# Patient Record
Sex: Male | Born: 1935
Health system: Southern US, Community
[De-identification: ages and names within clinical notes are randomized; demographics above are authoritative.]

## PROBLEM LIST (undated history)

## (undated) DIAGNOSIS — I2510 Atherosclerotic heart disease of native coronary artery without angina pectoris: Secondary | ICD-10-CM

## (undated) DIAGNOSIS — I471 Supraventricular tachycardia, unspecified: Secondary | ICD-10-CM

## (undated) DIAGNOSIS — Z9889 Other specified postprocedural states: Secondary | ICD-10-CM

## (undated) DIAGNOSIS — C801 Malignant (primary) neoplasm, unspecified: Secondary | ICD-10-CM

## (undated) DIAGNOSIS — D126 Benign neoplasm of colon, unspecified: Secondary | ICD-10-CM

## (undated) DIAGNOSIS — M109 Gout, unspecified: Secondary | ICD-10-CM

## (undated) DIAGNOSIS — Z9049 Acquired absence of other specified parts of digestive tract: Secondary | ICD-10-CM

## (undated) DIAGNOSIS — C4491 Basal cell carcinoma of skin, unspecified: Secondary | ICD-10-CM

## (undated) DIAGNOSIS — K589 Irritable bowel syndrome without diarrhea: Secondary | ICD-10-CM

## (undated) DIAGNOSIS — I10 Essential (primary) hypertension: Secondary | ICD-10-CM

## (undated) DIAGNOSIS — M1990 Unspecified osteoarthritis, unspecified site: Secondary | ICD-10-CM

## (undated) DIAGNOSIS — I219 Acute myocardial infarction, unspecified: Secondary | ICD-10-CM

## (undated) HISTORY — PX: CARDIAC CATHETERIZATION: SHX172

## (undated) HISTORY — DX: Benign neoplasm of colon, unspecified: D12.6

## (undated) HISTORY — PX: APPENDECTOMY: SHX54

## (undated) HISTORY — DX: Other specified postprocedural states: Z98.890

## (undated) HISTORY — PX: OTHER SURGICAL HISTORY: SHX169

## (undated) HISTORY — PX: CHOLECYSTECTOMY: SHX55

## (undated) HISTORY — DX: Acquired absence of other specified parts of digestive tract: Z90.49

---

## 1998-05-23 ENCOUNTER — Other Ambulatory Visit: Admission: RE | Admit: 1998-05-23 | Discharge: 1998-05-23 | Payer: Self-pay | Admitting: Urology

## 1999-06-17 ENCOUNTER — Encounter: Payer: Self-pay | Admitting: Family Medicine

## 1999-06-17 ENCOUNTER — Encounter: Admission: RE | Admit: 1999-06-17 | Discharge: 1999-06-17 | Payer: Self-pay | Admitting: Family Medicine

## 1999-07-07 ENCOUNTER — Encounter: Admission: RE | Admit: 1999-07-07 | Discharge: 1999-07-07 | Payer: Self-pay | Admitting: Orthopedic Surgery

## 1999-07-07 ENCOUNTER — Encounter: Payer: Self-pay | Admitting: Orthopedic Surgery

## 2002-06-14 ENCOUNTER — Encounter: Payer: Self-pay | Admitting: Family Medicine

## 2002-06-14 ENCOUNTER — Encounter: Admission: RE | Admit: 2002-06-14 | Discharge: 2002-06-14 | Payer: Self-pay | Admitting: Family Medicine

## 2003-08-03 ENCOUNTER — Ambulatory Visit (HOSPITAL_COMMUNITY): Admission: RE | Admit: 2003-08-03 | Discharge: 2003-08-03 | Payer: Self-pay | Admitting: Gastroenterology

## 2006-10-19 ENCOUNTER — Encounter: Admission: RE | Admit: 2006-10-19 | Discharge: 2006-10-19 | Payer: Self-pay | Admitting: Family Medicine

## 2006-10-22 ENCOUNTER — Encounter: Admission: RE | Admit: 2006-10-22 | Discharge: 2006-10-22 | Payer: Self-pay | Admitting: Family Medicine

## 2006-11-22 ENCOUNTER — Encounter (INDEPENDENT_AMBULATORY_CARE_PROVIDER_SITE_OTHER): Payer: Self-pay | Admitting: Specialist

## 2006-11-22 ENCOUNTER — Inpatient Hospital Stay (HOSPITAL_COMMUNITY): Admission: RE | Admit: 2006-11-22 | Discharge: 2006-11-25 | Payer: Self-pay | Admitting: Urology

## 2008-05-05 ENCOUNTER — Emergency Department (HOSPITAL_COMMUNITY): Admission: EM | Admit: 2008-05-05 | Discharge: 2008-05-05 | Payer: Self-pay | Admitting: Emergency Medicine

## 2008-05-18 ENCOUNTER — Encounter: Admission: RE | Admit: 2008-05-18 | Discharge: 2008-05-18 | Payer: Self-pay | Admitting: Family Medicine

## 2008-08-20 DIAGNOSIS — D126 Benign neoplasm of colon, unspecified: Secondary | ICD-10-CM

## 2008-08-20 HISTORY — DX: Benign neoplasm of colon, unspecified: D12.6

## 2008-11-24 ENCOUNTER — Emergency Department (HOSPITAL_COMMUNITY): Admission: EM | Admit: 2008-11-24 | Discharge: 2008-11-24 | Payer: Self-pay | Admitting: Emergency Medicine

## 2009-02-28 ENCOUNTER — Encounter: Admission: RE | Admit: 2009-02-28 | Discharge: 2009-04-04 | Payer: Self-pay | Admitting: Family Medicine

## 2010-04-21 ENCOUNTER — Inpatient Hospital Stay (HOSPITAL_COMMUNITY): Admission: RE | Admit: 2010-04-21 | Discharge: 2010-04-24 | Payer: Self-pay | Admitting: Interventional Cardiology

## 2010-06-17 ENCOUNTER — Ambulatory Visit (HOSPITAL_COMMUNITY)
Admission: RE | Admit: 2010-06-17 | Discharge: 2010-06-18 | Payer: Self-pay | Source: Home / Self Care | Admitting: Interventional Cardiology

## 2010-06-27 ENCOUNTER — Emergency Department (HOSPITAL_COMMUNITY)
Admission: EM | Admit: 2010-06-27 | Discharge: 2010-06-27 | Payer: Self-pay | Source: Home / Self Care | Admitting: Emergency Medicine

## 2010-09-30 LAB — BASIC METABOLIC PANEL
BUN: 7 mg/dL (ref 6–23)
CO2: 33 mEq/L — ABNORMAL HIGH (ref 19–32)
Calcium: 8.8 mg/dL (ref 8.4–10.5)
Chloride: 98 mEq/L (ref 96–112)
Creatinine, Ser: 0.97 mg/dL (ref 0.4–1.5)
GFR calc Af Amer: >60 mL/min (ref >60)
GFR calc non Af Amer: >60 mL/min (ref >60)
Glucose, Bld: 100 mg/dL — ABNORMAL HIGH (ref 70–99)
Potassium: 3.9 mEq/L (ref 3.5–5.1)
Sodium: 135 mEq/L (ref 135–145)

## 2010-09-30 LAB — CBC
HCT: 35.8 % — ABNORMAL LOW (ref 39.0–52.0)
Hemoglobin: 12 g/dL — ABNORMAL LOW (ref 13.0–17.0)
MCH: 30.5 pg (ref 26.0–34.0)
MCHC: 33.5 g/dL (ref 30.0–36.0)
MCV: 91.1 fL (ref 78.0–100.0)
Platelets: 133 10*3/uL — ABNORMAL LOW (ref 150–400)
RBC: 3.93 MIL/uL — ABNORMAL LOW (ref 4.22–5.81)
RDW: 13.6 % (ref 11.5–15.5)
WBC: 6.1 10*3/uL (ref 4.0–10.5)

## 2010-10-02 LAB — BRAIN NATRIURETIC PEPTIDE: Pro B Natriuretic peptide (BNP): 270 pg/mL — ABNORMAL HIGH (ref 0.0–100.0)

## 2010-10-02 LAB — LIPID PANEL
Cholesterol: 112 mg/dL (ref 0–200)
HDL: 40 mg/dL (ref >39)
LDL Cholesterol: 58 mg/dL (ref 0–99)
Total CHOL/HDL Ratio: 2.8 RATIO
Triglycerides: 69 mg/dL (ref <150)
VLDL: 14 mg/dL (ref 0–40)

## 2010-10-02 LAB — CBC
HCT: 38.2 % — ABNORMAL LOW (ref 39.0–52.0)
HCT: 40.2 % (ref 39.0–52.0)
Hemoglobin: 12.7 g/dL — ABNORMAL LOW (ref 13.0–17.0)
Hemoglobin: 13.3 g/dL (ref 13.0–17.0)
MCH: 30.2 pg (ref 26.0–34.0)
MCH: 30.2 pg (ref 26.0–34.0)
MCHC: 33.1 g/dL (ref 30.0–36.0)
MCHC: 33.2 g/dL (ref 30.0–36.0)
MCV: 91 fL (ref 78.0–100.0)
MCV: 91.4 fL (ref 78.0–100.0)
Platelets: 129 10*3/uL — ABNORMAL LOW (ref 150–400)
Platelets: 137 10*3/uL — ABNORMAL LOW (ref 150–400)
RBC: 4.2 MIL/uL — ABNORMAL LOW (ref 4.22–5.81)
RBC: 4.4 MIL/uL (ref 4.22–5.81)
RDW: 13.5 % (ref 11.5–15.5)
RDW: 13.5 % (ref 11.5–15.5)
WBC: 7.1 10*3/uL (ref 4.0–10.5)
WBC: 7.3 10*3/uL (ref 4.0–10.5)

## 2010-10-02 LAB — BASIC METABOLIC PANEL
BUN: 15 mg/dL (ref 6–23)
BUN: 16 mg/dL (ref 6–23)
CO2: 32 mEq/L (ref 19–32)
CO2: 33 mEq/L — ABNORMAL HIGH (ref 19–32)
Calcium: 8.7 mg/dL (ref 8.4–10.5)
Calcium: 8.9 mg/dL (ref 8.4–10.5)
Chloride: 98 mEq/L (ref 96–112)
Chloride: 98 mEq/L (ref 96–112)
Creatinine, Ser: 0.78 mg/dL (ref 0.4–1.5)
Creatinine, Ser: 0.79 mg/dL (ref 0.4–1.5)
GFR calc Af Amer: >60 mL/min (ref >60)
GFR calc Af Amer: >60 mL/min (ref >60)
GFR calc non Af Amer: >60 mL/min (ref >60)
GFR calc non Af Amer: >60 mL/min (ref >60)
Glucose, Bld: 94 mg/dL (ref 70–99)
Glucose, Bld: 99 mg/dL (ref 70–99)
Potassium: 3.1 mEq/L — ABNORMAL LOW (ref 3.5–5.1)
Potassium: 3.3 mEq/L — ABNORMAL LOW (ref 3.5–5.1)
Sodium: 137 mEq/L (ref 135–145)
Sodium: 139 mEq/L (ref 135–145)

## 2010-10-02 LAB — POCT I-STAT, CHEM 8
BUN: 18 mg/dL (ref 6–23)
Calcium, Ion: 1.16 mmol/L (ref 1.12–1.32)
Chloride: 98 mEq/L (ref 96–112)
Creatinine, Ser: 0.8 mg/dL (ref 0.4–1.5)
Glucose, Bld: 147 mg/dL — ABNORMAL HIGH (ref 70–99)
HCT: 42 % (ref 39.0–52.0)
Hemoglobin: 14.3 g/dL (ref 13.0–17.0)
Potassium: 3.4 mEq/L — ABNORMAL LOW (ref 3.5–5.1)
Sodium: 139 mEq/L (ref 135–145)
TCO2: 30 mmol/L (ref 0–100)

## 2010-10-02 LAB — CARDIAC PANEL(CRET KIN+CKTOT+MB+TROPI)
CK, MB: 111.3 ng/mL (ref 0.3–4.0)
CK, MB: 151.4 ng/mL (ref 0.3–4.0)
Relative Index: 8.9 — ABNORMAL HIGH (ref 0.0–2.5)
Relative Index: 9.3 — ABNORMAL HIGH (ref 0.0–2.5)
Total CK: 1254 U/L — ABNORMAL HIGH (ref 7–232)
Total CK: 1636 U/L — ABNORMAL HIGH (ref 7–232)
Troponin I: 46.68 ng/mL (ref 0.00–0.06)
Troponin I: 91.72 ng/mL (ref 0.00–0.06)

## 2010-10-02 LAB — COMPREHENSIVE METABOLIC PANEL
ALT: 41 U/L (ref 0–53)
AST: 99 U/L — ABNORMAL HIGH (ref 0–37)
Albumin: 3.3 g/dL — ABNORMAL LOW (ref 3.5–5.2)
Alkaline Phosphatase: 62 U/L (ref 39–117)
BUN: 10 mg/dL (ref 6–23)
CO2: 35 mEq/L — ABNORMAL HIGH (ref 19–32)
Calcium: 8.8 mg/dL (ref 8.4–10.5)
Chloride: 97 mEq/L (ref 96–112)
Creatinine, Ser: 0.89 mg/dL (ref 0.4–1.5)
GFR calc Af Amer: >60 mL/min (ref >60)
GFR calc non Af Amer: >60 mL/min (ref >60)
Glucose, Bld: 99 mg/dL (ref 70–99)
Potassium: 3.5 mEq/L (ref 3.5–5.1)
Sodium: 138 mEq/L (ref 135–145)
Total Bilirubin: 1.3 mg/dL — ABNORMAL HIGH (ref 0.3–1.2)
Total Protein: 5.8 g/dL — ABNORMAL LOW (ref 6.0–8.3)

## 2010-10-02 LAB — PROTIME-INR
INR: 1.06 (ref 0.00–1.49)
Prothrombin Time: 14 seconds (ref 11.6–15.2)

## 2010-10-02 LAB — MRSA PCR SCREENING: MRSA by PCR: NEGATIVE

## 2010-12-05 NOTE — Op Note (Signed)
NAME:  Jeffrey Zimmerman, Jeffrey Zimmerman                          ACCOUNT NO.:  [id number]   MEDICAL RECORD NO.:  [id number]                   PATIENT TYPE:  AMB   LOCATION:  ENDO                                 FACILITY:  [facility]   PHYSICIAN:  James L. Malon Kindle., M.D.          DATE OF BIRTH:  22-Feb-1936   DATE OF PROCEDURE:  08/03/2003  DATE OF DISCHARGE:                                 OPERATIVE REPORT   PROCEDURE PERFORMED:  Colonoscopy.   ENDOSCOPIST:  Llana Aliment. Edwards, M.D.   MEDICATIONS:  Fentanyl 70 mcg, Versed 7 mg IV.   INSTRUMENT USED:  Pediatric Olympus video colonoscope.   INDICATIONS FOR PROCEDURE:  Strong family history of colon cancer.  Father  died of colon cancer __________   DESCRIPTION OF PROCEDURE:  The procedure had been explained to the patient  and consent obtained.  With the patient in the left lateral decubitus  position, the pediatric adjustable Olympus video colonoscope was inserted  and advanced.  The prep was excellent and we were able to reach the cecum.  The ileocecal valve and appendiceal orifice were seen.  The scope was  withdrawn and the cecum, ascending colon, transverse colon, descending and  sigmoid colon were seen well.  No polyps or other lesions were seen.  The  scope was withdrawn.  The patient tolerated the procedure well.   ASSESSMENT:  Strong family history of colon cancer, B16.0 with negative exam  this year.   PLAN:  Will recommend yearly Hemoccults, repeat colonoscopy in five years.                                               James L. Malon Kindle., M.D.    Waldron Session  D:  08/03/2003  T:  08/03/2003  Job:  [id number]   cc:   Bryan Lemma. Manus Gunning, M.D.  [address] [place]  [place] [address]  Fax: [phone]

## 2010-12-05 NOTE — Op Note (Signed)
NAME:  Jeffrey Zimmerman, Jeffrey Zimmerman                ACCOUNT NO.:  [id number]   MEDICAL RECORD NO.:  [id number]          PATIENT TYPE:  INP   LOCATION:  0008                         FACILITY:  [facility]   PHYSICIAN:  Sigmund I. Patsi Sears, M.D.DATE OF BIRTH:  08/30/1935   DATE OF PROCEDURE:  11/22/2006  DATE OF DISCHARGE:                               OPERATIVE REPORT   SERVICE:  Urology.   PREOPERATIVE DIAGNOSIS:  Left renal mass.   POSTOPERATIVE DIAGNOSIS:  Left renal mass.   PROCEDURE:  1. Left partial nephrectomy.  2. Insertion of subcutaneous pain pump.   SURGEON:  Sigmund I. Patsi Sears, M.D.   ASSISTANT:  Terie Purser, MD   ANESTHESIA:  General.   SPECIMENS:  1. Left renal mass for pathologic analysis.  2. Base of tumor for frozen section, which was negative.   ESTIMATED BLOOD LOSS:  200 cc.   COMPLICATIONS:  None.   DRAINS:  16-French Foley catheter.   DISPOSITION:  Stable to post anesthesia care unit.   INDICATIONS:  The patient is a 75 year old male who has a incidental  finding of left renal mass on workup for elevated LFTs.  He has a 4 cm  solid enhancing mass involving the posterior aspect of the upper pole of  the left kidney.  He was counseled regarding need for surgical  intervention and has agreed to proceed with left partial nephrectomy  after full discussion of benefits and risks.  His consent was obtained.   DESCRIPTION OF PROCEDURE:  The patient was brought to the operating room  and was properly identified.  Time-out was performed to confirm correct  patient, procedure and side.  He was administered general anesthesia and  given preoperative antibiotic and then placed in the supine position  with a bump under the left side.  He was then prepped and draped in the  usual sterile manner.  We elected to proceed with a subcostal incision  on the left side.  Incision was made and the dissection carried down  through the subcutaneous tissue.  Muscle layers were divided and  the  peritoneum was entered.  There were no anterior adhesions noted.  We  then placed the Omni-Tract retractor.  There were adhesions on the  posterolateral aspect of the abdomen and these were taken down sharply.  There were dense adhesions with the spleen.  The mass was in close  proximity to the spleen.  These adhesions were taken down sharply to  carefully free the spleen from our dissection.  The spleen did not  appear to be injured during this.  We then proceeded with mobilization  of the kidney posteriorly as well as anteriorly.  We then came to the  inferior portion of the kidney and identified the ureter as it was  crossing over the psoas muscle.  It's position was noted.  We then  proceeded to continue dissection anteriorly and encountered the renal  vein and palpably identified the renal artery.  We then proceeded to  continue mobilizing the kidney within Gerota fascia.  We encountered the  tumor in the posterior aspect of the upper  pole.  The surrounding  perinephric fat on the normal aspects of the kidney was removed, leaving  the tumor and overlying perinephric fat.  We then scored the kidney  using the Bovie in a circumferential fashion around the tumor.  The  tumor was then removed with a combination of sharp and blunt dissection.  Pressure was held on the kidney for hemostasis.  There were 2 bleeding  points which were controlled with a figure-of-eight 2-0 Vicryl suture.  We then used the argon beam coagulator to fulgurate the tumor bed after  a section of the tumor base was sent for frozen analysis;  this was  negative.  After argon beam treatment we placed FloSeal and Surgicel.  Hemostasis was adequate at this point.  We then elected to place Tisseel  and a piece of Gelfoam on the resection site.  The overlying perinephric  fat was then oversewn over the top of the kidney.  The wound was  carefully irrigated and hemostasis was excellent.  We carefully  reinspected the  spleen which did not appear to be injured.  The kidney  was then returned to it's anatomic position and we proceeded to close.  The incision was closed in 2 layers using #1 PDS.  In the process of  closing the incision we placed a subcutaneous pain pump with one  catheter placed below the external fascia and another in the  subcutaneous portion of the wound.  The wound was then carefully  irrigated and the skin was closed with staples.  Sterile dressing was  applied.  The patient was then awoken from anesthesia and transported to  recovery room in stable condition.  There was no complications.  Please  note Dr. Patsi Sears was present and participated in all aspects of this  procedure.  All sponge and needle counts were correct x2 at the end of  the case.     ______________________________  Terie Purser, MD      Sigmund I. Patsi Sears, M.D.  Electronically Signed    JH/MEDQ  D:  11/22/2006  T:  11/22/2006  Job:  [id number]   cc:   Vonzell Schlatter. Patsi Sears, M.D.  Fax: [phone]

## 2010-12-05 NOTE — Discharge Summary (Signed)
NAME:  Jeffrey Zimmerman, Jeffrey Zimmerman                ACCOUNT NO.:  [id number]   MEDICAL RECORD NO.:  [id number]          PATIENT TYPE:  INP   LOCATION:  1404                         FACILITY:  [facility]   PHYSICIAN:  Sigmund I. Patsi Sears, M.D.DATE OF BIRTH:  [birth year]   DATE OF ADMISSION:  11/22/2006  DATE OF DISCHARGE:  11/25/2006                               DISCHARGE SUMMARY   ADMISSION DIAGNOSIS:  Left renal mass.   DISCHARGE DIAGNOSIS:  Left renal mass.   PROCEDURE:  Left open partial nephrectomy on Nov 22, 2006.   HISTORY OF PRESENT ILLNESS:  For full details, please see dictated  history and physical. Briefly, Jeffrey Zimmerman is a 75 year old gentleman who  has an incidental finding of a 4-cm enhancing mass involving the upper  pole of his left kidney which was found on workup for elevated LFTs. He  is admitted for partial nephrectomy.   HOSPITAL COURSE:  The patient was brought to the hospital on the day of  the procedure and taken to the operating room for left partial  nephrectomy. He tolerated this well and was transferred to the floor.  Once on the floor, he had a subcutaneous pain pump which was inserted in  surgery for adjuvant pain control. His postoperative course was  uncomplicated. His diet was slowly advanced. He ambulated without  difficulty and was able to be transitioned to oral analgesics without  difficulty and was deemed stable for discharge on postoperative day #3.  At the time of discharge, he was afebrile with stable vital signs. He  has had 2 bowel movements. His heart was regular. His lungs were clear.  His abdomen was soft and nontender. His surgical incision was clean, dry  and intact with staples in place.   DISPOSITION AND DISCHARGE INSTRUCTIONS:  Jeffrey Zimmerman is discharged to  home in stable condition. His condition is improved. He was instructed  to resume his previous diet. Activity level should be to increase  activity slowly, and he may shower. He is instructed not  to lift for 12  weeks or drive for 4 weeks. He was instructed to call with any questions  or concerns regarding his care. Specifically, any fevers greater than  101 or uncontrolled pain, nausea, vomiting, or redness or drainage from  his incision. Also with any other questions he may have. He understood  and agreed with these instructions.   MEDICATIONS AT DISCHARGE:  1. Vicodin as needed for pain control, #40 with no refills given.  2. Colace 100 mg p.o. b.i.d. as needed for constipation.  3. Triamterene hydrochlorothiazide 37.5/25 daily as before.  4. Atenolol 25 mg daily as before.  5. Lanoxin 250 mcg daily as before.   FOLLOW UP:  Will be with Dr. Patsi Sears in one week for staple removal.  The patient will call [phone] for an appointment.     ______________________________  Terie Purser, MD      Sigmund I. Patsi Sears, M.D.  Electronically Signed    JH/MEDQ  D:  11/25/2006  T:  11/25/2006  Job:  [id number]

## 2010-12-05 NOTE — H&P (Signed)
NAME:  Jeffrey Zimmerman, Jeffrey Zimmerman                ACCOUNT NO.:  [id number]   MEDICAL RECORD NO.:  [id number]          PATIENT TYPE:  INP   LOCATION:  0008                         FACILITY[facility]   PHYSICIAN:  Sigmund I. Patsi Sears, M.D.DATE OF BIRTH:  1936/01/06   DATE OF ADMISSION:  11/22/2006  DATE OF DISCHARGE:                              HISTORY & PHYSICAL   SERVICE:  Urology.   ADMITTING PHYSICIAN:  Sigmund I. Patsi Sears, M.D.   CHIEF COMPLAINT:  Renal mass.   HISTORY OF PRESENT ILLNESS:  Jeffrey Zimmerman is a 75 year old male who has a  history of incidental finding of 4 cm enhancing mass involving the upper  pole of the left kidney.  This was found on workup for elevated LFTs.  He has been asymptomatic without flank pain or hematuria.   ALLERGIES:  CODEINE.   MEDICATIONS:  1. Lanoxin  2. Triamterene/hydrochlorothiazide.  3. Atenolol.   PAST SURGICAL HISTORY:  Cholecystectomy and appendectomy.   FAMILY HISTORY:  Significant for cancer in his father and heart disease  in mother.   SOCIAL HISTORY:  The patient is married.  He is a retired Copy.  Denies any alcohol or tobacco use.   REVIEW OF SYSTEMS:  Multisystem review is performed.  The patient does  admit to weight loss, itching, sinusitis, bruising, occasional  constipation, dizziness and anxiety.   PHYSICAL EXAMINATION:  VITAL SIGNS:  Temperature is 98, heart rate 63,  blood pressure 151/78, weight 71 kg  GENERAL: Well-developed, well-nourished male in no acute distress.  HEENT:  Normocephalic and atraumatic.  NECK:  Supple without lymphadenopathy.  CARDIOVASCULAR:  Regular rate and rhythm.  PULMONARY: Clear to auscultation bilaterally.  ABDOMEN: Soft, nontender, nondistended.  There is a well-healed right  subcostal incision.  GU: Exam reveals normal phallus.  Both testicles descended.  RECTAL:  Exam is deferred at this time.  EXTREMITIES:  No sinus clubbing, edema.  PSYCH:  Alert and oriented x3.   IMAGING STUDIES:   Review of the CT of the abdomen and pelvis revealed  approximately 4 cm enhancing mass involving the posterior aspect of the  upper pole of the left kidney concerning for renal cell carcinoma.   ASSESSMENT/PLAN:  A 75 year old male with enhancing renal mass  concerning for renal cell carcinoma.   PLAN:  1. The patient be taken to operating room for left partial      nephrectomy.  2. He will be admitted postoperatively for routine care.     ______________________________  Terie Purser, MD      Sigmund I. Patsi Sears, M.D.  Electronically Signed    JH/MEDQ  D:  11/22/2006  T:  11/22/2006  Job:  [id number]

## 2011-12-31 ENCOUNTER — Encounter (HOSPITAL_COMMUNITY): Payer: Self-pay | Admitting: *Deleted

## 2011-12-31 ENCOUNTER — Emergency Department (HOSPITAL_COMMUNITY): Payer: PRIVATE HEALTH INSURANCE

## 2011-12-31 ENCOUNTER — Emergency Department (HOSPITAL_COMMUNITY)
Admission: EM | Admit: 2011-12-31 | Discharge: 2011-12-31 | Disposition: A | Discharge status: Home / Self Care | Payer: PRIVATE HEALTH INSURANCE | Attending: Emergency Medicine | Admitting: Emergency Medicine

## 2011-12-31 DIAGNOSIS — S5010XA Contusion of unspecified forearm, initial encounter: Secondary | ICD-10-CM | POA: Insufficient documentation

## 2011-12-31 DIAGNOSIS — T148XXA Other injury of unspecified body region, initial encounter: Secondary | ICD-10-CM

## 2011-12-31 DIAGNOSIS — I2510 Atherosclerotic heart disease of native coronary artery without angina pectoris: Secondary | ICD-10-CM | POA: Insufficient documentation

## 2011-12-31 DIAGNOSIS — Y9301 Activity, walking, marching and hiking: Secondary | ICD-10-CM | POA: Insufficient documentation

## 2011-12-31 DIAGNOSIS — M109 Gout, unspecified: Secondary | ICD-10-CM | POA: Insufficient documentation

## 2011-12-31 DIAGNOSIS — I10 Essential (primary) hypertension: Secondary | ICD-10-CM | POA: Insufficient documentation

## 2011-12-31 DIAGNOSIS — Z85038 Personal history of other malignant neoplasm of large intestine: Secondary | ICD-10-CM | POA: Insufficient documentation

## 2011-12-31 DIAGNOSIS — Y998 Other external cause status: Secondary | ICD-10-CM | POA: Insufficient documentation

## 2011-12-31 DIAGNOSIS — Y92009 Unspecified place in unspecified non-institutional (private) residence as the place of occurrence of the external cause: Secondary | ICD-10-CM | POA: Insufficient documentation

## 2011-12-31 DIAGNOSIS — I252 Old myocardial infarction: Secondary | ICD-10-CM | POA: Insufficient documentation

## 2011-12-31 DIAGNOSIS — W2209XA Striking against other stationary object, initial encounter: Secondary | ICD-10-CM | POA: Insufficient documentation

## 2011-12-31 HISTORY — DX: Irritable bowel syndrome, unspecified: K58.9

## 2011-12-31 HISTORY — DX: Acute myocardial infarction, unspecified: I21.9

## 2011-12-31 HISTORY — DX: Malignant (primary) neoplasm, unspecified: C80.1

## 2011-12-31 HISTORY — DX: Supraventricular tachycardia, unspecified: I47.10

## 2011-12-31 HISTORY — DX: Supraventricular tachycardia: I47.1

## 2011-12-31 HISTORY — DX: Essential (primary) hypertension: I10

## 2011-12-31 HISTORY — DX: Basal cell carcinoma of skin, unspecified: C44.91

## 2011-12-31 HISTORY — DX: Atherosclerotic heart disease of native coronary artery without angina pectoris: I25.10

## 2011-12-31 HISTORY — DX: Gout, unspecified: M10.9

## 2011-12-31 MED ORDER — ACETAMINOPHEN 325 MG PO TABS
975.0000 mg | ORAL_TABLET | Freq: Once | ORAL | Status: AC
  Administered 2011-12-31: 975 mg via ORAL
  Filled 2011-12-31: qty 3

## 2011-12-31 NOTE — ED Notes (Addendum)
Pt states he stubbed toe this am causing him to fall forward and hit left lateral aspect of arm on glass, pt with abrasion noted and swelling. No deformity to forearm. Pt with ice pack from home on arm.

## 2011-12-31 NOTE — ED Provider Notes (Signed)
History     CSN: [id number]  Arrival date & time 12/31/11  4098   First MD Initiated Contact with Patient 12/31/11 [phone]      Chief Complaint  Patient presents with  . Fall    (Consider location/radiation/quality/duration/timing/severity/associated sxs/prior treatment) HPI  Patient presents to ER by taxi complaining of left forearm injury just PTA stating he was carrying a large box on back porch and tripped walking into house causing him to fall forward and catch himself on window frame with left forearm causing pain, abrasion and swelling of forearm. He took nothing for pain PTA. Denies additional injury. Denies hitting head or LOC. Patient states he was concerned about the "instant swelling and big bruise." denies pain in left hand, wrist or elbow. Pain aggravated by touch. Patient states he tetanus was updated with in the last 1-2 years.  Past Medical History  Diagnosis Date  . Hypertension   . Coronary artery disease   . Myocardial infarct   . Cancer     colon  . Basal cell carcinoma   . Gout   . Irritable bowel syndrome   . SVT (supraventricular tachycardia)     History reviewed. No pertinent past surgical history.  History reviewed. No pertinent family history.  History  Substance Use Topics  . Smoking status: Never Smoker   . Smokeless tobacco: Not on file  . Alcohol Use: No      Review of Systems  All other systems reviewed and are negative.    Allergies  Codeine  Home Medications   Current Outpatient Rx  Name Route Sig Dispense Refill  . ASPIRIN EC 81 MG PO TBEC Oral Take 81 mg by mouth daily.    . ATENOLOL 25 MG PO TABS Oral Take 25 mg by mouth daily.    Marland Kitchen LISINOPRIL 20 MG PO TABS Oral Take 20 mg by mouth daily.    Marland Kitchen TICAGRELOR 90 MG PO TABS Oral Take 90 mg by mouth 2 (two) times daily.    . TRIAMTERENE-HCTZ 75-50 MG PO TABS Oral Take 0.5 tablets by mouth every other day.      BP 154/97  Pulse 67  Temp 98.4 F (36.9 C) (Oral)  Resp 17   SpO2 100%  Physical Exam  Nursing note and vitals reviewed. Constitutional: He is oriented to person, place, and time. He appears well-developed.  HENT:  Head: Normocephalic and atraumatic.  Eyes: Conjunctivae are normal.  Neck: Normal range of motion. Neck supple.  Cardiovascular: Normal rate.   Pulmonary/Chest: Effort normal.  Musculoskeletal: Normal range of motion. He exhibits edema and tenderness.       Superficial abrasion and hematoma of left posterior forearm with mild TTP of entire forearm but FROM of wrist and elbow with no pain. No deformity. 5/5 strength of LUE. Normal radial pulse, cap refill and sensation.   Neurological: He is alert and oriented to person, place, and time.  Skin: Skin is warm and dry. No rash noted. No erythema. No pallor.       Hematoma with abrasions of left posterior forearm. Mild TTP.     ED Course  Procedures (including critical care time)  PO tylenol  Labs Reviewed - No data to display Dg Forearm Left  12/31/2011  *RADIOLOGY REPORT*  Clinical Data: Fall.  Laceration.  LEFT FOREARM - 2 VIEW  Comparison: None.  Findings: Soft tissues along the mid forearm appears swollen on the ulnar side compatible with laceration.  No fracture, dislocation or radiopaque foreign  body is identified. First CMC osteoarthritis is noted.  Fat density seen projecting over the anterior aspect of the distal humerus may be a lipoma. It is incompletely visualized.  IMPRESSION: Laceration.  Negative for fracture or foreign body.  Original Report Authenticated By: Bernadene Bell. D'ALESSIO, M.D.     1. Traumatic hematoma of forearm   2. Abrasion       MDM  No underlying fx with hematoma. No TTP of entire wrist and elbow with FROM and 5/5 strength. No other injury noted. LUE neuro vasc intact.         [place], [place] 12/31/11 1055

## 2011-12-31 NOTE — Discharge Instructions (Signed)
Keep wound clean with mild soap and water. Ice to reduce swelling and pain. Take over the counter tylenol for pain. Follow up with Dr. Manus Gunning in one week for recheck.  Abrasions Abrasions are skin scrapes. Their treatment depends on how large and deep the abrasion is. Abrasions do not extend through all layers of the skin. A cut or lesion through all skin layers is called a laceration. HOME CARE INSTRUCTIONS   If you were given a dressing, change it at least once a day or as instructed by your caregiver. If the bandage sticks, soak it off with a solution of water or hydrogen peroxide.   Twice a day, wash the area with soap and water to remove all the cream/ointment. You may do this in a sink, under a tub faucet, or in a shower. Rinse off the soap and pat dry with a clean towel. Look for signs of infection (see below).   Reapply cream/ointment according to your caregiver's instruction. This will help prevent infection and keep the bandage from sticking. Telfa or gauze over the wound and under the dressing or wrap will also help keep the bandage from sticking.   If the bandage becomes wet, dirty, or develops a foul smell, change it as soon as possible.   Only take over-the-counter or prescription medicines for pain, discomfort, or fever as directed by your caregiver.  SEEK IMMEDIATE MEDICAL CARE IF:   Increasing pain in the wound.   Signs of infection develop: redness, swelling, surrounding area is tender to touch, or pus coming from the wound.   You have a fever.   Any foul smell coming from the wound or dressing.  Most skin wounds heal within ten days. Facial wounds heal faster. However, an infection may occur despite proper treatment. You should have the wound checked for signs of infection within 24 to 48 hours or sooner if problems arise. If you were not given a wound-check appointment, look closely at the wound yourself on the second day for early signs of infection listed above. MAKE  SURE YOU:   Understand these instructions.   Will watch your condition.   Will get help right away if you are not doing well or get worse.  Document Released: 04/15/2005 Document Revised: 06/25/2011 Document Reviewed: 06/09/2011 [facility] Patient Information 2012 [place], [place].  Hematoma A hematoma is a pocket of blood. The pocket of blood can turn into a hard, painful lump under the skin. Your skin may turn blue or yellow. Most hematomas get better in a few days to weeks. Some hematomas are serious and need medical care. Hematomas can be very small or very big. HOME CARE  Put ice on the injured area.   Put ice in a plastic bag.   Place a towel between your skin and the bag.   Leave the ice on for 15 to 20 minutes, 3 to 4 times a day for the first 1 to 2 days.   After the first 2 days, switch to using warm packs on the injured area.   Raise (elevate) the injured area to lessen pain and puffiness (swelling). You may also wrap the area with an elastic bandage. Make sure the bandage is not wrapped too tight.   If you have a painful hematoma on your leg or foot, you may use crutches for a couple days.   Only take medicines as told by your doctor.  GET HELP RIGHT AWAY IF:   Your pain gets worse.   Your  pain is not controlled with medicine.   You have a fever.   Your puffiness gets worse.   Your skin turns more blue or yellow.   Your skin over the hematoma breaks or starts bleeding.  MAKE SURE YOU:   Understand these instructions.   Will watch your condition.   Will get help right away if you are not doing well or get worse.  Document Released: 08/13/2004 Document Revised: 06/25/2011 Document Reviewed: 03/09/2011 [facility] Patient Information 2012 [place], [place].

## 2012-01-02 NOTE — ED Provider Notes (Signed)
Medical screening examination/treatment/procedure(s) were performed by non-physician practitioner and as supervising physician I was immediately available for consultation/collaboration.   Carleene Cooper III, MD 01/02/12 1136

## 2012-02-12 ENCOUNTER — Emergency Department (HOSPITAL_COMMUNITY)
Admission: EM | Admit: 2012-02-12 | Discharge: 2012-02-12 | Disposition: A | Discharge status: Home Health | Payer: PRIVATE HEALTH INSURANCE | Source: Home / Self Care | Attending: Emergency Medicine | Admitting: Emergency Medicine

## 2012-02-12 ENCOUNTER — Encounter (HOSPITAL_COMMUNITY): Payer: Self-pay

## 2012-02-12 DIAGNOSIS — K05219 Aggressive periodontitis, localized, unspecified severity: Secondary | ICD-10-CM

## 2012-02-12 MED ORDER — HYDROCODONE-ACETAMINOPHEN 5-325 MG PO TABS
1.0000 | ORAL_TABLET | Freq: Once | ORAL | Status: AC
  Administered 2012-02-12: 1 via ORAL

## 2012-02-12 MED ORDER — OXYCODONE-ACETAMINOPHEN 5-325 MG PO TABS: 2.0000 | ORAL_TABLET | ORAL | Status: AC | PRN

## 2012-02-12 MED ORDER — HYDROCODONE-ACETAMINOPHEN 5-325 MG PO TABS
ORAL_TABLET | ORAL | Status: AC
  Filled 2012-02-12: qty 1

## 2012-02-12 MED ORDER — CLINDAMYCIN HCL 300 MG PO CAPS: 300.0000 mg | ORAL_CAPSULE | Freq: Four times a day (QID) | ORAL | Status: AC

## 2012-02-12 NOTE — ED Provider Notes (Signed)
History     CSN: [id number]  Arrival date & time 02/12/12  1517   First MD Initiated Contact with Patient 02/12/12 1549      Chief Complaint  Patient presents with  . Recurrent Skin Infections    (Consider location/radiation/quality/duration/timing/severity/associated sxs/prior treatment) Patient is a 76 y.o. male presenting with tooth pain. The history is provided by the patient.  Dental PainThe primary symptoms include mouth pain. Primary symptoms do not include fever, shortness of breath or cough. The symptoms began more than 1 week ago. The symptoms are worsening. The symptoms occur constantly.  Additional symptoms include: dental sensitivity to temperature, gum swelling, gum tenderness, purulent gums, trismus, jaw pain, facial swelling, trouble swallowing and swollen glands. Additional symptoms do not include: ear pain and fatigue. Associated symptoms comments: Foul odor. Medical issues include: periodontal disease. Medical issues do not include: alcohol problem and smoking.    Past Medical History  Diagnosis Date  . Hypertension   . Coronary artery disease   . Myocardial infarct   . Cancer     colon  . Basal cell carcinoma   . Gout   . Irritable bowel syndrome   . SVT (supraventricular tachycardia)     History reviewed. No pertinent past surgical history.  No family history on file.  History  Substance Use Topics  . Smoking status: Never Smoker   . Smokeless tobacco: Not on file  . Alcohol Use: No      Review of Systems  Constitutional: Negative for fever, chills, appetite change and fatigue.  HENT: Positive for facial swelling and trouble swallowing. Negative for ear pain, neck pain, neck stiffness, sinus pressure and tinnitus.   Respiratory: Negative for cough and shortness of breath.     Allergies  Codeine  Home Medications   Current Outpatient Rx  Name Route Sig Dispense Refill  . ASPIRIN EC 81 MG PO TBEC Oral Take 81 mg by mouth daily.    .  ATENOLOL 25 MG PO TABS Oral Take 25 mg by mouth daily.    Marland Kitchen CLINDAMYCIN HCL 300 MG PO CAPS Oral Take 1 capsule (300 mg total) by mouth QID. 40 capsule 0  . LISINOPRIL 20 MG PO TABS Oral Take 20 mg by mouth daily.    . OXYCODONE-ACETAMINOPHEN 5-325 MG PO TABS Oral Take 2 tablets by mouth every 4 (four) hours as needed for pain. 6 tablet 0  . TICAGRELOR 90 MG PO TABS Oral Take 90 mg by mouth 2 (two) times daily.    . TRIAMTERENE-HCTZ 75-50 MG PO TABS Oral Take 0.5 tablets by mouth every other day.      BP 162/89  Pulse 71  Temp 98.7 F (37.1 C) (Oral)  Resp 16  SpO2 99%  Physical Exam  Nursing note and vitals reviewed. Constitutional: He is oriented to person, place, and time. Vital signs are normal. He appears well-developed and well-nourished. He is active and cooperative.  HENT:  Head: Normocephalic. There is trismus in the jaw.  Right Ear: Hearing, tympanic membrane, external ear and ear canal normal.  Left Ear: Hearing, tympanic membrane, external ear and ear canal normal.  Nose: Nose normal. Right sinus exhibits no maxillary sinus tenderness. Left sinus exhibits no maxillary sinus tenderness.  Mouth/Throat: Oropharynx is clear and moist and mucous membranes are normal. Abnormal dentition. Dental abscesses and dental caries present. No uvula swelling. No posterior oropharyngeal edema, posterior oropharyngeal erythema or tonsillar abscesses.         Right lower molar tenderness  to touch, no floor of mouth swelling appreciated, tenderness across entire right lateral mandible, edema, open area draining pus-like inferiorly to mandible  Eyes: Conjunctivae are normal. Pupils are equal, round, and reactive to light. No scleral icterus.  Neck: Trachea normal. Neck supple.  Cardiovascular: Normal rate and regular rhythm.   Pulmonary/Chest: Effort normal and breath sounds normal.  Lymphadenopathy:       Head (right side): Submental and submandibular adenopathy present. No tonsillar, no  preauricular, no posterior auricular and no occipital adenopathy present.       Head (left side): No submental, no submandibular, no tonsillar, no preauricular, no posterior auricular and no occipital adenopathy present.    He has no cervical adenopathy.  Neurological: He is alert and oriented to person, place, and time. No cranial nerve deficit or sensory deficit.  Skin: Skin is warm and dry.  Psychiatric: He has a normal mood and affect. His speech is normal and behavior is normal. Judgment and thought content normal. Cognition and memory are normal.    ED Course  Procedures (including critical care time)  Labs Reviewed - No data to display No results found.   1. Periodontal abscess       MDM  1759  Patient discussed with Dr. Felton Clinton, instructed to treat patient with oral antibiotics, will see in office on Monday to remove tooth.        Johnsie Kindred, NP 02/12/12 1817

## 2012-02-12 NOTE — ED Provider Notes (Signed)
Medical screening examination/treatment/procedure(s) were performed by non-physician practitioner and as supervising physician I was immediately available for consultation/collaboration.  Leslee Home, M.D.   Reuben Likes, MD 02/12/12 [phone]

## 2012-02-12 NOTE — ED Notes (Signed)
Pt c/o abcess to R jaw.  Pt states onset 1 week ago.  Pt feels as though it is a dental abcess.  Pt has open, draining abcess to R mandible.  Pt denies fever.  Pt states he is unable to chew food due to pain.

## 2012-06-06 ENCOUNTER — Other Ambulatory Visit: Payer: Self-pay | Admitting: Dermatology

## 2012-06-30 ENCOUNTER — Other Ambulatory Visit: Payer: Self-pay | Admitting: Dermatology

## 2013-05-09 ENCOUNTER — Telehealth: Payer: Self-pay

## 2013-05-09 NOTE — Telephone Encounter (Signed)
patient called wanting samples of brilinta 90 mg samples place at front to desk

## 2013-05-31 ENCOUNTER — Telehealth: Payer: Self-pay | Admitting: Interventional Cardiology

## 2013-05-31 ENCOUNTER — Telehealth: Payer: Self-pay | Admitting: *Deleted

## 2013-05-31 NOTE — Telephone Encounter (Signed)
Yes, please give pt samples. Thank you.

## 2013-05-31 NOTE — Telephone Encounter (Signed)
New message     Want samples of brilinta

## 2013-05-31 NOTE — Telephone Encounter (Signed)
Patient aware samples left at front desk for pick up. 

## 2013-06-12 ENCOUNTER — Telehealth: Payer: Self-pay | Admitting: Interventional Cardiology

## 2013-06-12 NOTE — Telephone Encounter (Signed)
Called pt to let him know that we do not have samples of Brilinta at this time. Pt has been supplied samples the whole time he has been on it. Pt states it will cost him over $100 if he has to get a prescription and he cant afford that price. Please advise.

## 2013-06-12 NOTE — Telephone Encounter (Signed)
New problem    Pt would like some sample of Brilinta  Samples please.  Call and let him know when ready

## 2013-06-12 NOTE — Telephone Encounter (Signed)
OK to switch to clopidogrel 75 mg daily from Brilinta.

## 2013-06-21 ENCOUNTER — Telehealth: Payer: Self-pay | Admitting: *Deleted

## 2013-06-21 MED ORDER — CLOPIDOGREL BISULFATE 75 MG PO TABS: 75.0000 mg | ORAL_TABLET | Freq: Every day | ORAL | Status: DC

## 2013-06-21 NOTE — Telephone Encounter (Signed)
Patient called for brilinta samples. I saw the note that Dr Eldridge Dace is putting him on plavix to replace this. Patient stated that he has taken this in the past and is fine with going back on it, but wants to know if he should still take aspirin with this as he is currently taking 81mg  daily. He wants this sent to [facility] but he will be out of brilinta in 2-3 days. Please advise. Thanks, MI

## 2013-06-21 NOTE — Telephone Encounter (Signed)
Per Dr. Eldridge Dace pt should stay on ASA 81 mg with Plavix. Pt notified. Rx's sent in to local pharmacy and mailorder.

## 2013-06-21 NOTE — Telephone Encounter (Deleted)
Sent in Plavix 75 [facility] to [facility]. I will check with Dr. Eldridge Dace regarding ASA.

## 2013-07-18 DIAGNOSIS — I471 Supraventricular tachycardia: Secondary | ICD-10-CM | POA: Insufficient documentation

## 2013-07-18 DIAGNOSIS — I2510 Atherosclerotic heart disease of native coronary artery without angina pectoris: Secondary | ICD-10-CM | POA: Insufficient documentation

## 2013-07-18 DIAGNOSIS — C4491 Basal cell carcinoma of skin, unspecified: Secondary | ICD-10-CM | POA: Insufficient documentation

## 2013-07-18 DIAGNOSIS — I10 Essential (primary) hypertension: Secondary | ICD-10-CM | POA: Insufficient documentation

## 2013-07-18 DIAGNOSIS — C801 Malignant (primary) neoplasm, unspecified: Secondary | ICD-10-CM | POA: Insufficient documentation

## 2013-07-18 DIAGNOSIS — M109 Gout, unspecified: Secondary | ICD-10-CM | POA: Insufficient documentation

## 2013-07-18 DIAGNOSIS — K589 Irritable bowel syndrome without diarrhea: Secondary | ICD-10-CM | POA: Insufficient documentation

## 2013-08-08 ENCOUNTER — Encounter: Payer: Self-pay | Admitting: Cardiology

## 2013-08-08 DIAGNOSIS — R5381 Other malaise: Secondary | ICD-10-CM

## 2013-08-08 DIAGNOSIS — R5383 Other fatigue: Principal | ICD-10-CM

## 2013-08-10 ENCOUNTER — Encounter (INDEPENDENT_AMBULATORY_CARE_PROVIDER_SITE_OTHER): Payer: Self-pay

## 2013-08-10 ENCOUNTER — Ambulatory Visit (INDEPENDENT_AMBULATORY_CARE_PROVIDER_SITE_OTHER): Payer: Medicare HMO | Admitting: Interventional Cardiology

## 2013-08-10 ENCOUNTER — Encounter: Payer: Self-pay | Admitting: Interventional Cardiology

## 2013-08-10 VITALS — BP 140/88 | HR 62 | Ht 71.0 in | Wt 145.8 lb

## 2013-08-10 DIAGNOSIS — I471 Supraventricular tachycardia: Secondary | ICD-10-CM

## 2013-08-10 DIAGNOSIS — I498 Other specified cardiac arrhythmias: Secondary | ICD-10-CM

## 2013-08-10 DIAGNOSIS — I252 Old myocardial infarction: Secondary | ICD-10-CM | POA: Insufficient documentation

## 2013-08-10 DIAGNOSIS — I10 Essential (primary) hypertension: Secondary | ICD-10-CM

## 2013-08-10 DIAGNOSIS — I2510 Atherosclerotic heart disease of native coronary artery without angina pectoris: Secondary | ICD-10-CM

## 2013-08-10 DIAGNOSIS — E782 Mixed hyperlipidemia: Secondary | ICD-10-CM | POA: Insufficient documentation

## 2013-08-10 NOTE — Patient Instructions (Signed)
Your physician recommends that you return for a FASTING lipid profile ON 08/14/13.  Your physician wants you to follow-up in: 9 Months  with Dr. Irish Lack.  You will receive a reminder letter in the mail two months in advance. If you don't receive a letter, please call our office to schedule the follow-up appointment.  Your physician recommends that you continue on your current medications as directed. Please refer to the Current Medication list given to you today.

## 2013-08-10 NOTE — Progress Notes (Signed)
Patient ID: Jeffrey Zimmerman, male   DOB: Nov 10, 1935, 78 y.o.   MRN: [id number]    [place], [place], [place]  [address] Phone: [phone] Fax:  [phone]  Date:  08/10/2013   ID:  Jeffrey Zimmerman, DOB [birth year], MRN [id number]  PCP:  Simona Huh, MD      History of Present Illness: Jeffrey Zimmerman is a 78 y.o. male who has multiple stents from 2011, anterior STEMI. He has done well. Still reports some fatigue. BP at home is in the 150/80 range. CAD/ASCVD:  does yard work, no symptoms. walks 3x/week. No chest pain. c/o Palpitations fluttering in the abdomen, lasting a few seconds- minutes.  Denies : Chest pain.  Diaphoresis.  Dizziness.  Dyspnea on exertion.  Leg edema.  Nitroglycerin.  Paroxysmal nocturnal dyspnea.  Syncope.     Wt Readings from Last 3 Encounters:  08/10/13 145 lb 12.8 oz (66.134 kg)     Past Medical History  Diagnosis Date  . Hypertension   . Myocardial infarct   . Cancer     colon  . Basal cell carcinoma   . Gout   . Irritable bowel syndrome   . SVT (supraventricular tachycardia)   . History of colonoscopy     1/5 AND 2/10  . History of cholecystectomy   . Coronary artery disease     04/21/10 Anter MI with BMS to LAD with residual proximal LAD EF 40%  . Adenomatous colon polyp 2/10    Current Outpatient Prescriptions  Medication Sig Dispense Refill  . amLODipine (NORVASC) 5 MG tablet Take 5 mg by mouth daily.       Marland Kitchen aspirin EC 81 MG tablet Take 81 mg by mouth daily.      Marland Kitchen atenolol (TENORMIN) 25 MG tablet Take 25 mg by mouth daily.      . clopidogrel (PLAVIX) 75 MG tablet Take 1 tablet (75 mg total) by mouth daily.  10 tablet  0  . digoxin (LANOXIN) 0.25 MG tablet Take 0.25 mg by mouth daily.      Marland Kitchen lisinopril (PRINIVIL,ZESTRIL) 20 MG tablet Take 20 mg by mouth daily.      . nitroGLYCERIN (NITROSTAT) 0.4 MG SL tablet Place 0.4 mg under the tongue every 5 (five) minutes as needed for chest pain.      . pravastatin  (PRAVACHOL) 20 MG tablet Take 20 mg by mouth daily.      Marland Kitchen triamterene-hydrochlorothiazide (MAXZIDE) 75-50 MG per tablet Take 0.5 tablets by mouth every other day.       No current facility-administered medications for this visit.    Allergies:    Allergies  Allergen Reactions  . Codeine Other (See Comments)    unknown    Social History:  The patient  reports that he has never smoked. He does not have any smokeless tobacco history on file. He reports that he does not drink alcohol or use illicit drugs.   Family History:  The patient's family history includes CAD in his mother; Colon cancer in his father; Heart attack in his mother; Rheum arthritis in his maternal grandfather.   ROS:  Please see the history of present illness.  No nausea, vomiting.  No fevers, chills.  No focal weakness.  No dysuria.  All other systems reviewed and negative.   PHYSICAL EXAM: VS:  BP 140/88  Pulse 62  Ht 5\' 11"  (1.803 m)  Wt 145 lb 12.8 oz (66.134 kg)  BMI 20.34 kg/m2  Well nourished, well developed, in no acute distress HEENT: normal Neck: no JVD, no carotid bruits Cardiac:  normal S1, S2; RRR;  Lungs:  clear to auscultation bilaterally, no wheezing, rhonchi or rales Abd: soft, nontender, no hepatomegaly Ext: bilateral, right ankle> left ankle-chronic; worse after fall  Skin: warm and dry Neuro:   no focal abnormalities noted  EKG:  NSR, prior septal MI    ASSESSMENT AND PLAN:  CAD in native artery  IMAGING: EC Stress Test [facility] (Ordered for 12/08/2012) Notes: He has some burning in chest and back. Now resolved. Negative  stress test in 7/14.   S/p anterior MI.  EF 61%. Septal hypokinesis.       2. Benign hypertension  Continue Amlodipine Besylate Tablet, 5 MG, 1 tablet, Orally, Once a day, 30 day(s), 30, Refills 11 Notes: blood pressure better today.   3. Hyperlipidemia: LDL 68 in 2013.  Needs recheck  4. Atrial tachycardia by monitor in 2013. Short runs.  Minimal sx at this time.    Preventive Medicine  Adult topics discussed:  Diet: healthy diet.  Exercise: 5 days a week, at least 30 minutes of aerobic exercise.    Follow Up 9 months     Signed, Mina Marble, MD, [facility] 08/10/2013 4:14 PM

## 2013-08-14 ENCOUNTER — Other Ambulatory Visit (INDEPENDENT_AMBULATORY_CARE_PROVIDER_SITE_OTHER): Payer: Medicare HMO

## 2013-08-14 DIAGNOSIS — I2510 Atherosclerotic heart disease of native coronary artery without angina pectoris: Secondary | ICD-10-CM

## 2013-08-14 LAB — LIPID PANEL
Cholesterol: 149 mg/dL (ref 0–200)
HDL: 54.4 mg/dL (ref >39.00)
LDL Cholesterol: 75 mg/dL (ref 0–99)
Total CHOL/HDL Ratio: 3
Triglycerides: 99 mg/dL (ref 0.0–149.0)
VLDL: 19.8 mg/dL (ref 0.0–40.0)

## 2013-08-14 LAB — ALT: ALT: 24 U/L (ref 0–53)

## 2013-08-22 ENCOUNTER — Telehealth: Payer: Self-pay | Admitting: Cardiology

## 2013-08-22 DIAGNOSIS — E782 Mixed hyperlipidemia: Secondary | ICD-10-CM

## 2013-08-22 NOTE — Telephone Encounter (Signed)
1 yr f/u labs ordered.  

## 2013-08-22 NOTE — Telephone Encounter (Signed)
Message copied by Alcario Drought on Tue Aug 22, 2013  9:39 AM ------      Message from: [name]      Created: Mon Aug 21, 2013  6:11 PM       Lipids well controlled. Continue current medicines. ------

## 2013-09-06 ENCOUNTER — Other Ambulatory Visit: Payer: Self-pay | Admitting: *Deleted

## 2013-09-06 ENCOUNTER — Telehealth: Payer: Self-pay | Admitting: Interventional Cardiology

## 2013-09-06 MED ORDER — DIGOXIN 250 MCG PO TABS: 0.2500 mg | ORAL_TABLET | Freq: Every day | ORAL | Status: DC

## 2013-09-06 MED ORDER — PRAVASTATIN SODIUM 20 MG PO TABS: 20.0000 mg | ORAL_TABLET | Freq: Every day | ORAL | Status: DC

## 2013-09-06 MED ORDER — AMLODIPINE BESYLATE 5 MG PO TABS: 5.0000 mg | ORAL_TABLET | Freq: Every day | ORAL | Status: DC

## 2013-09-06 MED ORDER — TRIAMTERENE-HCTZ 75-50 MG PO TABS: 0.5000 | ORAL_TABLET | ORAL | Status: DC

## 2013-09-06 MED ORDER — ATENOLOL 25 MG PO TABS
25.0000 mg | ORAL_TABLET | Freq: Every day | ORAL | Status: DC
Start: 2013-09-06 — End: 2014-06-05

## 2013-09-06 MED ORDER — LISINOPRIL 20 MG PO TABS: 20.0000 mg | ORAL_TABLET | Freq: Every day | ORAL | Status: DC

## 2013-11-16 NOTE — Telephone Encounter (Signed)
Error

## 2014-03-29 ENCOUNTER — Other Ambulatory Visit: Payer: Self-pay | Admitting: Interventional Cardiology

## 2014-05-07 ENCOUNTER — Ambulatory Visit: Payer: Medicare HMO | Admitting: Interventional Cardiology

## 2014-05-11 ENCOUNTER — Encounter: Payer: Self-pay | Admitting: Interventional Cardiology

## 2014-06-05 ENCOUNTER — Other Ambulatory Visit: Payer: Self-pay

## 2014-06-05 MED ORDER — DIGOXIN 250 MCG PO TABS: 0.2500 mg | ORAL_TABLET | Freq: Every day | ORAL | Status: DC

## 2014-06-05 MED ORDER — CLOPIDOGREL BISULFATE 75 MG PO TABS: 75.0000 mg | ORAL_TABLET | Freq: Every day | ORAL | Status: DC

## 2014-06-05 MED ORDER — ATENOLOL 25 MG PO TABS: 25.0000 mg | ORAL_TABLET | Freq: Every day | ORAL | Status: DC

## 2014-06-05 MED ORDER — LISINOPRIL 20 MG PO TABS: 20.0000 mg | ORAL_TABLET | Freq: Every day | ORAL | Status: DC

## 2014-06-13 ENCOUNTER — Other Ambulatory Visit: Payer: Self-pay | Admitting: Interventional Cardiology

## 2014-07-05 ENCOUNTER — Encounter: Payer: Self-pay | Admitting: Interventional Cardiology

## 2014-07-05 ENCOUNTER — Ambulatory Visit (INDEPENDENT_AMBULATORY_CARE_PROVIDER_SITE_OTHER): Payer: Commercial Managed Care - HMO | Admitting: Interventional Cardiology

## 2014-07-05 VITALS — BP 132/74 | HR 55 | Ht 71.0 in | Wt 147.2 lb

## 2014-07-05 DIAGNOSIS — I2510 Atherosclerotic heart disease of native coronary artery without angina pectoris: Secondary | ICD-10-CM

## 2014-07-05 DIAGNOSIS — I252 Old myocardial infarction: Secondary | ICD-10-CM

## 2014-07-05 DIAGNOSIS — I10 Essential (primary) hypertension: Secondary | ICD-10-CM

## 2014-07-05 DIAGNOSIS — E782 Mixed hyperlipidemia: Secondary | ICD-10-CM

## 2014-07-05 DIAGNOSIS — E785 Hyperlipidemia, unspecified: Secondary | ICD-10-CM

## 2014-07-05 DIAGNOSIS — I471 Supraventricular tachycardia, unspecified: Secondary | ICD-10-CM

## 2014-07-05 MED ORDER — NITROGLYCERIN 0.4 MG SL SUBL: 0.4000 mg | SUBLINGUAL_TABLET | SUBLINGUAL | Status: DC | PRN

## 2014-07-05 NOTE — Patient Instructions (Addendum)
Your physician recommends that you continue on your current medications as directed. Please refer to the Current Medication list given to you today.   Your physician recommends that you return for lab work in: a month for  FASTING CMET, LIPID PROFILE, and [facility] wants you to follow-up in:  [place]. You will receive a reminder letter in the mail two months in advance. If you don't receive a letter, please call our office to schedule the follow-up appointment.

## 2014-07-05 NOTE — Progress Notes (Signed)
Patient ID: Jeffrey Zimmerman, male   DOB: 1936-06-20, 78 y.o.   MRN: [id number] Patient ID: Jeffrey Zimmerman, male   DOB: 1935/08/03, 78 y.o.   MRN: [id number]    [place], [place], [place]  [address] Phone: [phone] Fax:  [phone]  Date:  07/05/2014   ID:  Jeffrey Zimmerman, DOB 05-24-1936, MRN [id number]  PCP:  Simona Huh, MD      History of Present Illness: Jeffrey Zimmerman is a 78 y.o. male who has multiple stents from 2011, anterior STEMI. He has done well. Still reports some fatigue- but this is improved. BP at home is in the 150/80 range. CAD/ASCVD:  does yard work, no symptoms. walks 3x/week. No chest pain. c/o Palpitations fluttering in the abdomen, lasting a few seconds; nothing prolonged.  Denies : Chest pain.  Diaphoresis.  Dizziness.  Dyspnea on exertion.  Leg edema.  Nitroglycerin.  Paroxysmal nocturnal dyspnea.  Syncope.   Overall doing well since the last visit.  Wt Readings from Last 3 Encounters:  07/05/14 147 lb 3.2 oz (66.769 kg)  08/10/13 145 lb 12.8 oz (66.134 kg)     Past Medical History  Diagnosis Date  . Hypertension   . Myocardial infarct   . Cancer     colon  . Basal cell carcinoma   . Gout   . Irritable bowel syndrome   . SVT (supraventricular tachycardia)   . History of colonoscopy     1/5 AND 2/10  . History of cholecystectomy   . Coronary artery disease     04/21/10 Anter MI with BMS to LAD with residual proximal LAD EF 40%  . Adenomatous colon polyp 2/10    Current Outpatient Prescriptions  Medication Sig Dispense Refill  . amLODipine (NORVASC) 5 MG tablet TAKE 1 TABLET EVERY DAY 90 tablet 0  . aspirin EC 81 MG tablet Take 81 mg by mouth daily.    Marland Kitchen atenolol (TENORMIN) 25 MG tablet Take 1 tablet (25 mg total) by mouth daily. 90 tablet 0  . clopidogrel (PLAVIX) 75 MG tablet Take 1 tablet (75 mg total) by mouth daily. 90 tablet 0  . digoxin (LANOXIN) 0.25 MG tablet Take 1 tablet (0.25 mg total) by mouth daily. 90  tablet 2  . lisinopril (PRINIVIL,ZESTRIL) 20 MG tablet Take 1 tablet (20 mg total) by mouth daily. 90 tablet 0  . nitroGLYCERIN (NITROSTAT) 0.4 MG SL tablet Place 0.4 mg under the tongue every 5 (five) minutes as needed for chest pain.    . pravastatin (PRAVACHOL) 20 MG tablet Take 1 tablet (20 mg total) by mouth daily. 90 tablet 2  . triamterene-hydrochlorothiazide (MAXZIDE) 75-50 MG per tablet Take 0.5 tablets by mouth every other day. 45 tablet 2   No current facility-administered medications for this visit.    Allergies:    Allergies  Allergen Reactions  . Codeine Other (See Comments)    Doesn't help with with pain control.     Social History:  The patient  reports that he has never smoked. He does not have any smokeless tobacco history on file. He reports that he does not drink alcohol or use illicit drugs.   Family History:  The patient's family history includes CAD in his mother; Colon cancer in his father; Heart attack in his mother; Rheum arthritis in his maternal grandfather.   ROS:  Please see the history of present illness.  No nausea, vomiting.  No fevers, chills.  No focal weakness.  No dysuria.  All other systems reviewed and negative.   PHYSICAL EXAM: VS:  BP 132/74 mmHg  Pulse 55  Ht 5\' 11"  (1.803 m)  Wt 147 lb 3.2 oz (66.769 kg)  BMI 20.54 kg/m2 Well nourished, well developed, in no acute distress HEENT: normal Neck: no JVD, no carotid bruits Cardiac:  normal S1, S2; RRR;  Lungs:  clear to auscultation bilaterally, no wheezing, rhonchi or rales Abd: soft, nontender, no hepatomegaly Ext: bilateral, right ankle> left ankle-chronic; worse after fall  Skin: warm and dry Neuro:   no focal abnormalities noted; hard of hearing Psych: anxious affect  EKG:  NSR, prior septal MI    ASSESSMENT AND PLAN:  CAD in native artery  /old MI Notes:  No angina. Negative  stress test in 7/14.   S/p anterior MI.  EF 61%. Septal hypokinesis.       2. Benign hypertension    Continue Amlodipine Besylate Tablet, 5 MG, 1 tablet, Orally, Once a day, 30 day(s), 30, Refills 11 Notes: blood pressure better today.   3. Hyperlipidemia: LDL 68 in 2013.  LDL 75 in 1/15.  Recheck lipids in the next month.    4. Atrial tachycardia by monitor in 2013. Short runs.  Minimal sx at this time.  1-2 second skipped beats.  Check digoxin level at next labs.  Preventive Medicine  Adult topics discussed:  Diet: healthy diet.  Exercise: 5 days a week, at least 30 minutes of aerobic exercise.    Follow Up 9 months     Signed, Mina Marble, MD, [facility] 07/05/2014 4:48 PM

## 2014-07-06 ENCOUNTER — Other Ambulatory Visit: Payer: Self-pay

## 2014-07-06 MED ORDER — AMLODIPINE BESYLATE 5 MG PO TABS: 5.0000 mg | ORAL_TABLET | Freq: Every day | ORAL | Status: DC

## 2014-07-06 MED ORDER — PRAVASTATIN SODIUM 20 MG PO TABS: 20.0000 mg | ORAL_TABLET | Freq: Every day | ORAL | Status: DC

## 2014-07-06 MED ORDER — TRIAMTERENE-HCTZ 75-50 MG PO TABS: 0.5000 | ORAL_TABLET | ORAL | Status: DC

## 2014-08-07 ENCOUNTER — Encounter: Payer: Self-pay | Admitting: *Deleted

## 2014-08-07 ENCOUNTER — Other Ambulatory Visit: Payer: Self-pay | Admitting: *Deleted

## 2014-08-07 ENCOUNTER — Other Ambulatory Visit (INDEPENDENT_AMBULATORY_CARE_PROVIDER_SITE_OTHER): Payer: Commercial Managed Care - HMO | Admitting: *Deleted

## 2014-08-07 DIAGNOSIS — I2510 Atherosclerotic heart disease of native coronary artery without angina pectoris: Secondary | ICD-10-CM

## 2014-08-07 DIAGNOSIS — E785 Hyperlipidemia, unspecified: Secondary | ICD-10-CM | POA: Diagnosis not present

## 2014-08-07 LAB — COMPREHENSIVE METABOLIC PANEL
ALT: 20 U/L (ref 0–53)
AST: 23 U/L (ref 0–37)
Albumin: 4.7 g/dL (ref 3.5–5.2)
Alkaline Phosphatase: 63 U/L (ref 39–117)
BUN: 15 mg/dL (ref 6–23)
CO2: 33 mEq/L — ABNORMAL HIGH (ref 19–32)
Calcium: 9.6 mg/dL (ref 8.4–10.5)
Chloride: 96 mEq/L (ref 96–112)
Creatinine, Ser: 0.79 mg/dL (ref 0.40–1.50)
GFR: 101.19 mL/min (ref >60.00)
Glucose, Bld: 101 mg/dL — ABNORMAL HIGH (ref 70–99)
Potassium: 3.3 mEq/L — ABNORMAL LOW (ref 3.5–5.1)
Sodium: 137 mEq/L (ref 135–145)
Total Bilirubin: 1 mg/dL (ref 0.2–1.2)
Total Protein: 7.4 g/dL (ref 6.0–8.3)

## 2014-08-07 LAB — LIPID PANEL
Cholesterol: 164 mg/dL (ref 0–200)
HDL: 55.2 mg/dL (ref >39.00)
LDL Cholesterol: 96 mg/dL (ref 0–99)
NonHDL: 108.8
Total CHOL/HDL Ratio: 3
Triglycerides: 66 mg/dL (ref 0.0–149.0)
VLDL: 13.2 mg/dL (ref 0.0–40.0)

## 2014-08-07 MED ORDER — AMLODIPINE BESYLATE 5 MG PO TABS: 5.0000 mg | ORAL_TABLET | Freq: Every day | ORAL | Status: DC

## 2014-08-08 ENCOUNTER — Other Ambulatory Visit: Payer: Medicare HMO

## 2014-08-08 LAB — DIGOXIN LEVEL: Digoxin Level: 0.9 ng/mL (ref 0.8–2.0)

## 2014-08-14 ENCOUNTER — Other Ambulatory Visit: Payer: Medicare HMO

## 2014-08-20 ENCOUNTER — Other Ambulatory Visit: Payer: Self-pay | Admitting: *Deleted

## 2014-08-20 ENCOUNTER — Telehealth: Payer: Self-pay | Admitting: Interventional Cardiology

## 2014-08-20 DIAGNOSIS — E876 Hypokalemia: Secondary | ICD-10-CM

## 2014-08-20 MED ORDER — POTASSIUM CHLORIDE CRYS ER 20 MEQ PO TBCR
EXTENDED_RELEASE_TABLET | ORAL | Status: DC
Start: 2014-08-20 — End: 2014-11-02

## 2014-08-20 NOTE — Telephone Encounter (Signed)
The patient came in to the office today to discuss his lab results. We had not been able to reach him by phone due to no valid contact #. Valid # obtained today. Labs reviewed with the patient. He also stated that he has been unable to get his amlodipine since December. Per discussion with [facility], they could not fill his local RX on 08/07/14 as there was a mail order RX that was active. Spoke with [facility] and they stated they sent the patient his amlodipine on 06/20/15 and they resent another RX on 08/17/14 to the patient. Attempted to call the patient to advise him. No answer. I will c/b on Wednesday when I return to the office to advise.

## 2014-08-22 ENCOUNTER — Encounter: Payer: Self-pay | Admitting: *Deleted

## 2014-08-22 NOTE — Telephone Encounter (Signed)
Letter mailed to the patient

## 2014-08-27 ENCOUNTER — Other Ambulatory Visit (INDEPENDENT_AMBULATORY_CARE_PROVIDER_SITE_OTHER): Payer: Commercial Managed Care - HMO | Admitting: *Deleted

## 2014-08-27 DIAGNOSIS — E876 Hypokalemia: Secondary | ICD-10-CM

## 2014-08-27 LAB — BASIC METABOLIC PANEL
BUN: 13 mg/dL (ref 6–23)
CO2: 33 mEq/L — ABNORMAL HIGH (ref 19–32)
Calcium: 9.7 mg/dL (ref 8.4–10.5)
Chloride: 95 mEq/L — ABNORMAL LOW (ref 96–112)
Creatinine, Ser: 0.88 mg/dL (ref 0.40–1.50)
GFR: 89.33 mL/min (ref >60.00)
Glucose, Bld: 67 mg/dL — ABNORMAL LOW (ref 70–99)
Potassium: 3.8 mEq/L (ref 3.5–5.1)
Sodium: 136 mEq/L (ref 135–145)

## 2014-08-27 NOTE — Addendum Note (Signed)
Addended by: Eulis Foster on: 08/27/2014 12:32 PM   Modules accepted: Orders

## 2014-08-30 ENCOUNTER — Encounter: Payer: Self-pay | Admitting: *Deleted

## 2014-10-22 ENCOUNTER — Other Ambulatory Visit: Payer: Self-pay | Admitting: *Deleted

## 2014-10-22 MED ORDER — AMLODIPINE BESYLATE 5 MG PO TABS: 5.0000 mg | ORAL_TABLET | Freq: Every day | ORAL | Status: DC

## 2014-10-22 MED ORDER — CLOPIDOGREL BISULFATE 75 MG PO TABS: 75.0000 mg | ORAL_TABLET | Freq: Every day | ORAL | Status: DC

## 2014-11-02 ENCOUNTER — Ambulatory Visit
Admission: RE | Admit: 2014-11-02 | Discharge: 2014-11-02 | Disposition: A | Discharge status: Home / Self Care | Payer: Commercial Managed Care - HMO | Source: Ambulatory Visit | Attending: Family Medicine | Admitting: Family Medicine

## 2014-11-02 ENCOUNTER — Other Ambulatory Visit: Payer: Self-pay | Admitting: Family Medicine

## 2014-11-02 ENCOUNTER — Other Ambulatory Visit: Payer: Self-pay

## 2014-11-02 ENCOUNTER — Other Ambulatory Visit: Payer: Self-pay | Admitting: Interventional Cardiology

## 2014-11-02 DIAGNOSIS — M79641 Pain in right hand: Secondary | ICD-10-CM | POA: Diagnosis not present

## 2014-11-02 DIAGNOSIS — M7989 Other specified soft tissue disorders: Secondary | ICD-10-CM

## 2014-11-02 DIAGNOSIS — E876 Hypokalemia: Secondary | ICD-10-CM

## 2014-11-02 DIAGNOSIS — S6991XA Unspecified injury of right wrist, hand and finger(s), initial encounter: Secondary | ICD-10-CM | POA: Diagnosis not present

## 2014-11-02 MED ORDER — NITROGLYCERIN 0.4 MG SL SUBL: 0.4000 mg | SUBLINGUAL_TABLET | SUBLINGUAL | Status: DC | PRN

## 2014-11-02 MED ORDER — CLOPIDOGREL BISULFATE 75 MG PO TABS: 75.0000 mg | ORAL_TABLET | Freq: Every day | ORAL | Status: DC

## 2014-11-02 MED ORDER — POTASSIUM CHLORIDE CRYS ER 20 MEQ PO TBCR: EXTENDED_RELEASE_TABLET | ORAL | Status: DC

## 2014-11-02 MED ORDER — LISINOPRIL 20 MG PO TABS: 20.0000 mg | ORAL_TABLET | Freq: Every day | ORAL | Status: DC

## 2014-11-02 MED ORDER — AMLODIPINE BESYLATE 5 MG PO TABS: 5.0000 mg | ORAL_TABLET | Freq: Every day | ORAL | Status: DC

## 2014-11-02 MED ORDER — DIGOXIN 250 MCG PO TABS: 0.2500 mg | ORAL_TABLET | Freq: Every day | ORAL | Status: DC

## 2014-11-02 MED ORDER — ATENOLOL 25 MG PO TABS: 25.0000 mg | ORAL_TABLET | Freq: Every day | ORAL | Status: DC

## 2015-04-01 DIAGNOSIS — E46 Unspecified protein-calorie malnutrition: Secondary | ICD-10-CM | POA: Diagnosis not present

## 2015-04-01 DIAGNOSIS — Z23 Encounter for immunization: Secondary | ICD-10-CM | POA: Diagnosis not present

## 2015-04-01 DIAGNOSIS — Z0000 Encounter for general adult medical examination without abnormal findings: Secondary | ICD-10-CM | POA: Diagnosis not present

## 2015-04-01 DIAGNOSIS — I119 Hypertensive heart disease without heart failure: Secondary | ICD-10-CM | POA: Diagnosis not present

## 2015-04-01 DIAGNOSIS — I2510 Atherosclerotic heart disease of native coronary artery without angina pectoris: Secondary | ICD-10-CM | POA: Diagnosis not present

## 2015-04-18 ENCOUNTER — Other Ambulatory Visit: Payer: Self-pay | Admitting: Interventional Cardiology

## 2015-05-14 DIAGNOSIS — H35372 Puckering of macula, left eye: Secondary | ICD-10-CM | POA: Diagnosis not present

## 2015-05-14 DIAGNOSIS — H2513 Age-related nuclear cataract, bilateral: Secondary | ICD-10-CM | POA: Diagnosis not present

## 2015-05-14 DIAGNOSIS — H5703 Miosis: Secondary | ICD-10-CM | POA: Diagnosis not present

## 2015-05-27 DIAGNOSIS — H2512 Age-related nuclear cataract, left eye: Secondary | ICD-10-CM | POA: Diagnosis not present

## 2015-09-04 ENCOUNTER — Other Ambulatory Visit: Payer: Self-pay | Admitting: Interventional Cardiology

## 2015-09-04 NOTE — Telephone Encounter (Signed)
Please call the office to make an appointment for further refills.

## 2015-11-08 ENCOUNTER — Other Ambulatory Visit: Payer: Self-pay | Admitting: Interventional Cardiology

## 2015-11-11 ENCOUNTER — Other Ambulatory Visit: Payer: Self-pay | Admitting: Interventional Cardiology

## 2015-11-13 DIAGNOSIS — I208 Other forms of angina pectoris: Secondary | ICD-10-CM | POA: Diagnosis not present

## 2015-11-13 DIAGNOSIS — I252 Old myocardial infarction: Secondary | ICD-10-CM | POA: Diagnosis not present

## 2015-11-13 DIAGNOSIS — I471 Supraventricular tachycardia: Secondary | ICD-10-CM | POA: Diagnosis not present

## 2015-11-13 DIAGNOSIS — R609 Edema, unspecified: Secondary | ICD-10-CM | POA: Diagnosis not present

## 2015-11-13 DIAGNOSIS — I119 Hypertensive heart disease without heart failure: Secondary | ICD-10-CM | POA: Diagnosis not present

## 2015-11-13 DIAGNOSIS — M109 Gout, unspecified: Secondary | ICD-10-CM | POA: Diagnosis not present

## 2015-11-13 DIAGNOSIS — I2510 Atherosclerotic heart disease of native coronary artery without angina pectoris: Secondary | ICD-10-CM | POA: Diagnosis not present

## 2015-11-22 ENCOUNTER — Other Ambulatory Visit: Payer: Self-pay | Admitting: Interventional Cardiology

## 2016-01-01 ENCOUNTER — Other Ambulatory Visit: Payer: Self-pay | Admitting: Interventional Cardiology

## 2016-01-01 NOTE — Telephone Encounter (Signed)
triamterene-hydrochlorothiazide (MAXZIDE) 75-50 MG tablet  Medication   Date: 11/11/2015  Department: [place]  Ordering/Authorizing: [name], MD      Order Providers    Prescribing Provider Encounter Provider   [name], MD [name], MD    Medication Detail      Disp Refills Start End     triamterene-hydrochlorothiazide [facility]) 75-50 MG tablet 15 tablet 0 11/11/2015     Sig - Route: Take 0.5 tablets by mouth every other day. **patient is overdue for an appointment, please call and schedule for further refills** - Oral    E-Prescribing Status: Receipt confirmed by pharmacy (11/11/2015 9:34 AM EDT)     Pharmacy    [place], [address] [facility]     Has appt in July

## 2016-02-04 ENCOUNTER — Other Ambulatory Visit: Payer: Self-pay | Admitting: Interventional Cardiology

## 2016-02-05 NOTE — Telephone Encounter (Signed)
amLODipine (NORVASC) 5 MG tablet  Medication   Date: 11/22/2015  Department: [facility] [address] [facility]  Ordering/Authorizing: [name], MD      Order Providers    Prescribing Provider Encounter Provider   [name], MD [name], MD    Medication Detail      Disp Refills Start End     amLODipine [facility]) 5 MG tablet 90 tablet 1 11/22/2015     Sig: TAKE 1 TABLET EVERY DAY    E-Prescribing Status: Receipt confirmed by pharmacy (11/22/2015 12:28 PM EDT)     Pharmacy    [facility] [place], [address]

## 2016-02-11 ENCOUNTER — Other Ambulatory Visit: Payer: Self-pay

## 2016-02-11 ENCOUNTER — Ambulatory Visit (INDEPENDENT_AMBULATORY_CARE_PROVIDER_SITE_OTHER): Payer: Commercial Managed Care - HMO | Admitting: Interventional Cardiology

## 2016-02-11 NOTE — Progress Notes (Deleted)
Cardiology Office Note   Date:  02/11/2016   ID:  [name], DOB 1936-03-06, MRN [id number]  PCP:  Simona Huh, MD    No chief complaint on file.    Wt Readings from Last 3 Encounters:  07/05/14 147 lb 3.2 oz (66.8 kg)  08/10/13 145 lb 12.8 oz (66.1 kg)       History of Present Illness: Jeffrey Zimmerman is a 80 y.o. male  who has multiple stents from 2011, anterior STEMI. He has done well. Still reports some fatigue- but this is improved. BP at home is in the 150/80 range. CAD/ASCVD:  does yard work, no symptoms. walks 3x/week. No chest pain. c/o Palpitations fluttering in the abdomen, lasting a few seconds; nothing prolonged.  Denies : Chest pain.  Diaphoresis.  Dizziness.  Dyspnea on exertion.  Leg edema.  Nitroglycerin.  Paroxysmal nocturnal dyspnea.  Syncope.   Overall doing well since the last visit in 2015.    Past Medical History:  Diagnosis Date  . Adenomatous colon polyp 2/10  . Basal cell carcinoma   . Cancer    colon  . Coronary artery disease    04/21/10 Anter MI with BMS to LAD with residual proximal LAD EF 40%  . Gout   . History of cholecystectomy   . History of colonoscopy    1/5 AND 2/10  . Hypertension   . Irritable bowel syndrome   . Myocardial infarct   . SVT (supraventricular tachycardia)     Past Surgical History:  Procedure Laterality Date  . APPENDECTOMY    . CARDIAC CATHETERIZATION     04/21/10 Anter MI with BMS to LAD with residual proximal LAD EF 40%  . KNEE ARTHOSCOPY Bilateral   . PARTIAL LEFT NEPHRECTOMY     (for oncocytoma-benign)     Current Outpatient Prescriptions  Medication Sig Dispense Refill  . amLODipine (NORVASC) 5 MG tablet TAKE 1 TABLET EVERY DAY 90 tablet 1  . aspirin EC 81 MG tablet Take 81 mg by mouth daily.    Marland Kitchen atenolol (TENORMIN) 25 MG tablet Take 1 tablet (25 mg total) by mouth daily. **patient is overdue for an appointment, please call and schedule for further refills** 30 tablet 0  .  clopidogrel (PLAVIX) 75 MG tablet TAKE 1 TABLET EVERY DAY 90 tablet 0  . digoxin (LANOXIN) 0.25 MG tablet Take 1 tablet (250 mcg total) by mouth daily. **patient is overdue for an appointment, please call and schedule for further refills** 30 tablet 0  . lisinopril (PRINIVIL,ZESTRIL) 20 MG tablet Take 1 tablet (20 mg total) by mouth daily. **patient is overdue for an appointment, please call and schedule for further refills** 30 tablet 0  . nitroGLYCERIN (NITROSTAT) 0.4 MG SL tablet Place 1 tablet (0.4 mg total) under the tongue every 5 (five) minutes as needed for chest pain. 100 tablet 0  . potassium chloride SA (K-DUR,KLOR-CON) 20 MEQ tablet Take one tablet by mouth once daily 90 tablet 1  . pravastatin (PRAVACHOL) 20 MG tablet Take 1 tablet (20 mg total) by mouth daily. **patient is overdue for an appointment, please call and schedule for further refills** 30 tablet 0  . triamterene-hydrochlorothiazide (MAXZIDE) 75-50 MG tablet TAKE 1/2 TABLET BY MOUTH EVERY OTHER DAY 15 tablet 0   No current facility-administered medications for this visit.     Allergies:   Codeine    Social History:  The patient  reports that he has never smoked. He does not have any smokeless  tobacco history on file. He reports that he does not drink alcohol or use drugs.   Family History:  The patient's ***family history includes CAD in his mother; Colon cancer in his father; Heart attack in his mother; Rheum arthritis in his maternal grandfather.    ROS:  Please see the history of present illness.   Otherwise, review of systems are positive for ***.   All other systems are reviewed and negative.    PHYSICAL EXAM: VS:  There were no vitals taken for this visit. , BMI There is no height or weight on file to calculate BMI. GEN: Well nourished, well developed, in no acute distress  HEENT: normal  Neck: no JVD, carotid bruits, or masses Cardiac: ***RRR; no murmurs, rubs, or gallops,no edema  Respiratory:  clear to  auscultation bilaterally, normal work of breathing GI: soft, nontender, nondistended, + BS MS: no deformity or atrophy  Skin: warm and dry, no rash Neuro:  Strength and sensation are intact Psych: euthymic mood, full affect   EKG:   The ekg ordered today demonstrates ***   Recent Labs: No results found for requested labs within last 8760 hours.   Lipid Panel    Component Value Date/Time   CHOL 164 08/07/2014 0924   TRIG 66.0 08/07/2014 0924   HDL 55.20 08/07/2014 0924   CHOLHDL 3 08/07/2014 0924   VLDL 13.2 08/07/2014 0924   LDLCALC 96 08/07/2014 0924     Other studies Reviewed: Additional studies/ records that were reviewed today with results demonstrating: ***.   ASSESSMENT AND PLAN:  1. CAD: Negative stress test in 7/14. 2. HTN 3. Hyperlipidemia: 4. Atrial tachycardia by monitor in 2013   Current medicines are reviewed at length with the patient today.  The patient concerns regarding his medicines were addressed.  The following changes have been made:  No change***  Labs/ tests ordered today include: *** No orders of the defined types were placed in this encounter.   Recommend 150 minutes/week of aerobic exercise Low fat, low carb, high fiber diet recommended  Disposition:   FU in ***   Signed, Larae Grooms, MD  02/11/2016 12:10 AM    [facility] [place], [place], [place]  [address] Phone: [phone]; Fax: [phone]

## 2016-02-11 NOTE — Telephone Encounter (Signed)
The pt did not come to todays OV with Dr Irish Lack. Medications not filled.

## 2016-02-12 ENCOUNTER — Other Ambulatory Visit: Payer: Self-pay | Admitting: Interventional Cardiology

## 2016-02-19 ENCOUNTER — Encounter: Payer: Self-pay | Admitting: Interventional Cardiology

## 2016-03-03 ENCOUNTER — Telehealth: Payer: Self-pay | Admitting: Interventional Cardiology

## 2016-03-03 NOTE — Telephone Encounter (Signed)
Pt was last seen by our office 07/05/2014.  The pt has a pending appointment with Dr Irish Lack on 04/10/2016. At this time we can authorize a local Rx to last until his September appointment if needed.  If the pt currently has a supply of medication then we can send in 90 day supply after pt's appointment. Left message on machine for pt to contact the office.

## 2016-03-03 NOTE — Telephone Encounter (Signed)
New message     Pt would like to know if he can change his prescriptions to 90 days instead of 30 days? Please call.     *STAT* If patient is at the pharmacy, call can be transferred to refill team.   1. Which medications need to be refilled? (please list name of each medication and dose if known) Plavix 75mg , clopidocrel 75mg , amlodipine 5mg , triamterene 75 mg, atenolol 25mg , pravastatin 20mg , lisimopril 20mg , digoxin 0.25 mg, furosemide 20 mg, aspirin 81mg ,   2. Which pharmacy/location (including street and city if local pharmacy) is medication to be sent to? [facility] mail order   3. Do they need a 30 day or 90 day supply? [place]

## 2016-03-04 MED ORDER — PRAVASTATIN SODIUM 20 MG PO TABS: ORAL_TABLET | ORAL | 0 refills | Status: DC

## 2016-03-04 MED ORDER — TRIAMTERENE-HCTZ 75-50 MG PO TABS: ORAL_TABLET | ORAL | 0 refills | Status: DC

## 2016-03-04 MED ORDER — CLOPIDOGREL BISULFATE 75 MG PO TABS: 75.0000 mg | ORAL_TABLET | Freq: Every day | ORAL | 0 refills | Status: DC

## 2016-03-04 MED ORDER — ATENOLOL 25 MG PO TABS: ORAL_TABLET | ORAL | 0 refills | Status: DC

## 2016-03-04 MED ORDER — LISINOPRIL 20 MG PO TABS: 20.0000 mg | ORAL_TABLET | Freq: Every day | ORAL | 0 refills | Status: DC

## 2016-03-04 MED ORDER — DIGOXIN 250 MCG PO TABS: ORAL_TABLET | ORAL | 0 refills | Status: DC

## 2016-03-04 MED ORDER — AMLODIPINE BESYLATE 5 MG PO TABS: 5.0000 mg | ORAL_TABLET | Freq: Every day | ORAL | 0 refills | Status: DC

## 2016-03-04 NOTE — Telephone Encounter (Signed)
Refill sent to the pharmacy electronically.  

## 2016-03-11 ENCOUNTER — Other Ambulatory Visit: Payer: Self-pay | Admitting: Interventional Cardiology

## 2016-03-27 ENCOUNTER — Encounter: Payer: Self-pay | Admitting: Interventional Cardiology

## 2016-04-09 NOTE — Progress Notes (Signed)
Cardiology Office Note   Date:  04/10/2016   ID:  Jeffrey Zimmerman, DOB Apr 03, 1936, MRN [id number]  PCP:  Jeffrey Huh, MD    No chief complaint on file. f/u CAD   Wt Readings from Last 3 Encounters:  04/10/16 153 lb 12.8 oz (69.8 kg)  07/05/14 147 lb 3.2 oz (66.8 kg)  08/10/13 145 lb 12.8 oz (66.1 kg)       History of Present Illness: Jeffrey Zimmerman is a 80 y.o. male  has multiple stents from 2011, anterior STEMI. He has done well. Still reports some fatigue- but this is improved. BP at home is in the 150/80 range.  He has been out of several meds. CAD/ASCVD:  does very little yard work, no symptoms. Walks 3x/week- goes to [employer]. No chest pain. c/o Palpitations fluttering in the abdomen, lasting a few seconds; nothing prolonged.  Denies : Chest pain.  Diaphoresis.  Dizziness.  Dyspnea on exertion.  Nitroglycerin.  Paroxysmal nocturnal dyspnea.  Syncope.     Overall doing well since the last visit.  He reports ankle edema at different times.    Past Medical History:  Diagnosis Date  . Adenomatous colon polyp 2/10  . Basal cell carcinoma   . Cancer ([place])    colon  . Coronary artery disease    04/21/10 Anter MI with BMS to LAD with residual proximal LAD EF 40%  . Gout   . History of cholecystectomy   . History of colonoscopy    1/5 AND 2/10  . Hypertension   . Irritable bowel syndrome   . Myocardial infarct ([place])   . SVT (supraventricular tachycardia) (HCC)     Past Surgical History:  Procedure Laterality Date  . APPENDECTOMY    . CARDIAC CATHETERIZATION     04/21/10 Anter MI with BMS to LAD with residual proximal LAD EF 40%  . KNEE ARTHOSCOPY Bilateral   . PARTIAL LEFT NEPHRECTOMY     (for oncocytoma-benign)     Current Outpatient Prescriptions  Medication Sig Dispense Refill  . amLODipine (NORVASC) 5 MG tablet Take 1 tablet (5 mg total) by mouth daily. 90 tablet 0  . aspirin EC 81 MG tablet Take 81 mg by mouth daily.    Marland Kitchen atenolol  (TENORMIN) 25 MG tablet TAKE 1 TABLET EVERY DAY (OVERDUE FOR AN APPOINTMENT, PLEASE CALL AND SCHEDULE FOR FURTHER REFILLS) 90 tablet 0  . clopidogrel (PLAVIX) 75 MG tablet Take 1 tablet (75 mg total) by mouth daily. 90 tablet 0  . digoxin (LANOXIN) 0.25 MG tablet TAKE 1 TABLET EVERY DAY (OVERDUE FOR AN APPOINTMENT, PLEASE CALL AND SCHEDULE FOR FURTHER REFILLS) 90 tablet 0  . lisinopril (PRINIVIL,ZESTRIL) 20 MG tablet Take 1 tablet (20 mg total) by mouth daily. **patient is overdue for an appointment, please call and schedule for further refills** 30 tablet 0  . nitroGLYCERIN (NITROSTAT) 0.4 MG SL tablet Place 1 tablet (0.4 mg total) under the tongue every 5 (five) minutes as needed for chest pain. 100 tablet 0  . potassium chloride SA (K-DUR,KLOR-CON) 20 MEQ tablet Take one tablet by mouth once daily 90 tablet 1  . pravastatin (PRAVACHOL) 20 MG tablet TAKE 1 TABLET DAILY (OVERDUE FOR AN APPOINTMENT, PLEASE CALL AND SCHEDULE FOR FURTHER REFILLS) 90 tablet 0  . triamterene-hydrochlorothiazide (MAXZIDE) 75-50 MG tablet TAKE 1/2 TABLET BY MOUTH EVERY OTHER DAY 90 tablet 0   No current facility-administered medications for this visit.     Allergies:   Codeine  Social History:  The patient  reports that he has never smoked. He has never used smokeless tobacco. He reports that he does not drink alcohol or use drugs.   Family History:  The patient's family history includes CAD in his mother; Colon cancer in his father; Heart attack in his mother; Rheum arthritis in his maternal grandfather.    ROS:  Please see the history of present illness.   Otherwise, review of systems are positive for edema.   All other systems are reviewed and negative.    PHYSICAL EXAM: VS:  BP 130/81   Pulse 72   Ht 5\' 11"  (1.803 m)   Wt 153 lb 12.8 oz (69.8 kg)   BMI 21.45 kg/m  , BMI Body mass index is 21.45 kg/m. GEN: Well nourished, well developed, in no acute distress  HEENT: normal  Neck: no JVD, carotid  bruits, or masses Cardiac: RRR; no murmurs, rubs, or gallops,trivial ankle edema  Respiratory:  clear to auscultation bilaterally, normal work of breathing GI: soft, nontender, nondistended, + BS MS: no deformity or atrophy  Skin: warm and dry, no rash Neuro:  Strength and sensation are intact Psych: euthymic mood, full affect   EKG:   The ekg ordered today demonstrates NSR, NSST   Recent Labs: No results found for requested labs within last 8760 hours.   Lipid Panel    Component Value Date/Time   CHOL 164 08/07/2014 0924   TRIG 66.0 08/07/2014 0924   HDL 55.20 08/07/2014 0924   CHOLHDL 3 08/07/2014 0924   VLDL 13.2 08/07/2014 0924   LDLCALC 96 08/07/2014 0924     Other studies Reviewed: Additional studies/ records that were reviewed today with results demonstrating: cath results as above.   ASSESSMENT AND PLAN:  1. CAD: stop aspirin.  No angina.  Continue aggressive secondary prevention. 2. HTN: Blood pressure well controlled. Continue current medications. 3. Hyperlipidemia: Continue statin. check labs today. 4. Atrial tachycardia: by monitor in 2013. Short runs.  Minimal sx at this time.  1-2 second skipped beats.  Check digoxin level with today's labs. 5. Edema: I encouraged him to elevate his legs. He'll continue Maxide. I also gave him information regarding low pressure compression stockings, 10-20 mmHg up to below the knee.  I think this is all venous insufficiency.   Current medicines are reviewed at length with the patient today.  The patient concerns regarding his medicines were addressed.  The following changes have been made:  No change  Labs/ tests ordered today include:  No orders of the defined types were placed in this encounter.   Recommend 150 minutes/week of aerobic exercise Low fat, low carb, high fiber diet recommended  Disposition:   FU in 1 year   Signed, Jeffrey Grooms, MD  04/10/2016 9:12 AM    [facility] [place], [place], [place]  [address] Phone: [phone]; Fax: [phone]

## 2016-04-10 ENCOUNTER — Encounter: Payer: Self-pay | Admitting: Interventional Cardiology

## 2016-04-10 ENCOUNTER — Ambulatory Visit (INDEPENDENT_AMBULATORY_CARE_PROVIDER_SITE_OTHER): Payer: Commercial Managed Care - HMO | Admitting: Interventional Cardiology

## 2016-04-10 VITALS — BP 130/81 | HR 72 | Ht 71.0 in | Wt 153.8 lb

## 2016-04-10 DIAGNOSIS — R600 Localized edema: Secondary | ICD-10-CM

## 2016-04-10 DIAGNOSIS — Z79899 Other long term (current) drug therapy: Secondary | ICD-10-CM | POA: Diagnosis not present

## 2016-04-10 DIAGNOSIS — I2510 Atherosclerotic heart disease of native coronary artery without angina pectoris: Secondary | ICD-10-CM | POA: Diagnosis not present

## 2016-04-10 DIAGNOSIS — E782 Mixed hyperlipidemia: Secondary | ICD-10-CM | POA: Diagnosis not present

## 2016-04-10 DIAGNOSIS — I10 Essential (primary) hypertension: Secondary | ICD-10-CM

## 2016-04-10 DIAGNOSIS — I252 Old myocardial infarction: Secondary | ICD-10-CM

## 2016-04-10 LAB — LIPID PANEL
Cholesterol: 161 mg/dL (ref 125–200)
HDL: 52 mg/dL (ref >=40)
LDL Cholesterol: 88 mg/dL (ref <130)
Total CHOL/HDL Ratio: 3.1 Ratio (ref <=5.0)
Triglycerides: 107 mg/dL (ref <150)
VLDL: 21 mg/dL (ref <30)

## 2016-04-10 LAB — COMPREHENSIVE METABOLIC PANEL
ALT: 11 U/L (ref 9–46)
AST: 19 U/L (ref 10–35)
Albumin: 4.6 g/dL (ref 3.6–5.1)
Alkaline Phosphatase: 58 U/L (ref 40–115)
BUN: 8 mg/dL (ref 7–25)
CO2: 31 mmol/L (ref 20–31)
Calcium: 9.6 mg/dL (ref 8.6–10.3)
Chloride: 96 mmol/L — ABNORMAL LOW (ref 98–110)
Creat: 0.92 mg/dL (ref 0.70–1.18)
Glucose, Bld: 93 mg/dL (ref 65–99)
Potassium: 3.7 mmol/L (ref 3.5–5.3)
Sodium: 139 mmol/L (ref 135–146)
Total Bilirubin: 1.3 mg/dL — ABNORMAL HIGH (ref 0.2–1.2)
Total Protein: 6.9 g/dL (ref 6.1–8.1)

## 2016-04-10 LAB — DIGOXIN LEVEL: Digoxin Level: 1.1 ug/L (ref 0.8–2.0)

## 2016-04-10 NOTE — Patient Instructions (Signed)
Medication Instructions:  Stop taking Aspirin but continue to take Plavix. Allof your other medications remain the same.  Labwork: Dig level, CMET and Lipids today  Testing/Procedures: None  Follow-Up: Your physician wants you to follow-up in: 1 year. You will receive a reminder letter in the mail two months in advance. If you don't receive a letter, please call our office to schedule the follow-up appointment.     If you need a refill on your cardiac medications before your next appointment, please call your pharmacy.

## 2016-05-06 ENCOUNTER — Telehealth: Payer: Self-pay | Admitting: *Deleted

## 2016-05-06 ENCOUNTER — Ambulatory Visit: Payer: Commercial Managed Care - HMO | Admitting: Nurse Practitioner

## 2016-05-06 ENCOUNTER — Other Ambulatory Visit: Payer: Self-pay | Admitting: *Deleted

## 2016-05-06 MED ORDER — DIGOXIN 250 MCG PO TABS: 0.2500 mg | ORAL_TABLET | Freq: Every day | ORAL | 3 refills | Status: DC

## 2016-05-06 MED ORDER — AMLODIPINE BESYLATE 5 MG PO TABS: 5.0000 mg | ORAL_TABLET | Freq: Every day | ORAL | 0 refills | Status: DC

## 2016-05-06 MED ORDER — NITROGLYCERIN 0.4 MG SL SUBL: 0.4000 mg | SUBLINGUAL_TABLET | SUBLINGUAL | 1 refills | Status: DC | PRN

## 2016-05-06 MED ORDER — LISINOPRIL 20 MG PO TABS: 20.0000 mg | ORAL_TABLET | Freq: Every day | ORAL | 3 refills | Status: DC

## 2016-05-06 MED ORDER — CLOPIDOGREL BISULFATE 75 MG PO TABS: 75.0000 mg | ORAL_TABLET | Freq: Every day | ORAL | 3 refills | Status: DC

## 2016-05-06 MED ORDER — PRAVASTATIN SODIUM 20 MG PO TABS: 20.0000 mg | ORAL_TABLET | Freq: Every day | ORAL | 3 refills | Status: DC

## 2016-05-06 MED ORDER — TRIAMTERENE-HCTZ 75-50 MG PO TABS: ORAL_TABLET | ORAL | 1 refills | Status: DC

## 2016-05-06 NOTE — Telephone Encounter (Signed)
Pt came in to office today for ov appointment.  Truitt Merle, NP, researched pt's chart.  Pt seen Dr.Varanasi in September and on ov note stated pt can come back for f/u in one year.  Pt was coming in today to get medication refills.  T/w pt stated did not need to be seen today unless pt is having problems.  Pt stated just needs refills.  Told pt will fill all medications today for 90 day supply with 3 refills and send to [facility].  Pt was agreeable to treatment plan. Will send to [place] to [place].

## 2016-05-06 NOTE — Telephone Encounter (Signed)
Left message on machine for pt to contact the office.   

## 2016-05-06 NOTE — Telephone Encounter (Signed)
Agree. The patient was not having any issues. He was seen less than one month ago.   His medicines have been refilled.

## 2016-05-06 NOTE — Progress Notes (Deleted)
CARDIOLOGY OFFICE NOTE  Date:  05/06/2016    Jeffrey Zimmerman Date of Birth: 1936/04/29 Medical Record [id number]  PCP:  Simona Huh, MD  Cardiologist:  Servando Snare & ***    No chief complaint on file.   History of Present Illness: Jeffrey Zimmerman is a 80 y.o. male who presents today for a ***   Comes in today. Here with   Past Medical History:  Diagnosis Date  . Adenomatous colon polyp 2/10  . Basal cell carcinoma   . Cancer ([place])    colon  . Coronary artery disease    04/21/10 Anter MI with BMS to LAD with residual proximal LAD EF 40%  . Gout   . History of cholecystectomy   . History of colonoscopy    1/5 AND 2/10  . Hypertension   . Irritable bowel syndrome   . Myocardial infarct   . SVT (supraventricular tachycardia) ([facility])     Past Surgical History:  Procedure Laterality Date  . APPENDECTOMY    . CARDIAC CATHETERIZATION     04/21/10 Anter MI with BMS to LAD with residual proximal LAD EF 40%  . KNEE ARTHOSCOPY Bilateral   . PARTIAL LEFT NEPHRECTOMY     (for oncocytoma-benign)     Medications: Current Outpatient Prescriptions  Medication Sig Dispense Refill  . amLODipine (NORVASC) 5 MG tablet Take 1 tablet (5 mg total) by mouth daily. 90 tablet 0  . clopidogrel (PLAVIX) 75 MG tablet Take 1 tablet (75 mg total) by mouth daily. 90 tablet 0  . digoxin (LANOXIN) 0.25 MG tablet Take 0.25 mg by mouth daily.    Marland Kitchen lisinopril (PRINIVIL,ZESTRIL) 20 MG tablet Take 1 tablet (20 mg total) by mouth daily. **patient is overdue for an appointment, please call and schedule for further refills** 30 tablet 0  . nitroGLYCERIN (NITROSTAT) 0.4 MG SL tablet Place 1 tablet (0.4 mg total) under the tongue every 5 (five) minutes as needed for chest pain. 100 tablet 0  . pravastatin (PRAVACHOL) 20 MG tablet TAKE 1 TABLET DAILY (OVERDUE FOR AN APPOINTMENT, PLEASE CALL AND SCHEDULE FOR FURTHER REFILLS) 90 tablet 0  . triamterene-hydrochlorothiazide (MAXZIDE) 75-50 MG tablet TAKE  1/2 TABLET BY MOUTH EVERY OTHER DAY 90 tablet 0   No current facility-administered medications for this visit.     Allergies: Allergies  Allergen Reactions  . Codeine Other (See Comments)    Doesn't help with with pain control.     Social History: The patient  reports that he has never smoked. He has never used smokeless tobacco. He reports that he does not drink alcohol or use drugs.   Family History: The patient's ***family history includes CAD in his mother; Colon cancer in his father; Heart attack in his mother; Rheum arthritis in his maternal grandfather.   Review of Systems: Please see the history of present illness.   Otherwise, the review of systems is positive for {NONE DEFAULTED:18576::"none"}.   All other systems are reviewed and negative.   Physical Exam: VS:  There were no vitals taken for this visit. Marland Kitchen  BMI There is no height or weight on file to calculate BMI.  Wt Readings from Last 3 Encounters:  04/10/16 153 lb 12.8 oz (69.8 kg)  07/05/14 147 lb 3.2 oz (66.8 kg)  08/10/13 145 lb 12.8 oz (66.1 kg)    General: Pleasant. Well developed, well nourished and in no acute distress.   HEENT: Normal.  Neck: Supple, no JVD, carotid bruits, or masses noted.  Cardiac: ***Regular rate and rhythm. No murmurs, rubs, or gallops. No edema.  Respiratory:  Lungs are clear to auscultation bilaterally with normal work of breathing.  GI: Soft and nontender.  MS: No deformity or atrophy. Gait and ROM intact.  Skin: Warm and dry. Color is normal.  Neuro:  Strength and sensation are intact and no gross focal deficits noted.  Psych: Alert, appropriate and with normal affect.   LABORATORY DATA:  EKG:  EKG {ACTION; IS/IS [id number] ordered today. This demonstrates ***.  Lab Results  Component Value Date   WBC 6.1 06/18/2010   HGB 12.0 (L) 06/18/2010   HCT 35.8 (L) 06/18/2010   PLT 133 (L) 06/18/2010   GLUCOSE 93 04/10/2016   CHOL 161 04/10/2016   TRIG 107 04/10/2016    HDL 52 04/10/2016   LDLCALC 88 04/10/2016   ALT 11 04/10/2016   AST 19 04/10/2016   NA 139 04/10/2016   K 3.7 04/10/2016   CL 96 (L) 04/10/2016   CREATININE 0.92 04/10/2016   BUN 8 04/10/2016   CO2 31 04/10/2016   INR 1.06 04/23/2010    BNP (last 3 results) No results for input(s): BNP in the last 8760 hours.  ProBNP (last 3 results) No results for input(s): PROBNP in the last 8760 hours.   Other Studies Reviewed Today:   Assessment/Plan:   Current medicines are reviewed with the patient today.  The patient does not have concerns regarding medicines other than what has been noted above.  The following changes have been made:  See above.  Labs/ tests ordered today include:   No orders of the defined types were placed in this encounter.    Disposition:   FU with *** in {gen number [id number] {Days to years:10300}.   Patient is agreeable to this plan and will call if any problems develop in the interim.   Signed: Burtis Junes, RN, ANP-C 05/06/2016 7:47 AM  [facility] [address] [place], [place]  [address] Phone: [phone] Fax: [phone]

## 2016-05-12 ENCOUNTER — Telehealth: Payer: Self-pay | Admitting: *Deleted

## 2016-05-12 NOTE — Telephone Encounter (Signed)
Sent fax to [facility] for nitro refill.

## 2016-05-13 ENCOUNTER — Other Ambulatory Visit: Payer: Self-pay | Admitting: Interventional Cardiology

## 2016-05-13 MED ORDER — NITROGLYCERIN 0.4 MG SL SUBL: 0.4000 mg | SUBLINGUAL_TABLET | SUBLINGUAL | 1 refills | Status: DC | PRN

## 2016-07-27 ENCOUNTER — Encounter (HOSPITAL_COMMUNITY): Payer: Self-pay | Admitting: *Deleted

## 2016-07-27 ENCOUNTER — Emergency Department (HOSPITAL_COMMUNITY)
Admission: EM | Admit: 2016-07-27 | Discharge: 2016-07-27 | Disposition: A | Discharge status: Home / Self Care | Payer: Medicare HMO | Attending: Emergency Medicine | Admitting: Emergency Medicine

## 2016-07-27 ENCOUNTER — Emergency Department (HOSPITAL_COMMUNITY): Payer: Medicare HMO

## 2016-07-27 DIAGNOSIS — I252 Old myocardial infarction: Secondary | ICD-10-CM | POA: Diagnosis not present

## 2016-07-27 DIAGNOSIS — J069 Acute upper respiratory infection, unspecified: Secondary | ICD-10-CM | POA: Diagnosis not present

## 2016-07-27 DIAGNOSIS — I10 Essential (primary) hypertension: Secondary | ICD-10-CM | POA: Diagnosis not present

## 2016-07-27 DIAGNOSIS — Z85038 Personal history of other malignant neoplasm of large intestine: Secondary | ICD-10-CM | POA: Diagnosis not present

## 2016-07-27 DIAGNOSIS — R079 Chest pain, unspecified: Secondary | ICD-10-CM | POA: Diagnosis not present

## 2016-07-27 DIAGNOSIS — I2510 Atherosclerotic heart disease of native coronary artery without angina pectoris: Secondary | ICD-10-CM | POA: Insufficient documentation

## 2016-07-27 DIAGNOSIS — R05 Cough: Secondary | ICD-10-CM | POA: Diagnosis not present

## 2016-07-27 DIAGNOSIS — R059 Cough, unspecified: Secondary | ICD-10-CM

## 2016-07-27 LAB — BASIC METABOLIC PANEL
Anion gap: 12 (ref 5–15)
BUN: 19 mg/dL (ref 6–20)
CO2: 27 mmol/L (ref 22–32)
Calcium: 8.6 mg/dL — ABNORMAL LOW (ref 8.9–10.3)
Chloride: 95 mmol/L — ABNORMAL LOW (ref 101–111)
Creatinine, Ser: 1.1 mg/dL (ref 0.61–1.24)
GFR calc Af Amer: >60 mL/min (ref >60)
GFR calc non Af Amer: >60 mL/min (ref >60)
Glucose, Bld: 110 mg/dL — ABNORMAL HIGH (ref 65–99)
Potassium: 3.5 mmol/L (ref 3.5–5.1)
Sodium: 134 mmol/L — ABNORMAL LOW (ref 135–145)

## 2016-07-27 LAB — CBC WITH DIFFERENTIAL/PLATELET
Basophils Absolute: 0 10*3/uL (ref 0.0–0.1)
Basophils Relative: 0 %
Eosinophils Absolute: 0 10*3/uL (ref 0.0–0.7)
Eosinophils Relative: 0 %
HCT: 38.2 % — ABNORMAL LOW (ref 39.0–52.0)
Hemoglobin: 13 g/dL (ref 13.0–17.0)
Lymphocytes Relative: 9 %
Lymphs Abs: 0.7 10*3/uL (ref 0.7–4.0)
MCH: 30.3 pg (ref 26.0–34.0)
MCHC: 34 g/dL (ref 30.0–36.0)
MCV: 89 fL (ref 78.0–100.0)
Monocytes Absolute: 0.8 10*3/uL (ref 0.1–1.0)
Monocytes Relative: 10 %
Neutro Abs: 6.4 10*3/uL (ref 1.7–7.7)
Neutrophils Relative %: 81 %
Platelets: 146 10*3/uL — ABNORMAL LOW (ref 150–400)
RBC: 4.29 MIL/uL (ref 4.22–5.81)
RDW: 14.4 % (ref 11.5–15.5)
WBC: 7.9 10*3/uL (ref 4.0–10.5)

## 2016-07-27 MED ORDER — HYDROCODONE-HOMATROPINE 5-1.5 MG/5ML PO SYRP: 5.0000 mL | ORAL_SOLUTION | Freq: Three times a day (TID) | ORAL | 0 refills | Status: DC | PRN

## 2016-07-27 MED ORDER — SODIUM CHLORIDE 0.9 % IV BOLUS (SEPSIS): 1000.0000 mL | Freq: Once | INTRAVENOUS | Status: DC

## 2016-07-27 MED ORDER — ACETAMINOPHEN 500 MG PO TABS
1000.0000 mg | ORAL_TABLET | Freq: Once | ORAL | Status: AC
  Administered 2016-07-27: 1000 mg via ORAL
  Filled 2016-07-27: qty 2

## 2016-07-27 MED ORDER — AZITHROMYCIN 250 MG PO TABS: 250.0000 mg | ORAL_TABLET | Freq: Every day | ORAL | 0 refills | Status: DC

## 2016-07-27 MED ORDER — SODIUM CHLORIDE 0.9 % IV BOLUS (SEPSIS)
500.0000 mL | Freq: Once | INTRAVENOUS | Status: AC
  Administered 2016-07-27: 500 mL via INTRAVENOUS

## 2016-07-27 MED ORDER — BENZONATATE 100 MG PO CAPS
200.0000 mg | ORAL_CAPSULE | Freq: Once | ORAL | Status: AC
  Administered 2016-07-27: 200 mg via ORAL
  Filled 2016-07-27: qty 2

## 2016-07-27 NOTE — Discharge Instructions (Signed)
Take a Z-Pak as prescribed until finished. We recommend Hycodan for cough. Do not drive or drink alcohol after taking this medication as it may make you drowsy and impair your judgment. Follow-up with your primary care doctor regarding your visit today.

## 2016-07-27 NOTE — ED Triage Notes (Signed)
Pt came in via EMS with cough and congestion and reports coughing up green sputum, denies sob.

## 2016-07-27 NOTE — ED Provider Notes (Signed)
[facility] DEPT Provider Note   CSN: [id number] Arrival date & time: 07/27/16  1503    History   Chief Complaint Chief Complaint  Patient presents with  . Cough  . Nasal Congestion    HPI Jeffrey Zimmerman is a 81 y.o. male.  81 y/o male with a hx of HTN, CAD and anterior STEMI (s/p multiple stents from 2011, on Plavix), Atrial tachycardia, HLD, peripheral edema, and colon CA presents to the emergency department for evaluation of cough. Patient states that cough has been present for 2 weeks. It has been worsening recently. Symptoms associated with nasal congestion and rhinorrhea. The patient reports his cough being productive of green sputum. He has no complaints of chest pain at rest, though he does have some diffuse chest discomfort with coughing only. He denies taking any medications for his symptoms because of his history of hypertension. He expresses that he is unsure of what he can take. He has no complaints of shortness of breath. No history of chronic oxygen requirement. He denies any known recent fevers. He does take lisinopril. No sick contacts. The patient did receive a flu shot this year.      Past Medical History:  Diagnosis Date  . Adenomatous colon polyp 2/10  . Basal cell carcinoma   . Cancer ([place])    colon  . Coronary artery disease    04/21/10 Anter MI with BMS to LAD with residual proximal LAD EF 40%  . Gout   . History of cholecystectomy   . History of colonoscopy    1/5 AND 2/10  . Hypertension   . Irritable bowel syndrome   . Myocardial infarct   . SVT (supraventricular tachycardia) [facility])     Patient Active Problem List   Diagnosis Date Noted  . Bilateral lower extremity edema 04/10/2016  . Old myocardial infarction 08/10/2013  . Mixed hyperlipidemia 08/10/2013  . Other malaise and fatigue 08/08/2013  . Hypertension   . Coronary artery disease   . Cancer ([place])   . Basal cell carcinoma   . Gout   . Irritable bowel syndrome   . SVT  (supraventricular tachycardia) (HCC)     Past Surgical History:  Procedure Laterality Date  . APPENDECTOMY    . CARDIAC CATHETERIZATION     04/21/10 Anter MI with BMS to LAD with residual proximal LAD EF 40%  . KNEE ARTHOSCOPY Bilateral   . PARTIAL LEFT NEPHRECTOMY     (for oncocytoma-benign)       Home Medications    Prior to Admission medications   Medication Sig Start Date End Date Taking? Authorizing Provider  amLODipine (NORVASC) 5 MG tablet Take 1 tablet (5 mg total) by mouth daily. 05/06/16  Yes Burtis Junes, NP  clopidogrel (PLAVIX) 75 MG tablet Take 1 tablet (75 mg total) by mouth daily. 05/06/16  Yes Burtis Junes, NP  digoxin (LANOXIN) 0.25 MG tablet Take 1 tablet (0.25 mg total) by mouth daily. 05/06/16  Yes Burtis Junes, NP  lisinopril (PRINIVIL,ZESTRIL) 20 MG tablet Take 1 tablet (20 mg total) by mouth daily. 05/06/16  Yes Burtis Junes, NP  nitroGLYCERIN (NITROSTAT) 0.4 MG SL tablet Place 1 tablet (0.4 mg total) under the tongue every 5 (five) minutes as needed for chest pain. 05/13/16  Yes [name], MD  pravastatin (PRAVACHOL) 20 MG tablet Take 1 tablet (20 mg total) by mouth daily. 05/06/16  Yes Burtis Junes, NP  triamterene-hydrochlorothiazide (MAXZIDE) 75-50 MG tablet TAKE 1/2 TABLET BY  MOUTH EVERY OTHER DAY 05/06/16  Yes Burtis Junes, NP  azithromycin (ZITHROMAX) 250 MG tablet Take 1 tablet (250 mg total) by mouth daily. Take first 2 tablets together, then 1 every day until finished. 07/27/16   Antonietta Breach, PA-C  HYDROcodone-homatropine (HYCODAN) 5-1.5 MG/5ML syrup Take 5 mLs by mouth every 8 (eight) hours as needed for cough. 07/27/16   Antonietta Breach, PA-C    Family History Family History  Problem Relation Age of Onset  . Heart attack Mother   . CAD Mother   . Colon cancer Father   . Rheum arthritis Maternal Grandfather     Social History Social History  Substance Use Topics  . Smoking status: Never Smoker  . Smokeless tobacco:  Never Used  . Alcohol use No     Allergies   Codeine   Review of Systems Review of Systems Ten systems reviewed and are negative for acute change, except as noted in the HPI.    Physical Exam Updated Vital Signs BP (!) 113/51   Pulse 91   Temp 98.6 F (37 C) (Oral)   Resp 20   SpO2 93%   Physical Exam  Constitutional: He is oriented to person, place, and time. He appears well-developed and well-nourished. No distress.  Nontoxic and in NAD  HENT:  Head: Normocephalic and atraumatic.  Eyes: Conjunctivae and EOM are normal. No scleral icterus.  Neck: Normal range of motion.  No JVD  Cardiovascular: Normal rate, regular rhythm and intact distal pulses.   Pulmonary/Chest: Effort normal. No respiratory distress. He has no wheezes.  Respirations even and unlabored. SpO2 96% on room air. Diffuse decreased breath sounds, mild. Lungs otherwise clear.  Abdominal: Soft. He exhibits no distension.  Musculoskeletal: Normal range of motion.  Neurological: He is alert and oriented to person, place, and time. He exhibits normal muscle tone. Coordination normal.  GCS 15. Patient moving all extremities.  Skin: Skin is warm and dry. No rash noted. He is not diaphoretic. No erythema. No pallor.  Psychiatric: He has a normal mood and affect. His behavior is normal.  Nursing note and vitals reviewed.    ED Treatments / Results  Labs (all labs ordered are listed, but only abnormal results are displayed) Labs Reviewed  CBC WITH DIFFERENTIAL/PLATELET - Abnormal; Notable for the following:       Result Value   HCT 38.2 (*)    Platelets 146 (*)    All other components within normal limits  BASIC METABOLIC PANEL - Abnormal; Notable for the following:    Sodium 134 (*)    Chloride 95 (*)    Glucose, Bld 110 (*)    Calcium 8.6 (*)    All other components within normal limits    EKG  EKG Interpretation None       Radiology Dg Chest 2 View  Result Date: 07/27/2016 CLINICAL DATA:   Cough and congestion. EXAM: CHEST  2 VIEW COMPARISON:  10/29/2011 FINDINGS: The heart size and mediastinal contours are within normal limits. Bibasilar scarring/atelectasis present. There is no evidence of pulmonary edema, consolidation, pneumothorax, nodule or pleural fluid. The visualized skeletal structures are unremarkable. IMPRESSION: No active cardiopulmonary disease. Electronically Signed   By: Aletta Edouard M.D.   On: 07/27/2016 16:33    Procedures Procedures (including critical care time)  Medications Ordered in ED Medications  benzonatate (TESSALON) capsule 200 mg (not administered)  acetaminophen (TYLENOL) tablet 1,000 mg (not administered)  sodium chloride 0.9 % bolus 500 mL (0 mLs Intravenous  Stopped 07/27/16 2313)     Initial Impression / Assessment and Plan / ED Course  I have reviewed the triage vital signs and the nursing notes.  Pertinent labs & imaging results that were available during my care of the patient were reviewed by me and considered in my medical decision making (see chart for details).  Clinical Course     Patient presenting for cough x 2 weeks productive of green sputum with associated nasal congestion. Pt CXR negative for acute infiltrate. No fever, leukocytosis, or hypoxia. No c/o SOB. Patient with mild tachycardia, but hx of atrial tachycardia which is intermittent; per past cardiology notes. Patients symptoms are consistent with upper respiratory infection. Will manage with Z-pack and Hycodan. It is also possible that persistent cough may be 2/2 lisinopril. For this reason, strongly advise PCP f/u if symptoms persist. Return precautions given at discharge. Patient discharged in stable condition with no unaddressed concerns. Patient seen and evaluated also by my attending, Dr. Johnney Killian, who is in agreement with this workup, assessment, management plan, and patient's stability for discharge.   Final Clinical Impressions(s) / ED Diagnoses   Final diagnoses:   Upper respiratory tract infection, unspecified type  Cough    New Prescriptions New Prescriptions   AZITHROMYCIN (ZITHROMAX) 250 MG TABLET    Take 1 tablet (250 mg total) by mouth daily. Take first 2 tablets together, then 1 every day until finished.   HYDROCODONE-HOMATROPINE (HYCODAN) 5-1.5 MG/5ML SYRUP    Take 5 mLs by mouth every 8 (eight) hours as needed for cough.     Antonietta Breach, PA-C 07/27/16 2322    Charlesetta Shanks, MD 07/28/16 [phone]

## 2016-07-27 NOTE — ED Notes (Signed)
Pt from home, brought in by EMS with c/o chest congestion, cough, and pain only when he coughs. 92% on RA, EMS placed him on 2L [place]. Pt  Does not wear home O2. A&OX4.

## 2016-08-03 DIAGNOSIS — J069 Acute upper respiratory infection, unspecified: Secondary | ICD-10-CM | POA: Diagnosis not present

## 2016-08-03 DIAGNOSIS — R05 Cough: Secondary | ICD-10-CM | POA: Diagnosis not present

## 2016-10-08 ENCOUNTER — Other Ambulatory Visit: Payer: Self-pay | Admitting: Interventional Cardiology

## 2016-10-08 MED ORDER — TRIAMTERENE-HCTZ 75-50 MG PO TABS: ORAL_TABLET | ORAL | 1 refills | Status: DC

## 2016-10-13 ENCOUNTER — Telehealth: Payer: Self-pay

## 2016-10-13 NOTE — Telephone Encounter (Signed)
Prior auth for Digoxin 0.25mg  submitted to [facility]. Pending a review.

## 2016-10-14 NOTE — Telephone Encounter (Signed)
**Note De-Identified  Obfuscation** We received a PA approval from [facility] on the pts Digoxin. Approval good until 10/14/2018.  I faxed approval letter to [facility] at [phone].

## 2016-10-27 DIAGNOSIS — H6120 Impacted cerumen, unspecified ear: Secondary | ICD-10-CM | POA: Diagnosis not present

## 2016-10-27 DIAGNOSIS — H9190 Unspecified hearing loss, unspecified ear: Secondary | ICD-10-CM | POA: Diagnosis not present

## 2016-11-17 DIAGNOSIS — H906 Mixed conductive and sensorineural hearing loss, bilateral: Secondary | ICD-10-CM | POA: Diagnosis not present

## 2016-12-29 DIAGNOSIS — K529 Noninfective gastroenteritis and colitis, unspecified: Secondary | ICD-10-CM | POA: Diagnosis not present

## 2016-12-29 DIAGNOSIS — E782 Mixed hyperlipidemia: Secondary | ICD-10-CM | POA: Diagnosis not present

## 2016-12-29 DIAGNOSIS — I10 Essential (primary) hypertension: Secondary | ICD-10-CM | POA: Diagnosis not present

## 2016-12-29 DIAGNOSIS — I2510 Atherosclerotic heart disease of native coronary artery without angina pectoris: Secondary | ICD-10-CM | POA: Diagnosis not present

## 2016-12-29 DIAGNOSIS — Z0000 Encounter for general adult medical examination without abnormal findings: Secondary | ICD-10-CM | POA: Diagnosis not present

## 2017-02-11 ENCOUNTER — Telehealth: Payer: Self-pay | Admitting: Interventional Cardiology

## 2017-02-11 MED ORDER — CLOPIDOGREL BISULFATE 75 MG PO TABS: 75.0000 mg | ORAL_TABLET | Freq: Every day | ORAL | 0 refills | Status: DC

## 2017-02-11 MED ORDER — AMLODIPINE BESYLATE 5 MG PO TABS: 5.0000 mg | ORAL_TABLET | Freq: Every day | ORAL | 0 refills | Status: DC

## 2017-02-11 MED ORDER — DIGOXIN 250 MCG PO TABS: 0.2500 mg | ORAL_TABLET | Freq: Every day | ORAL | 0 refills | Status: DC

## 2017-02-11 MED ORDER — PRAVASTATIN SODIUM 20 MG PO TABS: 20.0000 mg | ORAL_TABLET | Freq: Every day | ORAL | 0 refills | Status: DC

## 2017-02-11 NOTE — Telephone Encounter (Signed)
Pt's medications were sent to pt's pharmacy as requested. Confirmation received.  

## 2017-02-11 NOTE — Telephone Encounter (Signed)
New message     *STAT* If patient is at the pharmacy, call can be transferred to refill team.   1. Which medications need to be refilled? (please list name of each medication and dose if known) pravastatin 20 mg, clopidogrel 75 mg, digoxin 0.25 mg, amlodipine 5 mg  2. Which pharmacy/location (including street and city if local pharmacy) is medication to be sent to? [facility] on [address]   3. Do they need a 30 day or 90 day supply? 30 day

## 2017-03-15 NOTE — Progress Notes (Signed)
Cardiology Office Note   Date:  03/16/2017   ID:  Jeffrey Zimmerman, DOB 07-Jun-1936, MRN [id number]  PCP:  Gaynelle Arabian, MD    No chief complaint on file.  CAD  Wt Readings from Last 3 Encounters:  03/16/17 160 lb 6.4 oz (72.8 kg)  04/10/16 153 lb 12.8 oz (69.8 kg)  07/05/14 147 lb 3.2 oz (66.8 kg)       History of Present Illness: Jeffrey Zimmerman is a 81 y.o. male  With a h/o anterior STEMI in 2011 with several LAD stents placed.  He has had Atrial tachycardia: Noted by monitor in 2013.  He has had issues with leg edema.  Denies : Chest pain. Dizziness. Leg edema. Nitroglycerin use. Orthopnea. Palpitations. Paroxysmal nocturnal dyspnea. Shortness of breath. Syncope.   He has not been taking lisinopril for a few months due to not getting it from the pharmacy.    He feels that he gets diarrhea from digoxin.  He is still taking this.  Swelling has been better.  He has some knee problem and swelling at the knee joints.    Past Medical History:  Diagnosis Date  . Adenomatous colon polyp 2/10  . Basal cell carcinoma   . Cancer ([place])    colon  . Coronary artery disease    04/21/10 Anter MI with BMS to LAD with residual proximal LAD EF 40%  . Gout   . History of cholecystectomy   . History of colonoscopy    1/5 AND 2/10  . Hypertension   . Irritable bowel syndrome   . Myocardial infarct ([place])   . SVT (supraventricular tachycardia) (HCC)     Past Surgical History:  Procedure Laterality Date  . APPENDECTOMY    . CARDIAC CATHETERIZATION     04/21/10 Anter MI with BMS to LAD with residual proximal LAD EF 40%  . KNEE ARTHOSCOPY Bilateral   . PARTIAL LEFT NEPHRECTOMY     (for oncocytoma-benign)     Current Outpatient Prescriptions  Medication Sig Dispense Refill  . amLODipine (NORVASC) 5 MG tablet Take 1 tablet (5 mg total) by mouth daily. 90 tablet 0  . azithromycin (ZITHROMAX) 250 MG tablet Take 1 tablet (250 mg total) by mouth daily. Take first 2 tablets  together, then 1 every day until finished. 6 tablet 0  . clopidogrel (PLAVIX) 75 MG tablet Take 1 tablet (75 mg total) by mouth daily. 90 tablet 0  . digoxin (LANOXIN) 0.25 MG tablet Take 1 tablet (0.25 mg total) by mouth daily. 90 tablet 0  . HYDROcodone-homatropine (HYCODAN) 5-1.5 MG/5ML syrup Take 5 mLs by mouth every 8 (eight) hours as needed for cough. 120 mL 0  . lisinopril (PRINIVIL,ZESTRIL) 20 MG tablet Take 1 tablet (20 mg total) by mouth daily. 90 tablet 3  . nitroGLYCERIN (NITROSTAT) 0.4 MG SL tablet Place 1 tablet (0.4 mg total) under the tongue every 5 (five) minutes as needed for chest pain. 75 tablet 1  . pravastatin (PRAVACHOL) 20 MG tablet Take 1 tablet (20 mg total) by mouth daily. 90 tablet 0  . triamterene-hydrochlorothiazide (MAXZIDE) 75-50 MG tablet TAKE 1/2 TABLET BY MOUTH EVERY OTHER DAY 90 tablet 1   No current facility-administered medications for this visit.     Allergies:   Codeine   Social History:  The patient  reports that he has never smoked. He has never used smokeless tobacco. He reports that he does not drink alcohol or use drugs.   Family History:  The  patient's family history includes CAD in his mother; Colon cancer in his father; Heart attack in his mother; Rheum arthritis in his maternal grandfather.    ROS:  Please see the history of present illness.   Otherwise, review of systems are positive for diarrhea, weight gain.   All other systems are reviewed and negative.    PHYSICAL EXAM: VS:  BP 130/78   Pulse 84   Ht 5\' 11"  (1.803 m)   Wt 160 lb 6.4 oz (72.8 kg)   SpO2 95%   BMI 22.37 kg/m  , BMI Body mass index is 22.37 kg/m. GEN: Well nourished, well developed, in no acute distress  HEENT: normal  Neck: no JVD, carotid bruits, or masses Cardiac: RRR; no murmurs, rubs, or gallops,no edema  Respiratory:  clear to auscultation bilaterally, normal work of breathing GI: soft, nontender, nondistended, + BS MS: no deformity or atrophy  Skin: warm  and dry, no rash Neuro:  Strength and sensation are intact Psych: euthymic mood, full affect   EKG:   The ekg ordered today demonstrates NSR, NSST   Recent Labs: 04/10/2016: ALT 11 07/27/2016: BUN 19; Creatinine, Ser 1.10; Hemoglobin 13.0; Platelets 146; Potassium 3.5; Sodium 134   Lipid Panel    Component Value Date/Time   CHOL 161 04/10/2016 0951   TRIG 107 04/10/2016 0951   HDL 52 04/10/2016 0951   CHOLHDL 3.1 04/10/2016 0951   VLDL 21 04/10/2016 0951   LDLCALC 88 04/10/2016 0951     Other studies Reviewed: Additional studies/ records that were reviewed today with results demonstrating: .   ASSESSMENT AND PLAN:  1. CAD/Old MI: No angina. Continue aggressive secondary prevention.   2. HTN: Restart lisinopril 10 mg daily.  COuld stop amlodipine if BP drops.  He will be seeing Dr. Marisue Humble in the next month.   3. Atrial tachycardia/SVT: Noted by monitor in 2013.  Stop digoxin due to diarrhea.  Start metoprolol 12.5 BID. 4. Edema:  Improved. 5. Hyperlipidemia: Ha had some blood tests last week.  Will try to get results.    Current medicines are reviewed at length with the patient today.  The patient concerns regarding his medicines were addressed.  The following changes have been made:  As above  Labs/ tests ordered today include:  No orders of the defined types were placed in this encounter.   Recommend 150 minutes/week of aerobic exercise Low fat, low carb, high fiber diet recommended  Disposition:   FU in 1 year   Signed, Larae Grooms, MD  03/16/2017 8:55 AM    [facility], [place], [place]  [address] Phone: [phone]; Fax: [phone]

## 2017-03-16 ENCOUNTER — Ambulatory Visit (INDEPENDENT_AMBULATORY_CARE_PROVIDER_SITE_OTHER): Payer: Medicare Other | Admitting: Interventional Cardiology

## 2017-03-16 ENCOUNTER — Encounter: Payer: Self-pay | Admitting: Interventional Cardiology

## 2017-03-16 VITALS — BP 130/78 | HR 84 | Ht 71.0 in | Wt 160.4 lb

## 2017-03-16 DIAGNOSIS — I471 Supraventricular tachycardia: Secondary | ICD-10-CM

## 2017-03-16 DIAGNOSIS — I2510 Atherosclerotic heart disease of native coronary artery without angina pectoris: Secondary | ICD-10-CM

## 2017-03-16 DIAGNOSIS — I10 Essential (primary) hypertension: Secondary | ICD-10-CM

## 2017-03-16 DIAGNOSIS — E782 Mixed hyperlipidemia: Secondary | ICD-10-CM

## 2017-03-16 DIAGNOSIS — R600 Localized edema: Secondary | ICD-10-CM | POA: Diagnosis not present

## 2017-03-16 MED ORDER — LISINOPRIL 10 MG PO TABS: 10.0000 mg | ORAL_TABLET | Freq: Every day | ORAL | 3 refills | Status: DC

## 2017-03-16 MED ORDER — METOPROLOL TARTRATE 25 MG PO TABS: 12.5000 mg | ORAL_TABLET | Freq: Two times a day (BID) | ORAL | 3 refills | Status: DC

## 2017-03-16 NOTE — Patient Instructions (Signed)
Medication Instructions:  Your physician has recommended you make the following change in your medication:   1. TAKE Lisinopril 10 mg daily  2. STOP digoxin  3. START metoprolol 12.5 mg (1/2 tablet) twice a day.   Labwork: None ordered  Testing/Procedures: None ordered  Follow-Up: Your physician wants you to follow-up in: 1 year with Dr. Irish Lack. You will receive a reminder letter in the mail two months in advance. If you don't receive a letter, please call our office to schedule the follow-up appointment.   Any Other Special Instructions Will Be Listed Below (If Applicable).     If you need a refill on your cardiac medications before your next appointment, please call your pharmacy.

## 2017-05-09 ENCOUNTER — Other Ambulatory Visit: Payer: Self-pay | Admitting: Interventional Cardiology

## 2017-08-09 ENCOUNTER — Other Ambulatory Visit: Payer: Self-pay | Admitting: Gastroenterology

## 2017-08-09 DIAGNOSIS — R1032 Left lower quadrant pain: Secondary | ICD-10-CM

## 2017-08-16 ENCOUNTER — Inpatient Hospital Stay
Admission: RE | Admit: 2017-08-16 | Discharge: 2017-08-16 | Disposition: A | Discharge status: Home / Self Care | Payer: Medicare Other | Source: Ambulatory Visit | Attending: Gastroenterology | Admitting: Gastroenterology

## 2017-08-23 ENCOUNTER — Other Ambulatory Visit: Payer: Medicare Other

## 2017-09-01 ENCOUNTER — Ambulatory Visit
Admission: RE | Admit: 2017-09-01 | Discharge: 2017-09-01 | Disposition: A | Discharge status: Home / Self Care | Payer: Medicare Other | Source: Ambulatory Visit | Attending: Gastroenterology | Admitting: Gastroenterology

## 2017-09-01 DIAGNOSIS — R1032 Left lower quadrant pain: Secondary | ICD-10-CM

## 2017-09-01 MED ORDER — IOPAMIDOL (ISOVUE-300) INJECTION 61%
100.0000 mL | Freq: Once | INTRAVENOUS | Status: AC | PRN
  Administered 2017-09-01: 100 mL via INTRAVENOUS

## 2017-09-20 ENCOUNTER — Telehealth: Payer: Self-pay | Admitting: Interventional Cardiology

## 2017-09-20 MED ORDER — METOPROLOL TARTRATE 25 MG PO TABS: 12.5000 mg | ORAL_TABLET | Freq: Two times a day (BID) | ORAL | 1 refills | Status: DC

## 2017-09-20 MED ORDER — LISINOPRIL 10 MG PO TABS: 10.0000 mg | ORAL_TABLET | Freq: Every day | ORAL | 1 refills | Status: DC

## 2017-09-20 MED ORDER — PRAVASTATIN SODIUM 20 MG PO TABS: 20.0000 mg | ORAL_TABLET | Freq: Every day | ORAL | 1 refills | Status: DC

## 2017-09-20 MED ORDER — AMLODIPINE BESYLATE 5 MG PO TABS: 5.0000 mg | ORAL_TABLET | Freq: Every day | ORAL | 1 refills | Status: DC

## 2017-09-20 MED ORDER — CLOPIDOGREL BISULFATE 75 MG PO TABS: 75.0000 mg | ORAL_TABLET | Freq: Every day | ORAL | 1 refills | Status: DC

## 2017-09-20 NOTE — Telephone Encounter (Signed)
Pt's medications were sent to pt's pharmacy as requested. Confirmation received.  

## 2017-09-20 NOTE — Telephone Encounter (Signed)
New message      *STAT* If patient is at the pharmacy, call can be transferred to refill team.   1. Which medications need to be refilled? (please list name of each medication and dose if known)   pravastatin (PRAVACHOL) 20 MG tablet TAKE 1 TABLET BY MOUTH EVERY DAY   clopidogrel (PLAVIX) 75 MG tablet TAKE 1 TABLET BY MOUTH EVERY DAY   amLODipine (NORVASC) 5 MG tablet TAKE 1 TABLET BY MOUTH EVERY DAY   metoprolol tartrate (LOPRESSOR) 25 MG tablet(Expired) Take 0.5 tablets (12.5 mg total) by mouth 2 (two) times daily   lisinopril (PRINIVIL,ZESTRIL) 10 MG tablet(Expired) Take 1 tablet (10 mg total) by mouth daily    2. Which pharmacy/location (including street and city if local pharmacy) [facility] [address]   3. Do they need a 30 day or 90 day supply? [place]

## 2017-11-06 ENCOUNTER — Other Ambulatory Visit: Payer: Self-pay | Admitting: Interventional Cardiology

## 2017-11-16 ENCOUNTER — Other Ambulatory Visit: Payer: Self-pay

## 2017-11-16 NOTE — Telephone Encounter (Signed)
ASSESSMENT AND PLAN:  1. CAD/Old MI: No angina. Continue aggressive secondary prevention.   2. HTN: Restart lisinopril 10 mg daily.  COuld stop amlodipine if BP drops.  He will be seeing Dr. Marisue Humble in the next month.   3. Atrial tachycardia/SVT: Noted by monitor in 2013.  Stop digoxin due to diarrhea.  Start metoprolol 12.5 BID. 4. Edema:  Improved. 5. Hyperlipidemia: Ha had some blood tests last week.  Will try to get results.

## 2018-03-14 ENCOUNTER — Other Ambulatory Visit: Payer: Self-pay | Admitting: Gastroenterology

## 2018-03-14 DIAGNOSIS — K862 Cyst of pancreas: Secondary | ICD-10-CM

## 2018-03-18 ENCOUNTER — Other Ambulatory Visit: Payer: Medicare Other

## 2018-03-30 ENCOUNTER — Ambulatory Visit
Admission: RE | Admit: 2018-03-30 | Discharge: 2018-03-30 | Disposition: A | Discharge status: Home / Self Care | Payer: Medicare Other | Source: Ambulatory Visit | Attending: Gastroenterology | Admitting: Gastroenterology

## 2018-03-30 DIAGNOSIS — K862 Cyst of pancreas: Secondary | ICD-10-CM

## 2018-03-30 MED ORDER — IOPAMIDOL (ISOVUE-300) INJECTION 61%
100.0000 mL | Freq: Once | INTRAVENOUS | Status: AC | PRN
  Administered 2018-03-30: 100 mL via INTRAVENOUS

## 2018-06-24 ENCOUNTER — Other Ambulatory Visit: Payer: Self-pay | Admitting: Interventional Cardiology

## 2018-08-12 ENCOUNTER — Other Ambulatory Visit: Payer: Self-pay | Admitting: Interventional Cardiology

## 2018-09-05 ENCOUNTER — Other Ambulatory Visit: Payer: Self-pay | Admitting: Interventional Cardiology

## 2018-09-06 ENCOUNTER — Other Ambulatory Visit: Payer: Self-pay | Admitting: Interventional Cardiology

## 2018-09-26 ENCOUNTER — Other Ambulatory Visit: Payer: Self-pay

## 2018-09-28 ENCOUNTER — Other Ambulatory Visit: Payer: Self-pay

## 2018-09-28 ENCOUNTER — Other Ambulatory Visit: Payer: Self-pay | Admitting: Interventional Cardiology

## 2018-09-28 MED ORDER — METOPROLOL TARTRATE 25 MG PO TABS: 12.5000 mg | ORAL_TABLET | Freq: Two times a day (BID) | ORAL | 0 refills | Status: DC

## 2018-09-28 MED ORDER — CLOPIDOGREL BISULFATE 75 MG PO TABS: 75.0000 mg | ORAL_TABLET | Freq: Every day | ORAL | 0 refills | Status: DC

## 2018-09-28 MED ORDER — PRAVASTATIN SODIUM 20 MG PO TABS: 20.0000 mg | ORAL_TABLET | Freq: Every day | ORAL | 0 refills | Status: DC

## 2018-10-05 ENCOUNTER — Other Ambulatory Visit: Payer: Self-pay | Admitting: Interventional Cardiology

## 2018-10-06 ENCOUNTER — Other Ambulatory Visit: Payer: Self-pay | Admitting: Interventional Cardiology

## 2018-10-20 ENCOUNTER — Other Ambulatory Visit: Payer: Self-pay | Admitting: Interventional Cardiology

## 2018-10-27 ENCOUNTER — Other Ambulatory Visit: Payer: Self-pay | Admitting: Interventional Cardiology

## 2018-10-27 NOTE — Telephone Encounter (Signed)
Left message for pt to call back about clarification on which pharmacy he wants his medications sent to.

## 2018-10-28 NOTE — Telephone Encounter (Signed)
New Message    *STAT* If patient is at the pharmacy, call can be transferred to refill team.   1. Which medications need to be refilled? (please list name of each medication and dose if known) Clopidogrel 75mg , Metoprolol 25mg , and Pravastatin 20mg    2. Which pharmacy/location (including street and city if local pharmacy) is medication to be sent to? [facility], [address] [place], [place] [phone]  3. Do they need a 30 day or 90 day supply?  30 day supply

## 2018-11-01 ENCOUNTER — Other Ambulatory Visit: Payer: Self-pay | Admitting: Interventional Cardiology

## 2018-11-10 ENCOUNTER — Ambulatory Visit: Payer: Medicare Other | Admitting: Interventional Cardiology

## 2018-12-23 DIAGNOSIS — N401 Enlarged prostate with lower urinary tract symptoms: Secondary | ICD-10-CM | POA: Diagnosis not present

## 2018-12-23 DIAGNOSIS — I252 Old myocardial infarction: Secondary | ICD-10-CM | POA: Diagnosis not present

## 2018-12-23 DIAGNOSIS — I208 Other forms of angina pectoris: Secondary | ICD-10-CM | POA: Diagnosis not present

## 2018-12-23 DIAGNOSIS — I2510 Atherosclerotic heart disease of native coronary artery without angina pectoris: Secondary | ICD-10-CM | POA: Diagnosis not present

## 2018-12-23 DIAGNOSIS — D3002 Benign neoplasm of left kidney: Secondary | ICD-10-CM | POA: Diagnosis not present

## 2018-12-23 DIAGNOSIS — I119 Hypertensive heart disease without heart failure: Secondary | ICD-10-CM | POA: Diagnosis not present

## 2019-01-23 ENCOUNTER — Other Ambulatory Visit: Payer: Self-pay | Admitting: Interventional Cardiology

## 2019-01-23 DIAGNOSIS — I119 Hypertensive heart disease without heart failure: Secondary | ICD-10-CM | POA: Diagnosis not present

## 2019-01-23 DIAGNOSIS — Z0000 Encounter for general adult medical examination without abnormal findings: Secondary | ICD-10-CM | POA: Diagnosis not present

## 2019-01-23 DIAGNOSIS — D692 Other nonthrombocytopenic purpura: Secondary | ICD-10-CM | POA: Diagnosis not present

## 2019-01-23 DIAGNOSIS — Z1389 Encounter for screening for other disorder: Secondary | ICD-10-CM | POA: Diagnosis not present

## 2019-01-23 DIAGNOSIS — N401 Enlarged prostate with lower urinary tract symptoms: Secondary | ICD-10-CM | POA: Diagnosis not present

## 2019-01-23 DIAGNOSIS — I2510 Atherosclerotic heart disease of native coronary artery without angina pectoris: Secondary | ICD-10-CM | POA: Diagnosis not present

## 2019-01-23 DIAGNOSIS — H9190 Unspecified hearing loss, unspecified ear: Secondary | ICD-10-CM | POA: Diagnosis not present

## 2019-02-09 DIAGNOSIS — I119 Hypertensive heart disease without heart failure: Secondary | ICD-10-CM | POA: Diagnosis not present

## 2019-02-09 DIAGNOSIS — I252 Old myocardial infarction: Secondary | ICD-10-CM | POA: Diagnosis not present

## 2019-02-09 DIAGNOSIS — I208 Other forms of angina pectoris: Secondary | ICD-10-CM | POA: Diagnosis not present

## 2019-02-09 DIAGNOSIS — D3002 Benign neoplasm of left kidney: Secondary | ICD-10-CM | POA: Diagnosis not present

## 2019-02-09 DIAGNOSIS — N401 Enlarged prostate with lower urinary tract symptoms: Secondary | ICD-10-CM | POA: Diagnosis not present

## 2019-02-09 DIAGNOSIS — I2510 Atherosclerotic heart disease of native coronary artery without angina pectoris: Secondary | ICD-10-CM | POA: Diagnosis not present

## 2019-02-14 DIAGNOSIS — H35372 Puckering of macula, left eye: Secondary | ICD-10-CM | POA: Diagnosis not present

## 2019-02-14 DIAGNOSIS — H2513 Age-related nuclear cataract, bilateral: Secondary | ICD-10-CM | POA: Diagnosis not present

## 2019-02-14 DIAGNOSIS — H5703 Miosis: Secondary | ICD-10-CM | POA: Diagnosis not present

## 2019-02-14 DIAGNOSIS — H2181 Floppy iris syndrome: Secondary | ICD-10-CM | POA: Diagnosis not present

## 2019-02-26 NOTE — Progress Notes (Signed)
Cardiology Office Note   Date:  02/27/2019   ID:  Jeffrey Zimmerman, DOB [birth year], MRN [id number]  PCP:  Gaynelle Arabian, MD    No chief complaint on file.  CAD  Wt Readings from Last 3 Encounters:  02/27/19 183 lb 6.4 oz (83.2 kg)  03/16/17 160 lb 6.4 oz (72.8 kg)  04/10/16 153 lb 12.8 oz (69.8 kg)       History of Present Illness: Jeffrey Zimmerman is a 83 y.o. male  With a h/o anterior STEMI in 2011 with several LAD stents placed.  He has had Atrial tachycardia: Noted by monitor in 2013.  He has had issues with leg edema.  He has some knee problem and swelling at the knee joints.  Denies : Chest pain. Dizziness. Leg edema. Nitroglycerin use. Orthopnea. Palpitations. Paroxysmal nocturnal dyspnea. Shortness of breath. Syncope.   Wife reports that he drink a lot of Coke, not water.  Walking is limited, but he does most of it in the house.  Appetite has increase so he has gained weight.  Past Medical History:  Diagnosis Date  . Adenomatous colon polyp 2/10  . Basal cell carcinoma   . Cancer ([place])    colon  . Coronary artery disease    04/21/10 Anter MI with BMS to LAD with residual proximal LAD EF 40%  . Gout   . History of cholecystectomy   . History of colonoscopy    1/5 AND 2/10  . Hypertension   . Irritable bowel syndrome   . Myocardial infarct ([place])   . SVT (supraventricular tachycardia) ([facility])     Past Surgical History:  Procedure Laterality Date  . APPENDECTOMY    . CARDIAC CATHETERIZATION     04/21/10 Anter MI with BMS to LAD with residual proximal LAD EF 40%  . KNEE ARTHOSCOPY Bilateral   . PARTIAL LEFT NEPHRECTOMY     (for oncocytoma-benign)     Current Outpatient Medications  Medication Sig Dispense Refill  . amLODipine (NORVASC) 5 MG tablet TAKE 1 TABLET BY MOUTH DAILY 30 tablet 0  . azithromycin (ZITHROMAX) 250 MG tablet Take 1 tablet (250 mg total) by mouth daily. Take first 2 tablets together, then 1 every day until finished. 6 tablet 0  .  clopidogrel (PLAVIX) 75 MG tablet TAKE 1 TABLET BY MOUTH DAILY 30 tablet 0  . digoxin (LANOXIN) 0.25 MG tablet TAKE 1 TABLET (0.25 MG TOTAL) BY MOUTH DAILY. 90 tablet 2  . HYDROcodone-homatropine (HYCODAN) 5-1.5 MG/5ML syrup Take 5 mLs by mouth every 8 (eight) hours as needed for cough. 120 mL 0  . lisinopril (PRINIVIL,ZESTRIL) 10 MG tablet TAKE 1 TABLET BY MOUTH EVERY DAY 90 tablet 1  . metoprolol tartrate (LOPRESSOR) 25 MG tablet TAKE 1/2 TABLET BY MOUTH 2 TIMES DAILY 30 tablet 0  . nitroGLYCERIN (NITROSTAT) 0.4 MG SL tablet Place 1 tablet (0.4 mg total) under the tongue every 5 (five) minutes as needed for chest pain. 75 tablet 1  . pravastatin (PRAVACHOL) 20 MG tablet TAKE 1 TABLET BY MOUTH DAILY 30 tablet 0  . triamterene-hydrochlorothiazide (MAXZIDE) 75-50 MG tablet TAKE 1/2 TABLET BY MOUTH EVERY OTHER DAY 90 tablet 1   No current facility-administered medications for this visit.     Allergies:   Codeine    Social History:  The patient  reports that he has never smoked. He has never used smokeless tobacco. He reports that he does not drink alcohol or use drugs.   Family History:  The  patient's family history includes CAD in his mother; Colon cancer in his father; Heart attack in his mother; Rheum arthritis in his maternal grandfather.    ROS:  Please see the history of present illness.   Otherwise, review of systems are positive for weight gain.   All other systems are reviewed and negative.    PHYSICAL EXAM: VS:  BP (!) 142/80   Pulse 77   Ht 5\' 11"  (1.803 m)   Wt 183 lb 6.4 oz (83.2 kg)   SpO2 91%   BMI 25.58 kg/m  , BMI Body mass index is 25.58 kg/m. GEN: Well nourished, well developed, in no acute distress  HEENT: normal  Neck: no JVD, carotid bruits, or masses Cardiac: RRR; no murmurs, rubs, or gallops,no edema  Respiratory:  clear to auscultation bilaterally, normal work of breathing GI: soft, nontender, nondistended, + BS MS: no deformity or atrophy  Skin: warm and  dry, no rash Neuro:  Strength and sensation are intact Psych: euthymic mood, full affect   EKG:   The ekg ordered today demonstrates NSR, no ST changes   Recent Labs: No results found for requested labs within last 8760 hours.   Lipid Panel    Component Value Date/Time   CHOL 161 04/10/2016 0951   TRIG 107 04/10/2016 0951   HDL 52 04/10/2016 0951   CHOLHDL 3.1 04/10/2016 0951   VLDL 21 04/10/2016 0951   LDLCALC 88 04/10/2016 0951     Other studies Reviewed: Additional studies/ records that were reviewed today with results demonstrating: labs reviewed.   ASSESSMENT AND PLAN:  1. CAD: No angina. Continue aggressive secondary prevention.   2. He states he stopped Digoxin, so we will not check a level.  WIll also check with pharmacy if he is still taking.  3. LDL 85 in 01/2019. The current medical regimen is effective;  continue present plan and medications for hyperlipidemia. 4. HTN: The current medical regimen is effective;  continue present plan and medications. 5. Atrial tach: No sx recently.   Wear mask and maintain distancing  Current medicines are reviewed at length with the patient today.  The patient concerns regarding his medicines were addressed.  The following changes have been made:  No change  Labs/ tests ordered today include:  No orders of the defined types were placed in this encounter.   Recommend 150 minutes/week of aerobic exercise Low fat, low carb, high fiber diet recommended  Disposition:   FU in 1 year   Signed, Larae Grooms, MD  02/27/2019 4:50 PM    [facility] [place], [place], [place]  [address] Phone: [phone]; Fax: [phone]

## 2019-02-27 ENCOUNTER — Other Ambulatory Visit: Payer: Self-pay | Admitting: Interventional Cardiology

## 2019-02-27 ENCOUNTER — Ambulatory Visit (INDEPENDENT_AMBULATORY_CARE_PROVIDER_SITE_OTHER): Payer: Medicare Other | Admitting: Interventional Cardiology

## 2019-02-27 ENCOUNTER — Encounter: Payer: Self-pay | Admitting: Interventional Cardiology

## 2019-02-27 ENCOUNTER — Other Ambulatory Visit: Payer: Self-pay

## 2019-02-27 VITALS — BP 142/80 | HR 77 | Ht 71.0 in | Wt 183.4 lb

## 2019-02-27 DIAGNOSIS — I10 Essential (primary) hypertension: Secondary | ICD-10-CM

## 2019-02-27 DIAGNOSIS — E782 Mixed hyperlipidemia: Secondary | ICD-10-CM

## 2019-02-27 DIAGNOSIS — I2510 Atherosclerotic heart disease of native coronary artery without angina pectoris: Secondary | ICD-10-CM | POA: Diagnosis not present

## 2019-02-27 DIAGNOSIS — I471 Supraventricular tachycardia: Secondary | ICD-10-CM | POA: Diagnosis not present

## 2019-02-27 MED ORDER — PRAVASTATIN SODIUM 20 MG PO TABS: 20.0000 mg | ORAL_TABLET | Freq: Every day | ORAL | 3 refills | Status: DC

## 2019-02-27 MED ORDER — CLOPIDOGREL BISULFATE 75 MG PO TABS: 75.0000 mg | ORAL_TABLET | Freq: Every day | ORAL | 3 refills | Status: DC

## 2019-02-27 NOTE — Patient Instructions (Addendum)

## 2019-03-28 ENCOUNTER — Other Ambulatory Visit: Payer: Self-pay | Admitting: Interventional Cardiology

## 2019-05-19 ENCOUNTER — Other Ambulatory Visit: Payer: Self-pay | Admitting: Interventional Cardiology

## 2019-06-08 ENCOUNTER — Other Ambulatory Visit: Payer: Self-pay | Admitting: Gastroenterology

## 2019-06-08 DIAGNOSIS — K862 Cyst of pancreas: Secondary | ICD-10-CM

## 2019-06-14 ENCOUNTER — Other Ambulatory Visit: Payer: Self-pay | Admitting: Interventional Cardiology

## 2019-06-21 ENCOUNTER — Other Ambulatory Visit: Payer: Self-pay

## 2019-06-21 ENCOUNTER — Ambulatory Visit
Admission: RE | Admit: 2019-06-21 | Discharge: 2019-06-21 | Disposition: A | Discharge status: Home / Self Care | Payer: Medicare Other | Source: Ambulatory Visit | Attending: Gastroenterology | Admitting: Gastroenterology

## 2019-06-21 DIAGNOSIS — K862 Cyst of pancreas: Secondary | ICD-10-CM

## 2019-06-21 MED ORDER — IOPAMIDOL (ISOVUE-300) INJECTION 61%
100.0000 mL | Freq: Once | INTRAVENOUS | Status: AC | PRN
  Administered 2019-06-21: 100 mL via INTRAVENOUS

## 2019-07-24 ENCOUNTER — Other Ambulatory Visit: Payer: Self-pay | Admitting: Interventional Cardiology

## 2019-08-11 ENCOUNTER — Other Ambulatory Visit: Payer: Self-pay | Admitting: Interventional Cardiology

## 2019-10-30 ENCOUNTER — Other Ambulatory Visit: Payer: Self-pay | Admitting: Interventional Cardiology

## 2019-11-17 ENCOUNTER — Other Ambulatory Visit: Payer: Self-pay | Admitting: Interventional Cardiology

## 2019-11-20 ENCOUNTER — Other Ambulatory Visit: Payer: Self-pay

## 2019-11-20 ENCOUNTER — Emergency Department (HOSPITAL_COMMUNITY)
Admission: EM | Admit: 2019-11-20 | Discharge: 2019-11-20 | Disposition: A | Discharge status: Home / Self Care | Payer: Medicare Other | Attending: Emergency Medicine | Admitting: Emergency Medicine

## 2019-11-20 ENCOUNTER — Emergency Department (HOSPITAL_COMMUNITY): Payer: Medicare Other

## 2019-11-20 ENCOUNTER — Encounter (HOSPITAL_COMMUNITY): Payer: Self-pay

## 2019-11-20 DIAGNOSIS — M25562 Pain in left knee: Secondary | ICD-10-CM | POA: Insufficient documentation

## 2019-11-20 DIAGNOSIS — Z85828 Personal history of other malignant neoplasm of skin: Secondary | ICD-10-CM | POA: Insufficient documentation

## 2019-11-20 DIAGNOSIS — Y9301 Activity, walking, marching and hiking: Secondary | ICD-10-CM | POA: Insufficient documentation

## 2019-11-20 DIAGNOSIS — I252 Old myocardial infarction: Secondary | ICD-10-CM | POA: Insufficient documentation

## 2019-11-20 DIAGNOSIS — G4489 Other headache syndrome: Secondary | ICD-10-CM | POA: Diagnosis not present

## 2019-11-20 DIAGNOSIS — Z7982 Long term (current) use of aspirin: Secondary | ICD-10-CM | POA: Insufficient documentation

## 2019-11-20 DIAGNOSIS — Y9259 Other trade areas as the place of occurrence of the external cause: Secondary | ICD-10-CM | POA: Insufficient documentation

## 2019-11-20 DIAGNOSIS — W19XXXA Unspecified fall, initial encounter: Secondary | ICD-10-CM

## 2019-11-20 DIAGNOSIS — Y998 Other external cause status: Secondary | ICD-10-CM | POA: Diagnosis not present

## 2019-11-20 DIAGNOSIS — I10 Essential (primary) hypertension: Secondary | ICD-10-CM | POA: Diagnosis not present

## 2019-11-20 DIAGNOSIS — W108XXA Fall (on) (from) other stairs and steps, initial encounter: Secondary | ICD-10-CM | POA: Insufficient documentation

## 2019-11-20 LAB — BASIC METABOLIC PANEL
Anion gap: 13 (ref 5–15)
BUN: 12 mg/dL (ref 8–23)
CO2: 27 mmol/L (ref 22–32)
Calcium: 9.6 mg/dL (ref 8.9–10.3)
Chloride: 102 mmol/L (ref 98–111)
Creatinine, Ser: 1 mg/dL (ref 0.61–1.24)
GFR calc Af Amer: >60 mL/min (ref >60)
GFR calc non Af Amer: >60 mL/min (ref >60)
Glucose, Bld: 94 mg/dL (ref 70–99)
Potassium: 3.6 mmol/L (ref 3.5–5.1)
Sodium: 142 mmol/L (ref 135–145)

## 2019-11-20 LAB — CBC
HCT: 48.6 % (ref 39.0–52.0)
Hemoglobin: 16.6 g/dL (ref 13.0–17.0)
MCH: 30.8 pg (ref 26.0–34.0)
MCHC: 34.2 g/dL (ref 30.0–36.0)
MCV: 90.2 fL (ref 80.0–100.0)
Platelets: 283 10*3/uL (ref 150–400)
RBC: 5.39 MIL/uL (ref 4.22–5.81)
RDW: 13.5 % (ref 11.5–15.5)
WBC: 7.6 10*3/uL (ref 4.0–10.5)
nRBC: 0 % (ref 0.0–0.2)

## 2019-11-20 LAB — URINALYSIS, ROUTINE W REFLEX MICROSCOPIC
Bacteria, UA: NONE SEEN
Bilirubin Urine: NEGATIVE
Glucose, UA: NEGATIVE mg/dL
Hgb urine dipstick: NEGATIVE
Ketones, ur: NEGATIVE mg/dL
Leukocytes,Ua: NEGATIVE
Nitrite: NEGATIVE
Protein, ur: 100 mg/dL — AB
Specific Gravity, Urine: 1.013 (ref 1.005–1.030)
pH: 6 (ref 5.0–8.0)

## 2019-11-20 MED ORDER — NITROGLYCERIN 0.4 MG SL SUBL
0.4000 mg | SUBLINGUAL_TABLET | Freq: Once | SUBLINGUAL | Status: AC
  Administered 2019-11-20: 0.4 mg via SUBLINGUAL
  Filled 2019-11-20: qty 1

## 2019-11-20 MED ORDER — METOPROLOL TARTRATE 25 MG PO TABS
12.5000 mg | ORAL_TABLET | Freq: Once | ORAL | Status: AC
  Administered 2019-11-20: 12.5 mg via ORAL
  Filled 2019-11-20: qty 1

## 2019-11-20 MED ORDER — AMLODIPINE BESYLATE 5 MG PO TABS
5.0000 mg | ORAL_TABLET | Freq: Once | ORAL | Status: AC
  Administered 2019-11-20: 18:00:00 5 mg via ORAL
  Filled 2019-11-20: qty 1

## 2019-11-20 NOTE — ED Provider Notes (Signed)
[facility] EMERGENCY DEPARTMENT Provider Note   CSN: [id number] Arrival date & time: 11/20/19  1304     History Chief Complaint  Patient presents with  . Fall    Jeffrey Zimmerman is a 84 y.o. male.  HPI Patient presents after a fall at the mall.  He states that he was walking up some steps when he slipped.  He fell striking his left knee, right arm, and right side of head.  Fall was witnessed by people at the mall.  EMS was called.  EMS reports clamminess and tachycardia on scene.  Upon arrival in the ED, patient reports some residual pain in his left knee and a global headache.  He denies loss of consciousness on the scene.  He states that he normally walks with a walker or a cane.  At the mall today, he did not have either.  He denies any chest pain, shortness of breath, acute vision changes, areas of numbness, weakness, or paresthesias.  He states that he has taken all of his medications today.  History is limited due to the patient's hearing loss.     Past Medical History:  Diagnosis Date  . Adenomatous colon polyp 2/10  . Basal cell carcinoma   . Cancer ([place])    colon  . Coronary artery disease    04/21/10 Anter MI with BMS to LAD with residual proximal LAD EF 40%  . Gout   . History of cholecystectomy   . History of colonoscopy    1/5 AND 2/10  . Hypertension   . Irritable bowel syndrome   . Myocardial infarct ([place])   . SVT (supraventricular tachycardia) [facility])     Patient Active Problem List   Diagnosis Date Noted  . Bilateral lower extremity edema 04/10/2016  . Old myocardial infarction 08/10/2013  . Mixed hyperlipidemia 08/10/2013  . Other malaise and fatigue 08/08/2013  . Hypertension   . Coronary artery disease   . Cancer ([place])   . Basal cell carcinoma   . Gout   . Irritable bowel syndrome   . SVT (supraventricular tachycardia) ([facility])     Past Surgical History:  Procedure Laterality Date  . APPENDECTOMY    . CARDIAC CATHETERIZATION     04/21/10 Anter MI with BMS to LAD with residual proximal LAD EF 40%  . KNEE ARTHOSCOPY Bilateral   . PARTIAL LEFT NEPHRECTOMY     (for oncocytoma-benign)       Family History  Problem Relation Age of Onset  . Heart attack Mother   . CAD Mother   . Colon cancer Father   . Rheum arthritis Maternal Grandfather     Social History   Tobacco Use  . Smoking status: Never Smoker  . Smokeless tobacco: Never Used  Substance Use Topics  . Alcohol use: No  . Drug use: No    Home Medications Prior to Admission medications   Medication Sig Start Date End Date Taking? Authorizing Provider  amLODipine (NORVASC) 5 MG tablet TAKE 1 TABLET BY MOUTH DAILY Patient taking differently: Take 5 mg by mouth daily.  03/28/19  Yes [name], MD  aspirin EC 81 MG tablet Take 81 mg by mouth daily.   Yes [provider]  lisinopril (ZESTRIL) 10 MG tablet Take 1 tablet (10 mg total) by mouth daily. Please make yearly appt with Dr. Irish Lack for August for future refills. 1st attempt 11/17/19  Yes [name], MD  metoprolol tartrate (LOPRESSOR) 25 MG tablet TAKE  1/2 TABLET BY MOUTH TWICE A DAY Patient taking differently: Take 12.5 mg by mouth 2 (two) times daily.  07/24/19  Yes [name], MD  nitroGLYCERIN (NITROSTAT) 0.4 MG SL tablet Place 1 tablet (0.4 mg total) under the tongue every 5 (five) minutes as needed for chest pain. 05/13/16  Yes [name], MD  pravastatin (PRAVACHOL) 20 MG tablet TAKE 1 TABLET BY MOUTH EVERY DAY Patient taking differently: Take 20 mg by mouth daily.  08/11/19  Yes [name], MD  tamsulosin (FLOMAX) 0.4 MG CAPS capsule Take 0.4 mg by mouth daily. 11/19/19  Yes [provider]  clopidogrel (PLAVIX) 75 MG tablet Take 1 tablet (75 mg total) by mouth daily. Patient not taking: Reported on 11/20/2019 02/27/19   [name], MD  HYDROcodone-homatropine [facility]) 5-1.5 MG/5ML syrup Take 5 mLs by mouth every 8 (eight)  hours as needed for cough. Patient not taking: Reported on 11/20/2019 07/27/16   Antonietta Breach, PA-C  triamterene-hydrochlorothiazide (MAXZIDE) 75-50 MG tablet TAKE 1/2 TABLET BY MOUTH EVERY OTHER DAY Patient not taking: Reported on 11/20/2019 10/08/16   [name], MD    Allergies    Codeine  Review of Systems   Review of Systems  Constitutional: Negative for activity change, appetite change, chills, fatigue and fever.  HENT: Positive for hearing loss (Chronic). Negative for congestion, ear pain and sore throat.   Eyes: Negative for pain and visual disturbance.  Respiratory: Negative for cough, chest tightness, shortness of breath and wheezing.   Cardiovascular: Negative for chest pain, palpitations and leg swelling.  Gastrointestinal: Negative for abdominal pain, diarrhea, nausea and vomiting.  Genitourinary: Negative for dysuria and hematuria.  Musculoskeletal: Positive for arthralgias (Chronic). Negative for back pain, joint swelling, myalgias, neck pain and neck stiffness.  Skin: Negative for color change, rash and wound.  Neurological: Negative for dizziness, seizures, syncope, weakness, light-headedness and headaches.  Hematological: Does not bruise/bleed easily.  Psychiatric/Behavioral: Negative for confusion and decreased concentration.  All other systems reviewed and are negative.   Physical Exam Updated Vital Signs BP (!) 157/98   Pulse 87   Temp 98.9 F (37.2 C)   Resp 17   Ht 5\' 10"  (1.778 m)   Wt 83.9 kg   SpO2 95%   BMI 26.54 kg/m   Physical Exam Vitals and nursing note reviewed.  Constitutional:      General: He is not in acute distress.    Appearance: Normal appearance. He is well-developed and normal weight. He is not ill-appearing, toxic-appearing or diaphoretic.  HENT:     Head: Normocephalic and atraumatic.     Right Ear: External ear normal.     Left Ear: External ear normal.     Nose: Nose normal. No congestion or rhinorrhea.     Mouth/Throat:      Mouth: Mucous membranes are moist.     Pharynx: Oropharynx is clear.  Eyes:     Conjunctiva/sclera: Conjunctivae normal.     Comments: Pinpoint pupils bilaterally  Cardiovascular:     Rate and Rhythm: Normal rate and regular rhythm.     Heart sounds: No murmur.  Pulmonary:     Effort: Pulmonary effort is normal. No respiratory distress.     Breath sounds: Normal breath sounds. No wheezing or rales.  Chest:     Chest wall: No tenderness.  Abdominal:     General: Abdomen is flat. There is no distension.     Palpations: Abdomen is soft.  Tenderness: There is no abdominal tenderness. There is no guarding.  Musculoskeletal:        General: No swelling, tenderness or deformity. Normal range of motion.     Cervical back: Normal range of motion and neck supple. No rigidity or tenderness.     Right lower leg: No edema.     Left lower leg: No edema.  Skin:    General: Skin is warm and dry.     Coloration: Skin is not jaundiced.     Findings: No bruising.  Neurological:     General: No focal deficit present.     Mental Status: He is alert and oriented to person, place, and time.     Cranial Nerves: No cranial nerve deficit.     Sensory: No sensory deficit.     Motor: No weakness.     Gait: Gait normal.  Psychiatric:        Mood and Affect: Mood normal.        Behavior: Behavior normal.     ED Results / Procedures / Treatments   Labs (all labs ordered are listed, but only abnormal results are displayed) Labs Reviewed  URINALYSIS, ROUTINE W REFLEX MICROSCOPIC - Abnormal; Notable for the following components:      Result Value   Protein, ur 100 (*)    All other components within normal limits  BASIC METABOLIC PANEL  CBC    EKG EKG Interpretation  Date/Time:  Monday Nov 20 2019 13:07:13 EDT Ventricular Rate:  107 PR Interval:  188 QRS Duration: 100 QT Interval:  352 QTC Calculation: 469 R Axis:   -65 Text Interpretation: Sinus tachycardia with Premature atrial  complexes with Abberant conduction Biatrial enlargement Incomplete right bundle branch block Left anterior fascicular block Minimal voltage criteria for LVH, may be normal variant ( R in aVL ) Cannot rule out Anteroseptal infarct , age undetermined Abnormal ECG deep inverted t waves in anterior leads now resolved Confirmed by Deno Etienne [phone]) on 11/20/2019 4:39:34 PM   Radiology CT HEAD WO CONTRAST  Result Date: 11/20/2019 CLINICAL DATA:  Acute pain due to trauma. EXAM: CT HEAD WITHOUT CONTRAST TECHNIQUE: Contiguous axial images were obtained from the base of the skull through the vertex without intravenous contrast. COMPARISON:  None. FINDINGS: Brain: No evidence of acute infarction, hemorrhage, hydrocephalus, extra-axial collection or mass lesion/mass effect. There is atrophy and chronic microvascular ischemic changes. Vascular: No hyperdense vessel or unexpected calcification. Skull: Normal. Negative for fracture or focal lesion. Sinuses/Orbits: No acute finding. Other: None. IMPRESSION: No acute intracranial abnormality. Electronically Signed   By: Constance Holster M.D.   On: 11/20/2019 17:05    Procedures Procedures (including critical care time)  Medications Ordered in ED Medications  nitroGLYCERIN (NITROSTAT) SL tablet 0.4 mg (0.4 mg Sublingual Given 11/20/19 1802)  amLODipine (NORVASC) tablet 5 mg (5 mg Oral Given 11/20/19 1759)  metoprolol tartrate (LOPRESSOR) tablet 12.5 mg (12.5 mg Oral Given 11/20/19 1759)    ED Course   I have reviewed the triage vital signs and the nursing notes.  Pertinent labs & imaging results that were available during my care of the patient were reviewed by me and considered in my medical decision making (see chart for details).    MDM Rules/Calculators/A&P                      Patient is a 84 year old male who presents after a fall earlier today.  At the time of fall, he was at  the mall walking without a walker or cane.  He slipped on some stairs and fell,  striking his left knee, right arm, and right side of head.   EKG was notable for large P waves.  This is a new finding from prior studies.  Vital signs notable for hypertension.  Patient reports that he has taken his home hypertension medications today.  He denies any current chest pain or shortness of breath.  He does endorse a global headache.  Vision is baseline.  On exam, patient has pinpoint bilateral pupils.  He has no focal neurologic deficits.  No murmurs are appreciated on cardiac auscultation.  His left knee has no associated swelling or external signs of injury.  Active and passive range of motion is intact. Right arm has no areas of swelling, tenderness, or deformity.  No extremity imaging is indicated at this time.  Patient has no external signs of trauma on his head.  CT of head was negative for any acute intracranial abnormalities.  CBC, BMP were unremarkable.  Patient was given home medications (Nitrostat, metoprolol, and Norvasc) for elevated blood pressure.  Patient was monitored in the ED without any further new or worsening symptoms.  He was able to walk to the bathroom with the assistance of a walker.  Patient has no worsening of knee pain with weightbearing.  Given the patient's hearing deficit, his wife was contacted by telephone to obtain any corroborating information.  Given the language barrier, no further history was able to be obtained from his wife.  She was also not a witness to the fall earlier today.  Given history, do not suspect syncopal episode.  Do not suspect new onset seizures.  Given exam and CT of head, do not suspect any acute injuries.  Patient describes a mechanical fall.    Following medications, blood pressure improved.  Patient to follow-up with PCP.  He was discharged in stable condition.  Final Clinical Impression(s) / ED Diagnoses Final diagnoses:  Fall, initial encounter    Rx / DC Orders ED Discharge Orders    None       Godfrey Pick,  MD 11/21/19 [place], [place], DO 11/21/19 1525

## 2019-11-20 NOTE — ED Notes (Signed)
Patient verbalizes understanding of discharge instructions . Opportunity for questions and answers were provided . Armband removed by staff ,Pt discharged from ED. W/C  offered at D/C  and Declined W/C at D/C and was escorted to lobby by RN.  

## 2019-11-20 NOTE — ED Triage Notes (Signed)
Pt bib ems for fall at the mall, pt unsure if he hit his head or LOC. Pt was clammy and tachycardic when first medic arrived. Pt c.o left knee pain and headache. Pupils pinpoint. Pt hypertensive.

## 2019-11-20 NOTE — Discharge Instructions (Signed)
Please return for any worsening concerns. Follow-up with your family doctor.

## 2019-11-20 NOTE — ED Notes (Signed)
Pt ambulated in hallway with walker. Tolerated well.

## 2019-12-13 ENCOUNTER — Encounter: Payer: Self-pay | Admitting: Neurology

## 2020-01-19 ENCOUNTER — Other Ambulatory Visit: Payer: Self-pay

## 2020-01-19 ENCOUNTER — Ambulatory Visit (INDEPENDENT_AMBULATORY_CARE_PROVIDER_SITE_OTHER): Payer: Medicare Other | Admitting: Neurology

## 2020-01-19 ENCOUNTER — Encounter: Payer: Self-pay | Admitting: Neurology

## 2020-01-19 VITALS — BP 170/103 | HR 83 | Ht 68.0 in | Wt 185.2 lb

## 2020-01-19 DIAGNOSIS — R4189 Other symptoms and signs involving cognitive functions and awareness: Secondary | ICD-10-CM

## 2020-01-19 DIAGNOSIS — R030 Elevated blood-pressure reading, without diagnosis of hypertension: Secondary | ICD-10-CM | POA: Diagnosis not present

## 2020-01-19 DIAGNOSIS — S060X0D Concussion without loss of consciousness, subsequent encounter: Secondary | ICD-10-CM
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
## 2020-01-19 NOTE — Patient Instructions (Signed)
I do not appreciate any remaining signs of concussion To further evaluate memory, will order neurocognitive testing Follow up after testing

## 2020-03-05 ENCOUNTER — Encounter: Payer: Medicare Other | Admitting: Psychology

## 2020-03-13 ENCOUNTER — Encounter: Payer: Medicare Other | Admitting: Psychology

## 2020-04-11 ENCOUNTER — Other Ambulatory Visit: Payer: Self-pay | Admitting: Interventional Cardiology

## 2020-04-19 ENCOUNTER — Other Ambulatory Visit: Payer: Self-pay | Admitting: Interventional Cardiology

## 2020-04-26 ENCOUNTER — Other Ambulatory Visit: Payer: Self-pay | Admitting: Interventional Cardiology

## 2020-05-07 ENCOUNTER — Other Ambulatory Visit: Payer: Self-pay | Admitting: Interventional Cardiology

## 2020-05-22 ENCOUNTER — Other Ambulatory Visit: Payer: Self-pay | Admitting: Interventional Cardiology

## 2020-06-05 ENCOUNTER — Other Ambulatory Visit: Payer: Self-pay | Admitting: Interventional Cardiology

## 2020-07-01 ENCOUNTER — Other Ambulatory Visit: Payer: Self-pay | Admitting: Interventional Cardiology

## 2020-07-04 NOTE — Progress Notes (Signed)
Cardiology Office Note   Date:  07/05/2020   ID:  Jeffrey Zimmerman, DOB [birth year], MRN [id number]  PCP:  Gaynelle Arabian, MD    No chief complaint on file.  CAD/Old MI  Wt Readings from Last 3 Encounters:  07/05/20 192 lb 6.4 oz (87.3 kg)  01/19/20 185 lb 3.2 oz (84 kg)  11/20/19 185 lb (83.9 kg)       History of Present Illness: Jeffrey Zimmerman is a 84 y.o. male  With a h/o anterior STEMI in 2011 with several LAD stents placed.  He has had Atrial tachycardia: Noted by monitor in 2013.  He has had issues with leg edema.  He has some knee problem and swelling at the knee joints.  Had trouble gaining weight in the past.   Denies : Chest pain. Dizziness. Nitroglycerin use. Orthopnea. Palpitations. Paroxysmal nocturnal dyspnea. Shortness of breath. Syncope.   No problems when he got his shots.    Past Medical History:  Diagnosis Date  . Adenomatous colon polyp 2/10  . Basal cell carcinoma   . Cancer ([place])    colon  . Coronary artery disease    04/21/10 Anter MI with BMS to LAD with residual proximal LAD EF 40%  . Gout   . History of cholecystectomy   . History of colonoscopy    1/5 AND 2/10  . Hypertension   . Irritable bowel syndrome   . Myocardial infarct ([place])   . SVT (supraventricular tachycardia) ([facility])     Past Surgical History:  Procedure Laterality Date  . APPENDECTOMY    . CARDIAC CATHETERIZATION     04/21/10 Anter MI with BMS to LAD with residual proximal LAD EF 40%  . KNEE ARTHOSCOPY Bilateral   . PARTIAL LEFT NEPHRECTOMY     (for oncocytoma-benign)     Current Outpatient Medications  Medication Sig Dispense Refill  . amLODipine (NORVASC) 5 MG tablet TAKE 1 TABLET BY MOUTH DAILY 90 tablet 3  . aspirin EC 81 MG tablet Take 81 mg by mouth daily.    . clopidogrel (PLAVIX) 75 MG tablet Take 1 tablet (75 mg total) by mouth daily. 90 tablet 3  . HYDROcodone-homatropine (HYCODAN) 5-1.5 MG/5ML syrup Take 5 mLs by mouth every 8 (eight) hours as needed  for cough. 120 mL 0  . lisinopril (ZESTRIL) 10 MG tablet Take 1 tablet (10 mg total) by mouth daily. Please keep upcoming appointment for future refills. Thank you 30 tablet 0  . metoprolol tartrate (LOPRESSOR) 25 MG tablet TAKE 1/2 TABLET BY MOUTH TWICE A DAY 90 tablet 3  . nitroGLYCERIN (NITROSTAT) 0.4 MG SL tablet Place 1 tablet (0.4 mg total) under the tongue every 5 (five) minutes as needed for chest pain. 75 tablet 1  . pravastatin (PRAVACHOL) 20 MG tablet TAKE 1 TABLET (20 MG TOTAL) BY MOUTH DAILY. PLEASE SCHEDULE APPOINTMENT FOR FUTURE REFILLS. 30 tablet 0  . tamsulosin (FLOMAX) 0.4 MG CAPS capsule Take 0.4 mg by mouth daily.    Marland Kitchen triamterene-hydrochlorothiazide (MAXZIDE) 75-50 MG tablet TAKE 1/2 TABLET BY MOUTH EVERY OTHER DAY 90 tablet 1   No current facility-administered medications for this visit.    Allergies:   Codeine    Social History:  The patient  reports that he has never smoked. He has never used smokeless tobacco. He reports that he does not drink alcohol and does not use drugs.   Family History:  The patient's family history includes CAD in his mother; Colon cancer in his  father; Heart attack in his mother; Rheum arthritis in his maternal grandfather.    ROS:  Please see the history of present illness.   Otherwise, review of systems are positive for hard of hearing.   All other systems are reviewed and negative.    PHYSICAL EXAM: VS:  BP (!) 146/80   Pulse 85   Ht 5\' 8"  (1.727 m)   Wt 192 lb 6.4 oz (87.3 kg)   SpO2 94%   BMI 29.25 kg/m  , BMI Body mass index is 29.25 kg/m. GEN: Well nourished, well developed, in no acute distress  HEENT: normal  Neck: no JVD, carotid bruits, or masses Cardiac: RRR; no murmurs, rubs, or gallops,no edema  Respiratory:  clear to auscultation bilaterally, normal work of breathing GI: soft, nontender, nondistended, + BS MS: no deformity or atrophy  Skin: warm and dry, no rash Neuro:  Strength and sensation are intact Psych:  euthymic mood, full affect   EKG:   The ekg ordered today demonstrates sinus tach, poor R wave progression   Recent Labs: 11/20/2019: BUN 12; Creatinine, Ser 1.00; Hemoglobin 16.6; Platelets 283; Potassium 3.6; Sodium 142   Lipid Panel    Component Value Date/Time   CHOL 161 04/10/2016 0951   TRIG 107 04/10/2016 0951   HDL 52 04/10/2016 0951   CHOLHDL 3.1 04/10/2016 0951   VLDL 21 04/10/2016 0951   LDLCALC 88 04/10/2016 0951     Other studies Reviewed: Additional studies/ records that were reviewed today with results demonstrating: labs reviewed; LDL controlled.   ASSESSMENT AND PLAN:  1. CAD: Continue aggressive secondary prevention. Stop aspirin. Restart plavix 75 mg daily.  2. Hyperlipidemia: The current medical regimen is effective;  continue present plan and medications.  3. HTN: Borderline today.  Check at home.  Bring in readings.  Would increase ACE-I if readings are high.  4. Atrial tachycardia: No sx of palpitations.  5. Increase activity safely.  Avoid falls.    Current medicines are reviewed at length with the patient today.  The patient concerns regarding his medicines were addressed.  The following changes have been made:  No change  Labs/ tests ordered today include:  No orders of the defined types were placed in this encounter.   Recommend 150 minutes/week of aerobic exercise Low fat, low carb, high fiber diet recommended  Disposition:   FU in 1 year   Signed, Larae Grooms, MD  07/05/2020 4:35 PM    [facility] [address], [place], [place]  [address] Phone: [phone]; Fax: [phone]

## 2020-07-05 ENCOUNTER — Encounter: Payer: Self-pay | Admitting: Interventional Cardiology

## 2020-07-05 ENCOUNTER — Ambulatory Visit (INDEPENDENT_AMBULATORY_CARE_PROVIDER_SITE_OTHER): Payer: Medicare Other | Admitting: Interventional Cardiology

## 2020-07-05 ENCOUNTER — Other Ambulatory Visit: Payer: Self-pay

## 2020-07-05 VITALS — BP 146/80 | HR 85 | Ht 68.0 in | Wt 192.4 lb

## 2020-07-05 DIAGNOSIS — I252 Old myocardial infarction: Secondary | ICD-10-CM

## 2020-07-05 DIAGNOSIS — I2510 Atherosclerotic heart disease of native coronary artery without angina pectoris: Secondary | ICD-10-CM

## 2020-07-05 DIAGNOSIS — I10 Essential (primary) hypertension: Secondary | ICD-10-CM

## 2020-07-05 DIAGNOSIS — E782 Mixed hyperlipidemia: Secondary | ICD-10-CM | POA: Diagnosis not present

## 2020-07-05 MED ORDER — CLOPIDOGREL BISULFATE 75 MG PO TABS: 75.0000 mg | ORAL_TABLET | Freq: Every day | ORAL | 3 refills | Status: DC

## 2020-07-05 NOTE — Patient Instructions (Signed)
Medication Instructions:  Your physician has recommended you make the following change in your medication: Stop Aspirin.  Resume Clopidogrel 75 mg by mouth daily *If you need a refill on your cardiac medications before your next appointment, please call your pharmacy*   Lab Work: none If you have labs (blood work) drawn today and your tests are completely normal, you will receive your results only by: Marland Kitchen MyChart Message (if you have MyChart) OR . A paper copy in the mail If you have any lab test that is abnormal or we need to change your treatment, we will call you to review the results.   Testing/Procedures: none   Follow-Up: At [facility], you and your health needs are our priority.  As part of our continuing mission to provide you with exceptional heart care, we have created designated Provider Care Teams.  These Care Teams include your primary Cardiologist (physician) and Advanced Practice Providers (APPs -  Physician Assistants and Nurse Practitioners) who all work together to provide you with the care you need, when you need it.  We recommend signing up for the patient portal called "MyChart".  Sign up information is provided on this After Visit Summary.  MyChart is used to connect with patients for Virtual Visits (Telemedicine).  Patients are able to view lab/test results, encounter notes, upcoming appointments, etc.  Non-urgent messages can be sent to your provider as well.   To learn more about what you can do with MyChart, go to [email or web address].    Your next appointment:   One year(s)  The format for your next appointment:   In Person  Provider:   You may see Larae Grooms, MD or one of the following Advanced Practice Providers on your designated Care Team:    Melina Copa, PA-C  Ermalinda Barrios, PA-C    Other Instructions Please check your blood pressure and heart rate at home.  Keep record of readings and bring or call to office in January
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
## 2020-07-25 ENCOUNTER — Ambulatory Visit: Payer: Medicare Other | Admitting: Neurology

## 2020-08-06 ENCOUNTER — Other Ambulatory Visit: Payer: Self-pay | Admitting: Interventional Cardiology

## 2020-08-07 ENCOUNTER — Emergency Department (HOSPITAL_COMMUNITY)
Admission: EM | Admit: 2020-08-07 | Discharge: 2020-08-08 | Disposition: A | Discharge status: Home / Self Care | Payer: Medicare Other | Attending: Emergency Medicine | Admitting: Emergency Medicine

## 2020-08-07 ENCOUNTER — Emergency Department (HOSPITAL_COMMUNITY): Payer: Medicare Other

## 2020-08-07 ENCOUNTER — Other Ambulatory Visit: Payer: Self-pay

## 2020-08-07 ENCOUNTER — Other Ambulatory Visit: Payer: Self-pay | Admitting: Interventional Cardiology

## 2020-08-07 DIAGNOSIS — M25551 Pain in right hip: Secondary | ICD-10-CM | POA: Diagnosis not present

## 2020-08-07 DIAGNOSIS — I2510 Atherosclerotic heart disease of native coronary artery without angina pectoris: Secondary | ICD-10-CM | POA: Diagnosis not present

## 2020-08-07 DIAGNOSIS — I10 Essential (primary) hypertension: Secondary | ICD-10-CM | POA: Diagnosis not present

## 2020-08-07 DIAGNOSIS — Z79899 Other long term (current) drug therapy: Secondary | ICD-10-CM | POA: Diagnosis not present

## 2020-08-07 DIAGNOSIS — W19XXXA Unspecified fall, initial encounter: Secondary | ICD-10-CM | POA: Insufficient documentation

## 2020-08-07 DIAGNOSIS — R000 Tachycardia, unspecified: Secondary | ICD-10-CM | POA: Diagnosis not present

## 2020-08-07 DIAGNOSIS — R52 Pain, unspecified: Secondary | ICD-10-CM | POA: Diagnosis not present

## 2020-08-07 DIAGNOSIS — Z85828 Personal history of other malignant neoplasm of skin: Secondary | ICD-10-CM | POA: Insufficient documentation

## 2020-08-07 DIAGNOSIS — R0902 Hypoxemia: Secondary | ICD-10-CM | POA: Diagnosis not present

## 2020-08-07 DIAGNOSIS — M25561 Pain in right knee: Secondary | ICD-10-CM | POA: Diagnosis not present

## 2020-08-07 DIAGNOSIS — Z85038 Personal history of other malignant neoplasm of large intestine: Secondary | ICD-10-CM | POA: Diagnosis not present

## 2020-08-07 DIAGNOSIS — R7989 Other specified abnormal findings of blood chemistry: Secondary | ICD-10-CM | POA: Insufficient documentation

## 2020-08-07 DIAGNOSIS — Z7902 Long term (current) use of antithrombotics/antiplatelets: Secondary | ICD-10-CM | POA: Diagnosis not present

## 2020-08-07 DIAGNOSIS — Y92009 Unspecified place in unspecified non-institutional (private) residence as the place of occurrence of the external cause: Secondary | ICD-10-CM | POA: Insufficient documentation

## 2020-08-07 DIAGNOSIS — R748 Abnormal levels of other serum enzymes: Secondary | ICD-10-CM | POA: Diagnosis not present

## 2020-08-07 DIAGNOSIS — M25461 Effusion, right knee: Secondary | ICD-10-CM

## 2020-08-07 DIAGNOSIS — M25572 Pain in left ankle and joints of left foot: Secondary | ICD-10-CM | POA: Diagnosis not present

## 2020-08-07 LAB — CBC WITH DIFFERENTIAL/PLATELET
Abs Immature Granulocytes: 0.06 10*3/uL (ref 0.00–0.07)
Basophils Absolute: 0 10*3/uL (ref 0.0–0.1)
Basophils Relative: 0 %
Eosinophils Absolute: 0 10*3/uL (ref 0.0–0.5)
Eosinophils Relative: 0 %
HCT: 44.4 % (ref 39.0–52.0)
Hemoglobin: 14.8 g/dL (ref 13.0–17.0)
Immature Granulocytes: 1 %
Lymphocytes Relative: 4 %
Lymphs Abs: 0.5 10*3/uL — ABNORMAL LOW (ref 0.7–4.0)
MCH: 30 pg (ref 26.0–34.0)
MCHC: 33.3 g/dL (ref 30.0–36.0)
MCV: 90.1 fL (ref 80.0–100.0)
Monocytes Absolute: 0.6 10*3/uL (ref 0.1–1.0)
Monocytes Relative: 5 %
Neutro Abs: 10.2 10*3/uL — ABNORMAL HIGH (ref 1.7–7.7)
Neutrophils Relative %: 90 %
Platelets: 247 10*3/uL (ref 150–400)
RBC: 4.93 MIL/uL (ref 4.22–5.81)
RDW: 13.2 % (ref 11.5–15.5)
WBC: 11.3 10*3/uL — ABNORMAL HIGH (ref 4.0–10.5)
nRBC: 0 % (ref 0.0–0.2)

## 2020-08-07 LAB — COMPREHENSIVE METABOLIC PANEL
ALT: 21 U/L (ref 0–44)
AST: 36 U/L (ref 15–41)
Albumin: 3.3 g/dL — ABNORMAL LOW (ref 3.5–5.0)
Alkaline Phosphatase: 70 U/L (ref 38–126)
Anion gap: 14 (ref 5–15)
BUN: 22 mg/dL (ref 8–23)
CO2: 22 mmol/L (ref 22–32)
Calcium: 8.6 mg/dL — ABNORMAL LOW (ref 8.9–10.3)
Chloride: 106 mmol/L (ref 98–111)
Creatinine, Ser: 0.89 mg/dL (ref 0.61–1.24)
GFR, Estimated: >60 mL/min (ref >60)
Glucose, Bld: 107 mg/dL — ABNORMAL HIGH (ref 70–99)
Potassium: 3.5 mmol/L (ref 3.5–5.1)
Sodium: 142 mmol/L (ref 135–145)
Total Bilirubin: 2.2 mg/dL — ABNORMAL HIGH (ref 0.3–1.2)
Total Protein: 5.9 g/dL — ABNORMAL LOW (ref 6.5–8.1)

## 2020-08-07 LAB — URINALYSIS, ROUTINE W REFLEX MICROSCOPIC
Bilirubin Urine: NEGATIVE
Glucose, UA: NEGATIVE mg/dL
Ketones, ur: 80 mg/dL — AB
Leukocytes,Ua: NEGATIVE
Nitrite: NEGATIVE
Protein, ur: 100 mg/dL — AB
Specific Gravity, Urine: 1.023 (ref 1.005–1.030)
pH: 5 (ref 5.0–8.0)

## 2020-08-07 LAB — CK: Total CK: 1109 U/L — ABNORMAL HIGH (ref 49–397)

## 2020-08-07 LAB — TYPE AND SCREEN
ABO/RH(D): O POS
Antibody Screen: NEGATIVE

## 2020-08-07 LAB — PROTIME-INR
INR: 1.1 (ref 0.8–1.2)
Prothrombin Time: 14.2 seconds (ref 11.4–15.2)

## 2020-08-07 MED ORDER — ONDANSETRON HCL 4 MG/2ML IJ SOLN: 4.0000 mg | Freq: Once | INTRAMUSCULAR | Status: DC

## 2020-08-07 MED ORDER — SODIUM CHLORIDE 0.9 % IV BOLUS
1000.0000 mL | Freq: Once | INTRAVENOUS | Status: AC
  Administered 2020-08-07: 1000 mL via INTRAVENOUS

## 2020-08-07 MED ORDER — FENTANYL CITRATE (PF) 100 MCG/2ML IJ SOLN: 50.0000 ug | INTRAMUSCULAR | Status: DC | PRN

## 2020-08-07 NOTE — Care Management (Signed)
ED RNCM called all of the phone numbers in his record and no answer and was unable to reach anyone.

## 2020-08-07 NOTE — ED Notes (Signed)
This RN spoke with social work on the phone, inquiring about ride home for pt. Social work told this RN that they will call back with an answer shortly

## 2020-08-07 NOTE — ED Provider Notes (Signed)
Care of the patient was assumed from Leander PA-C at 300 see this provider's note for complete history of present illness, review of systems, and physical exam.  Briefly, the patient is a 85 y.o. male who presented to the ED with fall with significant right hip pain.  History significant for coronary disease, colon cancer.  Fall did occur around 7 AM and patient did lay on the floor for about 7 hours.  Mechanical fall, did not hit his head, no LOC. Pt is on Plavix.   Plan at time of handoff:  Follow up on xrays.   Physical Exam  BP (!) 163/83 (BP Location: Right Arm)   Pulse (!) 116   Resp 14   SpO2 93%   Physical Exam Constitutional:      General: He is not in acute distress.    Appearance: Normal appearance. He is not ill-appearing, toxic-appearing or diaphoretic.  HENT:     Head: Normocephalic and atraumatic.  Eyes:     Extraocular Movements: Extraocular movements intact.     Pupils: Pupils are equal, round, and reactive to light.  Cardiovascular:     Rate and Rhythm: Normal rate and regular rhythm.     Pulses: Normal pulses.  Pulmonary:     Effort: Pulmonary effort is normal.     Breath sounds: Normal breath sounds.  Musculoskeletal:        General: Normal range of motion.     Cervical back: Normal range of motion.     Comments: Right hip without any tenderness, no inflammation or erythema.  No bony tenderness.  Patient is able to lift leg.  No warmth or erythema.  Patient with significant swelling to right knee when compared to left.  Patient is able to range knee, and is tender to palpation.  No warmth or redness felt.  Does appear to have effusion.  Is able to raise leg.  Compartments are soft.  PT pulses are 2+.  Patient is able to walk normally with walker.  Back with no cervical, thoracic or lumbar spine tenderness to midline or paraspinal muscles.  Able to sit up from laying position.  Skin:    General: Skin is warm and dry.     Capillary Refill: Capillary refill  takes less than 2 seconds.  Neurological:     General: No focal deficit present.     Mental Status: He is alert and oriented to person, place, and time.     Cranial Nerves: No cranial nerve deficit.     Sensory: No sensory deficit.  Psychiatric:        Mood and Affect: Mood normal.        Behavior: Behavior normal.        Thought Content: Thought content normal.     ED Course/Procedures   Clinical Course as of 08/07/20 1650  Wed Jan 19, 102  [id number] 85 year old male here after fall complaining of right knee pain.  He is swollen tender although not red.  X-ray showing degenerative changes.  CK elevated.  Will need some IV fluids.  Likely can return home if he can ambulate safely. [MB]    Clinical Course User Index [MB] Hayden Rasmussen, MD    Procedures  MDM  85 year old male coming into the emergency department today for fall.  Previous provider stated that patient originally came for evaluation of right hip pain.  When I evaluated patient, patient states that he is not having any pain, states that he fell because  his knee was bothering him.  Patient states that his knee has been bothering him for a long time, has been swollen before.  Denies hitting his head, no LOC.  When I asked patient where he is having pain currently he states that he is not.  He did receive 50 mics of fentanyl by EMS about 2-1/2 hours ago.  Patient states that he normally lives at home alone, uses walker occasionally.  Previous provider that there was questionably mild external rotation to the right lower extremity compared to the left, patient had both legs flattened when I saw patient.  No tenderness to palpation of hip, back.  There is tenderness to palpation of the knee with mild effusion present.  Does not appear septic in nature, not red or warm.  Do not think this is gout.  I think this is most likely from arthritis.  Plain films do show degenerative changes of the knee with mild effusion.  Plain films of hip  are negative.  Work-up today shows CBC with slight elevation of 11.3, CMP unremarkable.  CK is elevated today 1109.  Will give fluids and reassess.  If patient is able to walk then patient can be safely discharged.  Urinalysis does show ketones and hemoglobin with protein, most likely from patient laying on floor for 7 hours, creatinine is normal.  Patient agreeable with this plan, he does want to go home.  Upon reevaluation, patient states that he feels much better. Patient does actually live with wife, who takes care of him.  Patient is tachycardic to 116, will give another bag of IV fluids and reevaluate.  Nursing was able to walk patient who was able to walk to the bathroom with no problems.  Upon reassessment, patient states that he is ready to go home, pulses gone down to 105, did see it go down to 103. Denies pain, has been observed for 7 hours.  Do not think that patient is in florid rhabdomyolysis, patient CK is 1000.  Patient has been treated appropriatey 2 L of fluid, is able to ambulate.  Patient be discharged.  We will follow-up with PCP in regards to repeat labs.  Patient expressed understanding.  Doubt need for further emergent work up at this time. I explained the diagnosis and have given explicit precautions to return to the ER including for any other new or worsening symptoms. The patient understands and accepts the medical plan as it's been dictated and I have answered their questions. Discharge instructions concerning home care and prescriptions have been given. The patient is STABLE and is discharged to home in good condition.  I discussed this case with my attending physician who cosigned this note including patient's presenting symptoms, physical exam, and planned diagnostics and interventions. Attending physician stated agreement with plan or made changes to plan which were implemented. Dr. Melina Copa did also evaluate all labs.   Attending physician assessed patient at  bedside.      Alfredia Client, PA-C 08/07/20 2142    Hayden Rasmussen, MD 08/08/20 [phone]

## 2020-08-07 NOTE — ED Notes (Signed)
This RN attempted to call pt's wife (primary/only contact) three times. No answer & voicemail box is not set up.

## 2020-08-07 NOTE — Care Management (Signed)
ED RNCM received call from [facility] wellness check  no one at home, but will attempt to locate wife again.  Updated Camera operator.

## 2020-08-07 NOTE — ED Provider Notes (Signed)
[facility] EMERGENCY DEPARTMENT Provider Note   CSN: [id number] Arrival date & time: 08/07/20  1426     History Chief Complaint  Patient presents with  . Hip Pain    Jeffrey Zimmerman is a 85 y.o. male.  OnPatient with history of coronary artery disease, colon cancer -- presents to the emergency department today for evaluation of right hip pain. Patient presents after a fall occurring approximately 7 AM today. Patient laid on the floor for several hours after this occurred. He states that he fell because of swelling in his knee and his leg gave out. He denies hitting his head and states that he fell onto his back. No headache or neck pain. He has pain in the right hip and buttocks area. EMS was called for transport. They administered 50 mcg of fentanyl. Patient denies chest pain or shortness of breath. No nausea or vomiting. Pain is worse with movement. No recent illness.         Past Medical History:  Diagnosis Date  . Adenomatous colon polyp 2/10  . Basal cell carcinoma   . Cancer ([place])    colon  . Coronary artery disease    04/21/10 Anter MI with BMS to LAD with residual proximal LAD EF 40%  . Gout   . History of cholecystectomy   . History of colonoscopy    1/5 AND 2/10  . Hypertension   . Irritable bowel syndrome   . Myocardial infarct ([place])   . SVT (supraventricular tachycardia) [facility])     Patient Active Problem List   Diagnosis Date Noted  . Bilateral lower extremity edema 04/10/2016  . Old myocardial infarction 08/10/2013  . Mixed hyperlipidemia 08/10/2013  . Other malaise and fatigue 08/08/2013  . Hypertension   . Coronary artery disease   . Cancer ([place])   . Basal cell carcinoma   . Gout   . Irritable bowel syndrome   . SVT (supraventricular tachycardia) (HCC)     Past Surgical History:  Procedure Laterality Date  . APPENDECTOMY    . CARDIAC CATHETERIZATION     04/21/10 Anter MI with BMS to LAD with residual proximal LAD EF 40%  .  KNEE ARTHOSCOPY Bilateral   . PARTIAL LEFT NEPHRECTOMY     (for oncocytoma-benign)       Family History  Problem Relation Age of Onset  . Heart attack Mother   . CAD Mother   . Colon cancer Father   . Rheum arthritis Maternal Grandfather     Social History   Tobacco Use  . Smoking status: Never Smoker  . Smokeless tobacco: Never Used  Substance Use Topics  . Alcohol use: No  . Drug use: No    Home Medications Prior to Admission medications   Medication Sig Start Date End Date Taking? Authorizing Provider  amLODipine (NORVASC) 5 MG tablet TAKE 1 TABLET BY MOUTH DAILY 03/28/19   [name], MD  clopidogrel (PLAVIX) 75 MG tablet Take 1 tablet (75 mg total) by mouth daily. 07/05/20   [name], MD  HYDROcodone-homatropine [facility]) 5-1.5 MG/5ML syrup Take 5 mLs by mouth every 8 (eight) hours as needed for cough. 07/27/16   Antonietta Breach, PA-C  lisinopril (ZESTRIL) 10 MG tablet TAKE 1 TABLET BY MOUTH DAILY. PLEASE KEEP UPCOMING APPOINTMENT FOR FUTURE REFILLS. [place] YOU 08/06/20   [name], MD  metoprolol tartrate (LOPRESSOR) 25 MG tablet TAKE 1/2 TABLET BY MOUTH TWICE A DAY 07/24/19   [name],  MD  nitroGLYCERIN (NITROSTAT) 0.4 MG SL tablet Place 1 tablet (0.4 mg total) under the tongue every 5 (five) minutes as needed for chest pain. 05/13/16   [name], MD  pravastatin (PRAVACHOL) 20 MG tablet TAKE 1 TABLET (20 MG TOTAL) BY MOUTH DAILY. PLEASE SCHEDULE APPOINTMENT FOR FUTURE REFILLS. 08/07/20   [name], MD  tamsulosin (FLOMAX) 0.4 MG CAPS capsule Take 0.4 mg by mouth daily. 11/19/19   [provider]  triamterene-hydrochlorothiazide (MAXZIDE) 75-50 MG tablet TAKE 1/2 TABLET BY MOUTH EVERY OTHER DAY 10/08/16   [name], MD    Allergies    Codeine  Review of Systems   Review of Systems  Constitutional: Negative for fever.  HENT: Negative for rhinorrhea and sore throat.   Eyes: Negative for redness.   Respiratory: Negative for cough and shortness of breath.   Cardiovascular: Negative for chest pain.  Gastrointestinal: Negative for abdominal pain, diarrhea, nausea and vomiting.  Genitourinary: Negative for dysuria and hematuria.  Musculoskeletal: Positive for arthralgias, back pain, joint swelling and myalgias.  Skin: Negative for rash.  Neurological: Negative for headaches.    Physical Exam Updated Vital Signs BP (!) 163/83 (BP Location: Right Arm)   Pulse (!) 116   Resp 14   SpO2 93%   Physical Exam Vitals and nursing note reviewed.  Constitutional:      Appearance: He is well-developed and well-nourished.  HENT:     Head: Normocephalic and atraumatic.     Comments: No obvious signs of head trauma such as abrasions, lacerations or contusions.    Right Ear: External ear normal.     Left Ear: External ear normal.     Nose: Nose normal.     Comments: No epistaxis Eyes:     General:        Right eye: No discharge.        Left eye: No discharge.     Conjunctiva/sclera: Conjunctivae normal.  Cardiovascular:     Rate and Rhythm: Regular rhythm. Tachycardia present.     Heart sounds: Normal heart sounds.  Pulmonary:     Effort: Pulmonary effort is normal.     Breath sounds: Normal breath sounds.  Abdominal:     Palpations: Abdomen is soft.     Tenderness: There is no abdominal tenderness.  Musculoskeletal:     Cervical back: Normal range of motion and neck supple. No tenderness or bony tenderness.     Lumbar back: No tenderness or bony tenderness.     Right hip: Tenderness present. Decreased range of motion.     Right upper leg: No tenderness or bony tenderness.     Right knee: Effusion present. No tenderness.     Comments: Question mild external rotation of right lower extremity compared to the left.   Skin:    General: Skin is warm and dry.  Neurological:     Mental Status: He is alert.  Psychiatric:        Mood and Affect: Mood and affect normal.     ED  Results / Procedures / Treatments   Labs (all labs ordered are listed, but only abnormal results are displayed) Labs Reviewed  CBC WITH DIFFERENTIAL/PLATELET  PROTIME-INR  CK  COMPREHENSIVE METABOLIC PANEL  URINALYSIS, ROUTINE W REFLEX MICROSCOPIC  TYPE AND SCREEN    EKG None  Radiology DG Chest 1 View  Result Date: 08/07/2020 CLINICAL DATA:  Recent fall with right hip pain, initial encounter EXAM: CHEST  1 VIEW COMPARISON:  07/27/2016 FINDINGS: Cardiac shadow is mildly enlarged. Elevation of the right hemidiaphragm is noted. No focal infiltrate or effusion is seen. No sizable pneumothorax is noted. Degenerative change of the thoracic spine is noted. No acute bony abnormality is seen. IMPRESSION: No acute abnormality noted. Electronically Signed   By: Inez Catalina M.D.   On: 08/07/2020 15:02   DG Knee Complete 4 Views Right  Result Date: 08/07/2020 CLINICAL DATA:  Recent fall with right knee pain, initial encounter EXAM: RIGHT KNEE - COMPLETE 4+ VIEW COMPARISON:  None. FINDINGS: Moderate joint effusion is noted. Tricompartmental degenerative changes are noted. No acute fracture or dislocation is seen. No other soft tissue abnormality is noted. IMPRESSION: Degenerative change with joint effusion. No acute bony abnormality seen. Electronically Signed   By: Inez Catalina M.D.   On: 08/07/2020 15:05   DG Hip Unilat With Pelvis 2-3 Views Right  Result Date: 08/07/2020 CLINICAL DATA:  Recent fall with right hip pain, initial encounter EXAM: DG HIP (WITH OR WITHOUT PELVIS) 3V RIGHT COMPARISON:  None. FINDINGS: Proximal femur is within normal limits. Pelvic ring as visualized is intact. Mild degenerative changes of the hip joint are seen. IMPRESSION: No definitive fracture is noted. Electronically Signed   By: Inez Catalina M.D.   On: 08/07/2020 15:04    Procedures Procedures (including critical care time)  Medications Ordered in ED Medications  fentaNYL (SUBLIMAZE) injection 50 mcg (has no  administration in time range)  ondansetron (ZOFRAN) injection 4 mg (has no administration in time range)    ED Course  I have reviewed the triage vital signs and the nursing notes.  Pertinent labs & imaging results that were available during my care of the patient were reviewed by me and considered in my medical decision making (see chart for details).  Patient seen and examined. Work-up initiated. Medications ordered. Imaging ordered.   Vital signs reviewed and are as follows: BP (!) 163/83 (BP Location: Right Arm)   Pulse (!) 116   Resp 14   SpO2 93%   3:15 PM X-rays reviewed --no acute fracture.  I evaluated the patient again with RN.  We rolled him.  Patient did not have any pain exhibited on palpation of the posterior hip or lower back.  When I range his leg, he seems to have pain more so in his knee, less so in the groin area.  Will await lab work.  Pt states he sometimes walks with a cane or walker, but seldomly.   Signout to [employer] at shift change.     MDM Rules/Calculators/A&P                          Pending completion of work-up.   Final Clinical Impression(s) / ED Diagnoses Final diagnoses:  Fall, initial encounter  Effusion of right knee  Right hip pain    Rx / DC Orders ED Discharge Orders    None       Carlisle Cater, PA-C 08/07/20 [place], DO 08/07/20 1521

## 2020-08-07 NOTE — Discharge Instructions (Addendum)
You are seen today for a fall, please use the attached guidelines.  Your x-rays today did not show any fracture.  There was a moderate effusion of your right knee, I do want you to follow-up with your primary care for this, please schedule an appointment as soon as possible.  I want you to also stay hydrated, your CK was elevated today most likely due to your prolonged time on the floor.  It is very important that you drink plenty of fluids when you get home.  In which you to have your kidney function and urinalysis and bilirubin to get  rechecked by your PCP in the next 2 days.  Take it easy for the next couple of days, if you have any new or worsening concerning symptoms please come back to the emergency department.  Including if your knee becomes hot or red, you have trouble walking, you have numbness or tingling or you fall again. Take Tylenol as directed on the bottle for pain.

## 2020-08-07 NOTE — Care Management (Signed)
ED  RNCM spoke with patient in [place], he states he is concerned because he does not have a key to enter his home and wife is aparently not home.  ED CM called [employer] to perform a wellness check.  Updated [place] nurse.

## 2020-08-07 NOTE — ED Notes (Signed)
E-signature pad unavailable at time of pt discharge. This RN discussed discharge materials with pt and answered all pt questions. Pt stated understanding of discharge material. ? ?

## 2020-08-07 NOTE — ED Triage Notes (Signed)
Pt from home via EMS for eval of R hip/buttock pain after a fall. Pt laid on floor for eight hours prior to his being found by wife. 50 mcg Fentanyl given PTA.

## 2020-08-08 NOTE — ED Notes (Signed)
Please call son Lennart Pall @ [phone] for a status update

## 2020-08-08 NOTE — Progress Notes (Addendum)
11:32am-CSW received call back from pt's son, Jeffrey Zimmerman. Son reported that he would send pts' wife to pick pt up shortly. CSW updated RN of this .    CSW updated by RN that pt is ready to discharge at this time however unable to get into home. CSW followed up with pt's son Jeffrey Zimmerman (listed in note) to advise him of this. CSW left voicemail asking for call back at this time.     [name], MSW, LCSW [facility] [phone]

## 2020-08-08 NOTE — ED Notes (Signed)
Breakfast tray ordered 

## 2020-08-08 NOTE — ED Notes (Signed)
Jeffrey Herter- son wants call with update [phone]

## 2020-08-08 NOTE — ED Notes (Signed)
Pt d/c home per MD order. Pt wife here for discharge ride home.  Discharge summary reviewed with pt and pt wife, also reviewed with pt son via phone. Verbalize understanding. Off unit via WC.

## 2020-08-08 NOTE — ED Notes (Signed)
This nurse reached out to case management r/t POC.

## 2020-08-12 ENCOUNTER — Other Ambulatory Visit (HOSPITAL_COMMUNITY): Payer: Self-pay | Admitting: Physician Assistant

## 2020-08-12 DIAGNOSIS — W19XXXA Unspecified fall, initial encounter: Secondary | ICD-10-CM | POA: Diagnosis not present

## 2020-08-12 DIAGNOSIS — M7989 Other specified soft tissue disorders: Secondary | ICD-10-CM

## 2020-08-13 ENCOUNTER — Other Ambulatory Visit: Payer: Self-pay

## 2020-08-13 ENCOUNTER — Ambulatory Visit (HOSPITAL_COMMUNITY)
Admission: RE | Admit: 2020-08-13 | Discharge: 2020-08-13 | Disposition: A | Discharge status: Home / Self Care | Payer: Medicare Other | Source: Ambulatory Visit | Attending: Physician Assistant | Admitting: Physician Assistant

## 2020-08-13 DIAGNOSIS — M7989 Other specified soft tissue disorders: Secondary | ICD-10-CM | POA: Diagnosis not present

## 2020-08-20 ENCOUNTER — Other Ambulatory Visit: Payer: Self-pay | Admitting: Interventional Cardiology

## 2020-09-25 ENCOUNTER — Encounter (HOSPITAL_COMMUNITY): Payer: Self-pay

## 2020-09-25 ENCOUNTER — Emergency Department (HOSPITAL_COMMUNITY): Payer: Medicare Other

## 2020-09-25 ENCOUNTER — Emergency Department (HOSPITAL_COMMUNITY)
Admission: EM | Admit: 2020-09-25 | Discharge: 2020-09-25 | Disposition: A | Discharge status: Home / Self Care | Payer: Medicare Other | Attending: Emergency Medicine | Admitting: Emergency Medicine

## 2020-09-25 ENCOUNTER — Other Ambulatory Visit: Payer: Self-pay

## 2020-09-25 DIAGNOSIS — I2510 Atherosclerotic heart disease of native coronary artery without angina pectoris: Secondary | ICD-10-CM | POA: Insufficient documentation

## 2020-09-25 DIAGNOSIS — M79642 Pain in left hand: Secondary | ICD-10-CM | POA: Diagnosis not present

## 2020-09-25 DIAGNOSIS — R0902 Hypoxemia: Secondary | ICD-10-CM | POA: Diagnosis not present

## 2020-09-25 DIAGNOSIS — M7989 Other specified soft tissue disorders: Secondary | ICD-10-CM | POA: Diagnosis not present

## 2020-09-25 DIAGNOSIS — Z79899 Other long term (current) drug therapy: Secondary | ICD-10-CM | POA: Insufficient documentation

## 2020-09-25 DIAGNOSIS — Z85828 Personal history of other malignant neoplasm of skin: Secondary | ICD-10-CM | POA: Diagnosis not present

## 2020-09-25 DIAGNOSIS — I10 Essential (primary) hypertension: Secondary | ICD-10-CM | POA: Diagnosis not present

## 2020-09-25 DIAGNOSIS — R609 Edema, unspecified: Secondary | ICD-10-CM | POA: Diagnosis not present

## 2020-09-25 DIAGNOSIS — M19042 Primary osteoarthritis, left hand: Secondary | ICD-10-CM | POA: Diagnosis not present

## 2020-09-25 LAB — BASIC METABOLIC PANEL
Anion gap: 13 (ref 5–15)
BUN: 10 mg/dL (ref 8–23)
CO2: 22 mmol/L (ref 22–32)
Calcium: 9.2 mg/dL (ref 8.9–10.3)
Chloride: 104 mmol/L (ref 98–111)
Creatinine, Ser: 0.9 mg/dL (ref 0.61–1.24)
GFR, Estimated: >60 mL/min (ref >60)
Glucose, Bld: 107 mg/dL — ABNORMAL HIGH (ref 70–99)
Potassium: 3.7 mmol/L (ref 3.5–5.1)
Sodium: 139 mmol/L (ref 135–145)

## 2020-09-25 LAB — CBC WITH DIFFERENTIAL/PLATELET
Abs Immature Granulocytes: 0.03 10*3/uL (ref 0.00–0.07)
Basophils Absolute: 0 10*3/uL (ref 0.0–0.1)
Basophils Relative: 0 %
Eosinophils Absolute: 0 10*3/uL (ref 0.0–0.5)
Eosinophils Relative: 0 %
HCT: 45.1 % (ref 39.0–52.0)
Hemoglobin: 15.2 g/dL (ref 13.0–17.0)
Immature Granulocytes: 0 %
Lymphocytes Relative: 11 %
Lymphs Abs: 0.9 10*3/uL (ref 0.7–4.0)
MCH: 30.3 pg (ref 26.0–34.0)
MCHC: 33.7 g/dL (ref 30.0–36.0)
MCV: 90 fL (ref 80.0–100.0)
Monocytes Absolute: 0.6 10*3/uL (ref 0.1–1.0)
Monocytes Relative: 7 %
Neutro Abs: 6.6 10*3/uL (ref 1.7–7.7)
Neutrophils Relative %: 82 %
Platelets: 176 10*3/uL (ref 150–400)
RBC: 5.01 MIL/uL (ref 4.22–5.81)
RDW: 14.1 % (ref 11.5–15.5)
WBC: 8.1 10*3/uL (ref 4.0–10.5)
nRBC: 0 % (ref 0.0–0.2)

## 2020-09-25 MED ORDER — PREDNISONE 20 MG PO TABS
20.0000 mg | ORAL_TABLET | Freq: Once | ORAL | Status: AC
  Administered 2020-09-25: 20 mg via ORAL
  Filled 2020-09-25: qty 1

## 2020-09-25 MED ORDER — IBUPROFEN 400 MG PO TABS: 400.0000 mg | ORAL_TABLET | Freq: Three times a day (TID) | ORAL | 0 refills | Status: AC | PRN

## 2020-09-25 MED ORDER — IBUPROFEN 400 MG PO TABS
400.0000 mg | ORAL_TABLET | Freq: Once | ORAL | Status: AC
  Administered 2020-09-25: 400 mg via ORAL
  Filled 2020-09-25: qty 1

## 2020-09-25 MED ORDER — PREDNISONE 10 MG PO TABS: 20.0000 mg | ORAL_TABLET | Freq: Every day | ORAL | 0 refills | Status: AC

## 2020-09-25 NOTE — Discharge Instructions (Addendum)
Please follow-up with your primary care doctor.  Take 400 mg of ibuprofen every 8 hours Take prednisone as prescribed   Please follow up with your primary care doctor in the next week.

## 2020-09-25 NOTE — ED Triage Notes (Signed)
Pt BIB ems from home; called out for L arm pain and swelling x 2 days, worsening; denies trauma, denies injury; no relief with meds; hx of same, resulting in hospital admission x 2 weeks, could not remember diagnosis; pt has not other complaints; pain worsens with movement  168/86 HR 92 92, 93% RA RR 18

## 2020-09-25 NOTE — ED Notes (Signed)
Patient verbalizes understanding of discharge instructions. Opportunity for questioning and answers were provided. Pt placed in progressive bed to wait for wife to pick up. Food and beverage given.

## 2020-09-25 NOTE — ED Notes (Signed)
Spoke with pt's wife to arrange for ride home; wife at work, will pick up pt when she gets off between 1430 and 1500

## 2020-09-25 NOTE — ED Provider Notes (Signed)
[facility] EMERGENCY DEPARTMENT Provider Note   CSN: [id number] Arrival date & time: 09/25/20  1003     History Chief Complaint  Patient presents with  . Arm Pain    Jeffrey Zimmerman is a 85 y.o. male.  HPI Jeffrey Zimmerman is an 85 year old male with CAD, HTN, IBS and history of basal cell carcinoma and colon cancer, memory issues, chronic lower extremity edema, and chronic balance issues who presents for left hand pain.  He states that it is hurting primarily over the first and second digit of his left hand and the MCP he states that it is painful, swollen, states that it has been worsening over the past 2 days.  He states that he does not recall any trauma to the area he has no memory of having an issue with gout however he states that he has had flareups similar to this in the past.  He does not recall what the condition was.    He states that he does have memory issues at baseline but that he cannot member the issue beginning approximately 2 days ago.  He denies any fevers, chills, cough, congestion, lightheadedness or dizziness.  No other associated symptoms.  I attempted to discuss with patient's wife however she speaks vietnamese      Past Medical History:  Diagnosis Date  . Adenomatous colon polyp 2/10  . Basal cell carcinoma   . Cancer ([place])    colon  . Coronary artery disease    04/21/10 Anter MI with BMS to LAD with residual proximal LAD EF 40%  . Gout   . History of cholecystectomy   . History of colonoscopy    1/5 AND 2/10  . Hypertension   . Irritable bowel syndrome   . Myocardial infarct ([place])   . SVT (supraventricular tachycardia) [facility])     Patient Active Problem List   Diagnosis Date Noted  . Bilateral lower extremity edema 04/10/2016  . Old myocardial infarction 08/10/2013  . Mixed hyperlipidemia 08/10/2013  . Other malaise and fatigue 08/08/2013  . Hypertension   . Coronary artery disease   . Cancer ([place])   . Basal cell carcinoma    . Gout   . Irritable bowel syndrome   . SVT (supraventricular tachycardia) ([facility])     Past Surgical History:  Procedure Laterality Date  . APPENDECTOMY    . CARDIAC CATHETERIZATION     04/21/10 Anter MI with BMS to LAD with residual proximal LAD EF 40%  . KNEE ARTHOSCOPY Bilateral   . PARTIAL LEFT NEPHRECTOMY     (for oncocytoma-benign)       Family History  Problem Relation Age of Onset  . Heart attack Mother   . CAD Mother   . Colon cancer Father   . Rheum arthritis Maternal Grandfather     Social History   Tobacco Use  . Smoking status: Never Smoker  . Smokeless tobacco: Never Used  Substance Use Topics  . Alcohol use: No  . Drug use: No    Home Medications Prior to Admission medications   Medication Sig Start Date End Date Taking? Authorizing Provider  ibuprofen (ADVIL) 400 MG tablet Take 1 tablet (400 mg total) by mouth every 8 (eight) hours as needed for up to 5 days. 09/25/20 09/30/20 Yes ,  S, PA  predniSONE (DELTASONE) 10 MG tablet Take 2 tablets (20 mg total) by mouth daily for 3 days. 09/25/20 09/28/20 Yes , Ova [place] S, PA  amLODipine (  NORVASC) 5 MG tablet TAKE 1 TABLET BY MOUTH DAILY 03/28/19   [name], MD  clopidogrel (PLAVIX) 75 MG tablet Take 1 tablet (75 mg total) by mouth daily. 07/05/20   [name], MD  HYDROcodone-homatropine [facility]) 5-1.5 MG/5ML syrup Take 5 mLs by mouth every 8 (eight) hours as needed for cough. 07/27/16   Antonietta Breach, PA-C  lisinopril (ZESTRIL) 10 MG tablet Take 1 tablet (10 mg total) by mouth daily. 08/20/20   [name], MD  metoprolol tartrate (LOPRESSOR) 25 MG tablet TAKE 1/2 TABLET BY MOUTH TWICE A DAY 07/24/19   [name], MD  nitroGLYCERIN (NITROSTAT) 0.4 MG SL tablet Place 1 tablet (0.4 mg total) under the tongue every 5 (five) minutes as needed for chest pain. 05/13/16   [name], MD  pravastatin (PRAVACHOL) 20 MG tablet TAKE 1 TABLET (20 MG TOTAL) BY MOUTH  DAILY. PLEASE SCHEDULE APPOINTMENT FOR FUTURE REFILLS. 08/07/20   [name], MD  tamsulosin (FLOMAX) 0.4 MG CAPS capsule Take 0.4 mg by mouth daily. 11/19/19   [provider]  triamterene-hydrochlorothiazide (MAXZIDE) 75-50 MG tablet TAKE 1/2 TABLET BY MOUTH EVERY OTHER DAY 10/08/16   [name], MD    Allergies    Codeine  Review of Systems   Review of Systems  Constitutional: Negative for chills, fatigue and fever.       Chronic memory issues   HENT: Negative for congestion.   Respiratory: Negative for shortness of breath.   Cardiovascular: Negative for chest pain.  Gastrointestinal: Negative for abdominal pain.  Musculoskeletal: Positive for arthralgias (left hand 1st/2nd MCP). Negative for neck pain.    Physical Exam Updated Vital Signs BP (!) 166/92   Pulse 87   Temp 98.5 F (36.9 C) (Oral)   Resp (!) 21   Ht 5\' 7"  (1.702 m)   Wt 84.4 kg   SpO2 96%   BMI 29.13 kg/m   Physical Exam Vitals and nursing note reviewed.  Constitutional:      General: He is not in acute distress.    Appearance: Normal appearance. He is not ill-appearing.  HENT:     Head: Normocephalic and atraumatic.  Eyes:     General: No scleral icterus.       Right eye: No discharge.        Left eye: No discharge.     Conjunctiva/sclera: Conjunctivae normal.  Pulmonary:     Effort: Pulmonary effort is normal.     Breath sounds: No stridor.  Musculoskeletal:     Comments: Severe pain with even light touch of the affected joints.  There is notable swelling around these two joints. Unable to range the joints significantly 2/2 pain. But approximately 10 degrees of movement tolerated by pt  Skin:    Comments: Faint redness over the MCP joint of the first and second digits left hand  Neurological:     Mental Status: He is alert and oriented to person, place, and time. Mental status is at baseline.     ED Results / Procedures / Treatments   Labs (all labs ordered are  listed, but only abnormal results are displayed) Labs Reviewed  BASIC METABOLIC PANEL - Abnormal; Notable for the following components:      Result Value   Glucose, Bld 107 (*)    All other components within normal limits  CBC WITH DIFFERENTIAL/PLATELET    EKG None  Radiology DG Hand Complete Left  Result Date: 09/25/2020 CLINICAL DATA:  Swelling the first and second MCP joints. EXAM: LEFT HAND - COMPLETE 3+ VIEW COMPARISON:  Left forearm radiographs 12/31/2011 FINDINGS: Progressive advanced degenerative changes are present at the first [facility] joint with radial subluxation. Adjacent soft tissue swelling is present. No acute osseous abnormality is present. The hand is otherwise unremarkable. IMPRESSION: 1. Progressive advanced degenerative changes at the first [facility] joint with radial subluxation. 2. No acute osseous abnormality. Electronically Signed   By: San Morelle M.D.   On: 09/25/2020 11:07    Procedures Procedures   Medications Ordered in ED Medications  ibuprofen (ADVIL) tablet 400 mg (has no administration in time range)  predniSONE (DELTASONE) tablet 20 mg (has no administration in time range)    ED Course  I have reviewed the triage vital signs and the nursing notes.  Pertinent labs & imaging results that were available during my care of the patient were reviewed by me and considered in my medical decision making (see chart for details).    MDM Rules/Calculators/A&P                          Patient is an 85 year old male with past medical history discussed in HPI presented today for left hand pain specifically over the first and second MCP joints.  Patient does have some swelling and tenderness of these 2 joints and has some decreased range of motion due to pain.  No overlying cellulitis evident.  Broad differential was considered including disseminated gonococcal disease, disseminated bacterial infection, rheumatoid arthritis, inflammatory arthritis, gout  Does  have a poor memory.  But he states he did not fall.  X-ray reviewed and does not show any fractures.  No significant abnormalities. Patient does have progressive advanced degenerative changes of the first [facility] joint which areas where patient is having pain.  My attending physician assessed patient at bedside and agrees to plan which is to begin patient on ibuprofen 400 mg every 8 hours.  Given his age there are pros and cons of this approach however patient does have significant pain and would likely benefit from these medications.  We will also give prednisone for short course.  Strict return precautions given.  Patient given 1 dose of this prior to discharge. Patient is agreeable to plan.  He will follow closely with his primary care doctor.  Final Clinical Impression(s) / ED Diagnoses Final diagnoses:  Left hand pain    Rx / DC Orders ED Discharge Orders         Ordered    predniSONE (DELTASONE) 10 MG tablet  Daily        09/25/20 1323    ibuprofen (ADVIL) 400 MG tablet  Every 8 hours PRN        09/25/20 1323           Tedd Sias, [place] 09/25/20 1425    Carmin Muskrat, MD 09/27/20 1813

## 2020-10-09 ENCOUNTER — Other Ambulatory Visit: Payer: Self-pay | Admitting: Interventional Cardiology

## 2020-12-11 ENCOUNTER — Other Ambulatory Visit: Payer: Self-pay

## 2020-12-11 ENCOUNTER — Encounter: Payer: Self-pay | Admitting: Plastic Surgery

## 2020-12-11 ENCOUNTER — Ambulatory Visit (INDEPENDENT_AMBULATORY_CARE_PROVIDER_SITE_OTHER): Payer: Medicare Other | Admitting: Plastic Surgery

## 2020-12-11 VITALS — BP 158/90 | HR 112 | Ht 67.0 in | Wt 192.6 lb

## 2020-12-11 DIAGNOSIS — C4491 Basal cell carcinoma of skin, unspecified: Secondary | ICD-10-CM

## 2020-12-11 NOTE — Progress Notes (Signed)
Referring Provider Gaynelle Arabian, MD [address] [place],  [place] [address]   CC:  Chief Complaint  Patient presents with  . Advice Only      Jeffrey Zimmerman is an 85 y.o. male.  HPI: Patient presents for newly diagnosed basal cell carcinoma of the right cheek.  Patient is uncertain how long the lesion has been present for.  He was sent by his Mohs surgeon out of concerns for not being able to tolerate local anesthesia.  Patient is hard of hearing and has difficulty communicating for that reason.  Allergies  Allergen Reactions  . Codeine Other (See Comments)    Doesn't help with with pain control.     Outpatient Encounter Medications as of 12/11/2020  Medication Sig  . amLODipine (NORVASC) 5 MG tablet TAKE 1 TABLET BY MOUTH DAILY  . HYDROcodone-homatropine (HYCODAN) 5-1.5 MG/5ML syrup Take 5 mLs by mouth every 8 (eight) hours as needed for cough.  Marland Kitchen lisinopril (ZESTRIL) 10 MG tablet Take 1 tablet (10 mg total) by mouth daily.  . metoprolol tartrate (LOPRESSOR) 25 MG tablet TAKE 1/2 TABLET BY MOUTH TWICE A DAY  . nitroGLYCERIN (NITROSTAT) 0.4 MG SL tablet Place 1 tablet (0.4 mg total) under the tongue every 5 (five) minutes as needed for chest pain.  . pravastatin (PRAVACHOL) 20 MG tablet TAKE 1 TABLET (20 MG TOTAL) BY MOUTH DAILY. PLEASE SCHEDULE APPOINTMENT FOR FUTURE REFILLS.  Marland Kitchen tamsulosin (FLOMAX) 0.4 MG CAPS capsule Take 0.4 mg by mouth daily.  Marland Kitchen triamterene-hydrochlorothiazide (MAXZIDE) 75-50 MG tablet TAKE 1/2 TABLET BY MOUTH EVERY OTHER DAY  . clopidogrel (PLAVIX) 75 MG tablet Take 1 tablet (75 mg total) by mouth daily. (Patient not taking: Reported on 12/11/2020)   No facility-administered encounter medications on file as of 12/11/2020.     Past Medical History:  Diagnosis Date  . Adenomatous colon polyp 2/10  . Basal cell carcinoma   . Cancer ([place])    colon  . Coronary artery disease    04/21/10 Anter MI with BMS to LAD with residual proximal LAD EF  40%  . Gout   . History of cholecystectomy   . History of colonoscopy    1/5 AND 2/10  . Hypertension   . Irritable bowel syndrome   . Myocardial infarct ([place])   . SVT (supraventricular tachycardia) (HCC)     Past Surgical History:  Procedure Laterality Date  . APPENDECTOMY    . CARDIAC CATHETERIZATION     04/21/10 Anter MI with BMS to LAD with residual proximal LAD EF 40%  . KNEE ARTHOSCOPY Bilateral   . PARTIAL LEFT NEPHRECTOMY     (for oncocytoma-benign)    Family History  Problem Relation Age of Onset  . Heart attack Mother   . CAD Mother   . Colon cancer Father   . Rheum arthritis Maternal Grandfather     Social History   Social History Narrative  . Not on file     Review of Systems General: Denies fevers, chills, weight loss CV: Denies chest pain, shortness of breath, palpitations  Physical Exam Vitals with BMI 12/11/2020 09/25/2020 09/25/2020  Height 5\' 7"  - -  Weight 192 lbs 10 oz - -  BMI 18.84 - -  Systolic 166 - 063  Diastolic 90 - 92  Pulse 016 87 87    General:  No acute distress,  Alert and oriented, Non-Toxic, Normal speech and affect HEENT normocephalic.  He has a 3 to 4 cm diameter  mass in the right zygomatic area.  It feels like there is a palpable nodule in the inferior aspect of the parotid on that side.  It does not feel fixed to the bone but it clearly has invaded easily.  This is corresponding to his diagnosis of basal cell carcinoma.  He seems to have intact cranial nerves and no other obvious lesions.  He does point out a lesion in the right medial cheek area that he is concerned about but I do not have any pathologic information on this location.  Assessment/Plan Patient presents with a clinically invasive basal cell carcinoma on the right zygomatic area.  There is a concern for node metastasis to a parotid node clinically on exam.  I will plan to send him for CT scan for staging to more definitively evaluate this.  We may need to involve ENT  but will wait for the scan results.  Patient seems to understand and will plan to direct the plan after the imaging  Cindra Presume 12/11/2020, 4:42 PM

## 2020-12-12 ENCOUNTER — Telehealth: Payer: Self-pay | Admitting: Plastic Surgery

## 2020-12-12 NOTE — Telephone Encounter (Signed)
Scheduled patient's CT scan for 12/18/2020 at 10:45am. Patient should not have anything but liquids 4 hours prior to appt.

## 2020-12-12 NOTE — Telephone Encounter (Signed)
Received call from Olivia Mackie, resident coordinator at the patient's living location. Olivia Mackie called to clarify the appointment on 6/1 for the CT scan. I read the information previously documented by Broadus John to [facility] for notation. Olivia Mackie advised that the patient will also be receiving medical care at [facility] starting June 1. They will be the primary point of contact for his medical care needs starting June 1. Also, the patient's insurance may revert back to the traditional Medicare coverage. Olivia Mackie had a copy of that card, with ID # is [id number]. Patient was advised by Olivia Mackie to bring the card to the appointments.

## 2020-12-12 NOTE — Telephone Encounter (Signed)
Scheduled patient's CT scan for 12/18/2020 with 10:45am arrival. Patient should not have anything but liquids 4 hours prior to appt. Wife was not able to understand details of appointment so she gave the phone to her supervisor, Sunday Spillers, who wrote down appt information for her.

## 2020-12-18 ENCOUNTER — Encounter (HOSPITAL_BASED_OUTPATIENT_CLINIC_OR_DEPARTMENT_OTHER): Payer: Self-pay

## 2020-12-18 ENCOUNTER — Other Ambulatory Visit: Payer: Self-pay

## 2020-12-18 ENCOUNTER — Ambulatory Visit (HOSPITAL_BASED_OUTPATIENT_CLINIC_OR_DEPARTMENT_OTHER)
Admission: RE | Admit: 2020-12-18 | Discharge: 2020-12-18 | Disposition: A | Discharge status: Home / Self Care | Payer: Medicare (Managed Care) | Source: Ambulatory Visit | Attending: Plastic Surgery | Admitting: Plastic Surgery

## 2020-12-18 ENCOUNTER — Other Ambulatory Visit: Payer: Self-pay | Admitting: Plastic Surgery

## 2020-12-18 DIAGNOSIS — C4491 Basal cell carcinoma of skin, unspecified: Secondary | ICD-10-CM

## 2020-12-18 LAB — POCT I-STAT CREATININE: Creatinine, Ser: 0.8 mg/dL (ref 0.61–1.24)

## 2020-12-18 MED ORDER — IOHEXOL 300 MG/ML  SOLN
75.0000 mL | Freq: Once | INTRAMUSCULAR | Status: AC | PRN
  Administered 2020-12-18: 75 mL via INTRAVENOUS

## 2020-12-18 NOTE — Progress Notes (Signed)
Call from Radiology at [facility] - for new order as follows: CT scan with contrast I will forward this order to Dr. Claudia Desanctis for review & to sign

## 2021-01-08 ENCOUNTER — Other Ambulatory Visit: Payer: Self-pay

## 2021-01-08 DIAGNOSIS — C44310 Basal cell carcinoma of skin of unspecified parts of face: Secondary | ICD-10-CM

## 2021-02-13 ENCOUNTER — Ambulatory Visit: Payer: Medicare Other | Admitting: Plastic Surgery

## 2021-03-07 ENCOUNTER — Other Ambulatory Visit: Payer: Self-pay | Admitting: Interventional Cardiology

## 2021-03-19 ENCOUNTER — Ambulatory Visit (INDEPENDENT_AMBULATORY_CARE_PROVIDER_SITE_OTHER): Payer: Medicare (Managed Care) | Admitting: Plastic Surgery

## 2021-03-19 ENCOUNTER — Encounter: Payer: Self-pay | Admitting: Plastic Surgery

## 2021-03-19 ENCOUNTER — Other Ambulatory Visit: Payer: Self-pay

## 2021-03-19 DIAGNOSIS — C44310 Basal cell carcinoma of skin of unspecified parts of face: Secondary | ICD-10-CM | POA: Diagnosis not present

## 2021-03-19 NOTE — Progress Notes (Signed)
Referring Provider Gaynelle Arabian, MD [address] [place],   [id number]   CC:  Chief Complaint  Patient presents with   Follow-up      Jeffrey Zimmerman is an 85 y.o. male.  HPI: Patient presents to review the scans of an invasive basal cell carcinoma in his right cheek.  Due to the size and depth I wanted to get a CT scan for staging.  No obvious metastatic disease was noted.  They did comment on a small area of concern within the parotid however.  Patient thinks that the area has gotten smaller but is extending laterally and posteriorly.  Allergies  Allergen Reactions   Codeine Other (See Comments)    Doesn't help with with pain control.     Outpatient Encounter Medications as of 03/19/2021  Medication Sig   amLODipine (NORVASC) 5 MG tablet TAKE 1 TABLET BY MOUTH DAILY   HYDROcodone-homatropine (HYCODAN) 5-1.5 MG/5ML syrup Take 5 mLs by mouth every 8 (eight) hours as needed for cough.   lisinopril (ZESTRIL) 10 MG tablet TAKE 1 TABLET BY MOUTH EVERY DAY   metoprolol tartrate (LOPRESSOR) 25 MG tablet TAKE 1/2 TABLET BY MOUTH TWICE A DAY   nitroGLYCERIN (NITROSTAT) 0.4 MG SL tablet Place 1 tablet (0.4 mg total) under the tongue every 5 (five) minutes as needed for chest pain.   pravastatin (PRAVACHOL) 20 MG tablet TAKE 1 TABLET (20 MG TOTAL) BY MOUTH DAILY. PLEASE SCHEDULE APPOINTMENT FOR FUTURE REFILLS.   tamsulosin (FLOMAX) 0.4 MG CAPS capsule Take 0.4 mg by mouth daily.   triamterene-hydrochlorothiazide (MAXZIDE) 75-50 MG tablet TAKE 1/2 TABLET BY MOUTH EVERY OTHER DAY   clopidogrel (PLAVIX) 75 MG tablet Take 1 tablet (75 mg total) by mouth daily. (Patient not taking: No sig reported)   No facility-administered encounter medications on file as of 03/19/2021.     Past Medical History:  Diagnosis Date   Adenomatous colon polyp 2/10   Basal cell carcinoma    Cancer ([place])    colon   Coronary artery disease    04/21/10 Anter MI with BMS to LAD with residual  proximal LAD EF 40%   Gout    History of cholecystectomy    History of colonoscopy    1/5 AND 2/10   Hypertension    Irritable bowel syndrome    Myocardial infarct (HCC)    SVT (supraventricular tachycardia) ([place])     Past Surgical History:  Procedure Laterality Date   APPENDECTOMY     CARDIAC CATHETERIZATION     04/21/10 Anter MI with BMS to LAD with residual proximal LAD EF 40%   KNEE ARTHOSCOPY Bilateral    PARTIAL LEFT NEPHRECTOMY     (for oncocytoma-benign)    Family History  Problem Relation Age of Onset   Heart attack Mother    CAD Mother    Colon cancer Father    Rheum arthritis Maternal Grandfather     Social History   Social History Narrative   Not on file     Review of Systems General: Denies fevers, chills, weight loss CV: Denies chest pain, shortness of breath, palpitations  Physical Exam Vitals with BMI 12/11/2020 09/25/2020 09/25/2020  Height '5\' 7"'$  - -  Weight 192 lbs 10 oz - -  BMI 0000000 - -  Systolic 0000000 - [id number]  Diastolic 90 - 92  Pulse [id number] 87 87    General:  No acute distress,  Alert and oriented, Non-Toxic, Normal speech and affect Examination of  the right cheek shows a contracted ulcerative lesion.  It has indistinct borders.  It feels tethered to the deeper structures.  No obvious clinical lymphadenopathy.  Assessment/Plan Patient presents with a deeply invasive basal cell on the right cheek.  There is a concern for some abnormality on the CT scan in the parotid gland.  I will plan to have the patient see Dr. Constance Holster for evaluation of this.  We will plan surgical excision of this.  Depending on the size he may end up needing adjacent tissue transfer for reconstruction.  We discussed pros and cons of that and the risk and benefits include bleeding, infection, damage to surrounding structures need for additional procedures.  Cindra Presume 03/19/2021, 3:15 PM

## 2021-05-16 ENCOUNTER — Telehealth: Payer: Self-pay | Admitting: Plastic Surgery

## 2021-05-16 NOTE — Telephone Encounter (Signed)
LVM with Patient's wife, Lucianne Lei, to reach out to patient to discuss surgery date/time for coordianted case with Constance Holster and [place]. Phone number for patient is not working and cannot get through. Requested a call back to discuss

## 2021-06-04 ENCOUNTER — Encounter: Payer: Self-pay | Admitting: Surgical

## 2021-06-04 ENCOUNTER — Telehealth: Payer: Self-pay

## 2021-06-04 ENCOUNTER — Other Ambulatory Visit: Payer: Self-pay

## 2021-06-04 ENCOUNTER — Ambulatory Visit (INDEPENDENT_AMBULATORY_CARE_PROVIDER_SITE_OTHER): Payer: Medicare (Managed Care) | Admitting: Surgical

## 2021-06-04 VITALS — BP 176/93 | HR 81 | Ht 67.0 in | Wt 188.0 lb

## 2021-06-04 DIAGNOSIS — C44310 Basal cell carcinoma of skin of unspecified parts of face: Secondary | ICD-10-CM

## 2021-06-04 NOTE — Progress Notes (Addendum)
Patient ID: Jeffrey Zimmerman, male    DOB: 06-Dec-1935, 85 y.o.   MRN: [id number]  Chief Complaint  Patient presents with   Pre-op Exam       ICD-10-CM   1. Basal cell carcinoma (BCC) of skin of face, unspecified part of face  C44.310       History of Present Illness: Jeffrey Zimmerman is a 85 y.o.  male  with a history of invasive basal cell carcinoma.  He presents for preoperative evaluation for upcoming procedure, right cheek reconstruction with possible adjacent tissue transfer and tissue rearrangement and possible application of skin substitute, scheduled for 06/23/2021 with Dr. Claudia Desanctis in conjunction with Dr. Constance Holster.  Dr. Constance Holster is planning on wide resection of right cheek skin basal cell carcinoma with facial nerve dissection and superficial parotidectomy.  The patient has not had problems with anesthesia. No history of DVT/PE.  No family history of DVT/PE.  No family or personal history of bleeding or clotting disorders.  Patient is not currently taking any blood thinners.   [facility] an [facility].  They are also providing him transportation.  PMH Significant for: Hypertension, history of colon cancer, coronary artery disease, gout, IBS, history of MI, SVT  He is currently not on his Plavix, reports he has stopped taking this.  He has a history of 2 stents placed.  He reports this was approximately 13 years ago.  He reports no current chest pain at rest or with ambulation.  He reports he is able to ambulate up stairs without any difficulty or chest pain or shortness of breath.  He is not taking any aspirin either.  Per EMR review he had several LAD stents placed in 2011 for anterior STEMI.  He was last seen by cardiology in December 2021.  Past Medical History: Allergies: Allergies  Allergen Reactions   Codeine Other (See Comments)    Doesn't help with with pain control.     Current Medications:  Current Outpatient  Medications:    amLODipine (NORVASC) 5 MG tablet, TAKE 1 TABLET BY MOUTH DAILY, Disp: 90 tablet, Rfl: 3   lisinopril (ZESTRIL) 10 MG tablet, TAKE 1 TABLET BY MOUTH EVERY DAY, Disp: 90 tablet, Rfl: 3   metoprolol tartrate (LOPRESSOR) 25 MG tablet, TAKE 1/2 TABLET BY MOUTH TWICE A DAY, Disp: 90 tablet, Rfl: 3   nitroGLYCERIN (NITROSTAT) 0.4 MG SL tablet, Place 1 tablet (0.4 mg total) under the tongue every 5 (five) minutes as needed for chest pain., Disp: 75 tablet, Rfl: 1   pravastatin (PRAVACHOL) 20 MG tablet, TAKE 1 TABLET (20 MG TOTAL) BY MOUTH DAILY. PLEASE SCHEDULE APPOINTMENT FOR FUTURE REFILLS., Disp: 90 tablet, Rfl: 3   tamsulosin (FLOMAX) 0.4 MG CAPS capsule, Take 0.4 mg by mouth daily., Disp: , Rfl:    triamterene-hydrochlorothiazide (MAXZIDE) 75-50 MG tablet, TAKE 1/2 TABLET BY MOUTH EVERY OTHER DAY, Disp: 90 tablet, Rfl: 1   clopidogrel (PLAVIX) 75 MG tablet, Take 1 tablet (75 mg total) by mouth daily. (Patient not taking: No sig reported), Disp: 90 tablet, Rfl: 3   HYDROcodone-homatropine (HYCODAN) 5-1.5 MG/5ML syrup, Take 5 mLs by mouth every 8 (eight) hours as needed for cough. (Patient not taking: Reported on 06/04/2021), Disp: 120 mL, Rfl: 0  Past Medical Problems: Past Medical History:  Diagnosis Date   Adenomatous colon polyp 2/10   Basal cell carcinoma    Cancer ([place])    colon   Coronary  artery disease    04/21/10 Anter MI with BMS to LAD with residual proximal LAD EF 40%   Gout    History of cholecystectomy    History of colonoscopy    1/5 AND 2/10   Hypertension    Irritable bowel syndrome    Myocardial infarct (HCC)    SVT (supraventricular tachycardia) (HCC)     Past Surgical History: Past Surgical History:  Procedure Laterality Date   APPENDECTOMY     CARDIAC CATHETERIZATION     04/21/10 Anter MI with BMS to LAD with residual proximal LAD EF 40%   KNEE ARTHOSCOPY Bilateral    PARTIAL LEFT NEPHRECTOMY     (for oncocytoma-benign)    Social History: Social  History   Socioeconomic History   Marital status: Married    Spouse name: Not on file   Number of children: Not on file   Years of education: Not on file   Highest education level: Not on file  Occupational History   Not on file  Tobacco Use   Smoking status: Never   Smokeless tobacco: Never  Substance and Sexual Activity   Alcohol use: No   Drug use: No   Sexual activity: Not on file  Other Topics Concern   Not on file  Social History Narrative   Not on file   Social Determinants of Health   Financial Resource Strain: Not on file  Food Insecurity: Not on file  Transportation Needs: Not on file  Physical Activity: Not on file  Stress: Not on file  Social Connections: Not on file  Intimate Partner Violence: Not on file    Family History: Family History  Problem Relation Age of Onset   Heart attack Mother    CAD Mother    Colon cancer Father    Rheum arthritis Maternal Grandfather     Review of Systems: Review of Systems  Constitutional: Negative.   Respiratory:  Negative for shortness of breath.   Cardiovascular:  Negative for chest pain, palpitations and orthopnea.  Gastrointestinal: Negative.    Physical Exam: Vital Signs BP (!) 176/93 (BP Location: Left Arm, Patient Position: Sitting, Cuff Size: Normal)   Pulse 81   Ht 5\' 7"  (1.702 m)   Wt 188 lb (85.3 kg)   SpO2 94%   BMI 29.44 kg/m   Physical Exam  Constitutional:      General: Not in acute distress.    Appearance: Normal appearance. Not ill-appearing.  HENT:     Head: Normocephalic and atraumatic.  Right face changing skin lesions noted Eyes:     Pupils: Pupils are equal, round Neck:     Musculoskeletal: Normal range of motion.  Cardiovascular:     Rate and Rhythm: Normal rate    Pulses: Normal pulses.  Pulmonary:     Effort: Pulmonary effort is normal. No respiratory distress.  Musculoskeletal: Normal range of motion.  Skin:    General: Skin is warm and dry.     Findings: No erythema  or rash.  Neurological:     General: No focal deficit present.     Mental Status: Alert and oriented to person, place, and time. Mental status is at baseline.     Motor: No weakness.  Psychiatric:        Mood and Affect: Mood normal.        Behavior: Behavior normal.    Assessment/Plan: The patient is scheduled for right cheek reconstruction with possible adjacent tissue transfer and tissue rearrangement and possible application  of skin substitute after wide resection of right cheek basal cell carcinoma with facial nerve dissection and superficial parotidectomy with Dr. Claudia Desanctis in conjunction with Dr. Constance Holster with ENT. Risks, benefits, and alternatives of procedure discussed, questions answered and consent obtained.    Smoking Status: Non-smoker; Counseling Given?  N/A  Caprini Score: 7, high; Risk Factors include: Age, BMI greater than 25, and length of planned surgery. Recommendation for mechanical prophylaxis. Encourage early ambulation.   Pictures obtained: Photos were taken today of patient's right cheek.  Patient provided permission for pictures to be placed in patient's chart.  Post-op Rx sent to pharmacy:  No prescriptions will be sent to patient's pharmacy at this time, we will send postop.  Patient was provided with the General Surgical Risk consent document and Pain Medication Agreement.  Patient was unable to read through the consent form on his own due to difficulty with reading due to his vision, I personally went through the entire consent form with the patient covering the entire consent form and answering any of his questions associated with the consent.  We discussed all of the risks associated with Dr. Keane Scrape portion of the procedure which is the adjacent tissue transfer/tissue rearrangement and possible application of skin substitute.  All of his questions were answered to his content.  I recommend he call us with any further questions.  The risk consent form and pain medication  agreement will be scanned into patient's chart.  A significant portion of his preoperative appointment was spent covering the consent form together, discussing specific risks associated with the scheduled procedure and discussing his personal risk factors.   Patient is aware that negative resection margins is not guaranteed and that additional surgery may be necessary for additional excision.  He is also aware that we will plan to place Integra over to the cheek defect after excision to allow for negative margins to come back prior to any flaps/tissue rearrangement.  Patient is currently prescribed Plavix, but reports he has not taken this.  He reports no chest pain or shortness of breath.  He is aware that I will send a cardiac clearance to his cardiologist at [facility] heart care.   Electronically signed by: Carola Rhine Doniven Vanpatten, PA-C 06/04/2021 12:59 PM

## 2021-06-04 NOTE — H&P (View-Only) (Signed)
Patient ID: Jeffrey Zimmerman, male    DOB: 01-Nov-1935, 85 y.o.   MRN: [id number]  Chief Complaint  Patient presents with   Pre-op Exam       ICD-10-CM   1. Basal cell carcinoma (BCC) of skin of face, unspecified part of face  C44.310       History of Present Illness: Jeffrey Zimmerman is a 85 y.o.  male  with a history of invasive basal cell carcinoma.  He presents for preoperative evaluation for upcoming procedure, right cheek reconstruction with possible adjacent tissue transfer and tissue rearrangement and possible application of skin substitute, scheduled for 06/23/2021 with Dr. Claudia Desanctis in conjunction with Dr. Constance Holster.  Dr. Constance Holster is planning on wide resection of right cheek skin basal cell carcinoma with facial nerve dissection and superficial parotidectomy.  The patient has not had problems with anesthesia. No history of DVT/PE.  No family history of DVT/PE.  No family or personal history of bleeding or clotting disorders.  Patient is not currently taking any blood thinners.   [facility].  They are also providing him transportation.  PMH Significant for: Hypertension, history of colon cancer, coronary artery disease, gout, IBS, history of MI, SVT  He is currently not on his Plavix, reports he has stopped taking this.  He has a history of 2 stents placed.  He reports this was approximately 13 years ago.  He reports no current chest pain at rest or with ambulation.  He reports he is able to ambulate up stairs without any difficulty or chest pain or shortness of breath.  He is not taking any aspirin either.  Per EMR review he had several LAD stents placed in 2011 for anterior STEMI.  He was last seen by cardiology in December 2021.  Past Medical History: Allergies: Allergies  Allergen Reactions   Codeine Other (See Comments)    Doesn't help with with pain control.     Current Medications:  Current Outpatient  Medications:    amLODipine (NORVASC) 5 MG tablet, TAKE 1 TABLET BY MOUTH DAILY, Disp: 90 tablet, Rfl: 3   lisinopril (ZESTRIL) 10 MG tablet, TAKE 1 TABLET BY MOUTH EVERY DAY, Disp: 90 tablet, Rfl: 3   metoprolol tartrate (LOPRESSOR) 25 MG tablet, TAKE 1/2 TABLET BY MOUTH TWICE A DAY, Disp: 90 tablet, Rfl: 3   nitroGLYCERIN (NITROSTAT) 0.4 MG SL tablet, Place 1 tablet (0.4 mg total) under the tongue every 5 (five) minutes as needed for chest pain., Disp: 75 tablet, Rfl: 1   pravastatin (PRAVACHOL) 20 MG tablet, TAKE 1 TABLET (20 MG TOTAL) BY MOUTH DAILY. PLEASE SCHEDULE APPOINTMENT FOR FUTURE REFILLS., Disp: 90 tablet, Rfl: 3   tamsulosin (FLOMAX) 0.4 MG CAPS capsule, Take 0.4 mg by mouth daily., Disp: , Rfl:    triamterene-hydrochlorothiazide (MAXZIDE) 75-50 MG tablet, TAKE 1/2 TABLET BY MOUTH EVERY OTHER DAY, Disp: 90 tablet, Rfl: 1   clopidogrel (PLAVIX) 75 MG tablet, Take 1 tablet (75 mg total) by mouth daily. (Patient not taking: No sig reported), Disp: 90 tablet, Rfl: 3   HYDROcodone-homatropine (HYCODAN) 5-1.5 MG/5ML syrup, Take 5 mLs by mouth every 8 (eight) hours as needed for cough. (Patient not taking: Reported on 06/04/2021), Disp: 120 mL, Rfl: 0  Past Medical Problems: Past Medical History:  Diagnosis Date   Adenomatous colon polyp 2/10   Basal cell carcinoma    Cancer ([place])    colon   Coronary  artery disease    04/21/10 Anter MI with BMS to LAD with residual proximal LAD EF 40%   Gout    History of cholecystectomy    History of colonoscopy    1/5 AND 2/10   Hypertension    Irritable bowel syndrome    Myocardial infarct (HCC)    SVT (supraventricular tachycardia) (HCC)     Past Surgical History: Past Surgical History:  Procedure Laterality Date   APPENDECTOMY     CARDIAC CATHETERIZATION     04/21/10 Anter MI with BMS to LAD with residual proximal LAD EF 40%   KNEE ARTHOSCOPY Bilateral    PARTIAL LEFT NEPHRECTOMY     (for oncocytoma-benign)    Social History: Social  History   Socioeconomic History   Marital status: Married    Spouse name: Not on file   Number of children: Not on file   Years of education: Not on file   Highest education level: Not on file  Occupational History   Not on file  Tobacco Use   Smoking status: Never   Smokeless tobacco: Never  Substance and Sexual Activity   Alcohol use: No   Drug use: No   Sexual activity: Not on file  Other Topics Concern   Not on file  Social History Narrative   Not on file   Social Determinants of Health   Financial Resource Strain: Not on file  Food Insecurity: Not on file  Transportation Needs: Not on file  Physical Activity: Not on file  Stress: Not on file  Social Connections: Not on file  Intimate Partner Violence: Not on file    Family History: Family History  Problem Relation Age of Onset   Heart attack Mother    CAD Mother    Colon cancer Father    Rheum arthritis Maternal Grandfather     Review of Systems: Review of Systems  Constitutional: Negative.   Respiratory:  Negative for shortness of breath.   Cardiovascular:  Negative for chest pain, palpitations and orthopnea.  Gastrointestinal: Negative.    Physical Exam: Vital Signs BP (!) 176/93 (BP Location: Left Arm, Patient Position: Sitting, Cuff Size: Normal)   Pulse 81   Ht 5\' 7"  (1.702 m)   Wt 188 lb (85.3 kg)   SpO2 94%   BMI 29.44 kg/m   Physical Exam  Constitutional:      General: Not in acute distress.    Appearance: Normal appearance. Not ill-appearing.  HENT:     Head: Normocephalic and atraumatic.  Right face changing skin lesions noted Eyes:     Pupils: Pupils are equal, round Neck:     Musculoskeletal: Normal range of motion.  Cardiovascular:     Rate and Rhythm: Normal rate    Pulses: Normal pulses.  Pulmonary:     Effort: Pulmonary effort is normal. No respiratory distress.  Musculoskeletal: Normal range of motion.  Skin:    General: Skin is warm and dry.     Findings: No erythema  or rash.  Neurological:     General: No focal deficit present.     Mental Status: Alert and oriented to person, place, and time. Mental status is at baseline.     Motor: No weakness.  Psychiatric:        Mood and Affect: Mood normal.        Behavior: Behavior normal.    Assessment/Plan: The patient is scheduled for right cheek reconstruction with possible adjacent tissue transfer and tissue rearrangement and possible application  of skin substitute after wide resection of right cheek basal cell carcinoma with facial nerve dissection and superficial parotidectomy with Dr. Claudia Desanctis in conjunction with Dr. Constance Holster with ENT. Risks, benefits, and alternatives of procedure discussed, questions answered and consent obtained.    Smoking Status: Non-smoker; Counseling Given?  N/A  Caprini Score: 7, high; Risk Factors include: Age, BMI greater than 25, and length of planned surgery. Recommendation for mechanical prophylaxis. Encourage early ambulation.   Pictures obtained: Photos were taken today of patient's right cheek.  Patient provided permission for pictures to be placed in patient's chart.  Post-op Rx sent to pharmacy:  No prescriptions will be sent to patient's pharmacy at this time, we will send postop.  Patient was provided with the General Surgical Risk consent document and Pain Medication Agreement.  Patient was unable to read through the consent form on his own due to difficulty with reading due to his vision, I personally went through the entire consent form with the patient covering the entire consent form and answering any of his questions associated with the consent.  We discussed all of the risks associated with Dr. Keane Scrape portion of the procedure which is the adjacent tissue transfer/tissue rearrangement and possible application of skin substitute.  All of his questions were answered to his content.  I recommend he call us with any further questions.  The risk consent form and pain medication  agreement will be scanned into patient's chart.  A significant portion of his preoperative appointment was spent covering the consent form together, discussing specific risks associated with the scheduled procedure and discussing his personal risk factors.   Patient is aware that negative resection margins is not guaranteed and that additional surgery may be necessary for additional excision.  He is also aware that we will plan to place Integra over to the cheek defect after excision to allow for negative margins to come back prior to any flaps/tissue rearrangement.  Patient is currently prescribed Plavix, but reports he has not taken this.  He reports no chest pain or shortness of breath.  He is aware that I will send a cardiac clearance to his cardiologist at [facility] heart care.   Electronically signed by: Carola Rhine Tareva Leske, PA-C 06/04/2021 12:59 PM

## 2021-06-04 NOTE — Telephone Encounter (Signed)
Called [facility], spoke with Nira Conn of transportation dept. Advised patient will need a driver to be at [place], 06/23/2021 at 5:30 am as well as picked up after. Advised he will need an appointment with his cardiologist, Dr. Irish Lack, for cardio, surgical and medical clearance prior to surgery. Left a message for Allegra Lai, administrator of [facility] dept at [place] to call our office to provide fax number to send post wound care instructions/orders. Will also need to make sure patient is NPO after midnight prior to surgery.

## 2021-06-05 ENCOUNTER — Telehealth: Payer: Self-pay | Admitting: *Deleted

## 2021-06-05 ENCOUNTER — Encounter: Payer: Medicare (Managed Care) | Admitting: Surgical

## 2021-06-05 NOTE — Telephone Encounter (Signed)
I tried to reach the pt to schedule an appt for pre op clearance. I tried all numbers on file for the pt, though no success in reaching the pt. I even tried to pt's wife # [phone] though recording vm is not set up and I could not leave a message. Pt ph# [phone], recording states call cannot be completed. I tried each of these numbers x 2 to be sure I was dialing them correctly. I will send update to surgeon's office in hopes that if they do s/w the pt to let him know that he is going to need to call cardiology [phone] and schedule an appt for pre op clearance.

## 2021-06-05 NOTE — Telephone Encounter (Signed)
[place], MD  Chart reviewed as part of pre-operative protocol coverage. Because of Jeffrey Zimmerman's past medical history and time since last visit, he/she will require a follow-up visit in order to better assess preoperative cardiovascular risk.  Pre-op covering staff: - Please schedule appointment and call patient to inform them. - Please contact requesting surgeon's office via preferred method (i.e, phone, fax) to inform them of need for appointment prior to surgery.  If applicable, this message will also be routed to pharmacy pool and/or primary cardiologist for input on holding anticoagulant/antiplatelet agent as requested below so that this information is available at time of patient's appointment.   Deberah Pelton, NP  06/05/2021, 1:09 PM

## 2021-06-05 NOTE — Telephone Encounter (Signed)
I have a left a very detailed message for Leda Gauze with [facility], see previous notes from Dr. Keane Scrape office to try to reach [place]. I left message that I have tentatively scheduled pt for pre op appt 06/18/21 @ 12:15 with Ermalinda Barrios, PAC. Left message that I am going to need for Leda Gauze to call the office back and confirm this appt for the pt or change if we need too, though schedules are filling up very quickly. If I do not hear back by tomorrow 5 pm I will cancel the appt, to be used by another pt.

## 2021-06-05 NOTE — Telephone Encounter (Signed)
Our office received a clearance request. However, I did need to reach to the surgeon's office to confirm DOB was 1935-08-07, clearance form stated 12/24/1957. I s/w Raquel Sarna who did confirm for me the correct DOB is in fact 04/06/1936.    Pre-operative Risk Assessment    Patient Name: Jeffrey Zimmerman  DOB: Nov 12, 1935 MRN: [id number]      Request for Surgical Clearance   Procedure:   RIGHT CHEEK RECONSTRUCTION WITH POSSIBLE ADJACENT TISSUE TRANSFER AND POSSIBLE APPLICATION OF SKIN SUBSTITUTE   Date of Surgery: Clearance 06/23/21                                 Surgeon:  DR. Sherri Rad, [place] Surgeon's Group or Practice Name:  [facility] Phone number:  [phone] Fax number:  [phone]   Type of Clearance Requested: - Medical  - Pharmacy:  Hold Clopidogrel (Plavix)     Type of Anesthesia:   General    Additional requests/questions:   Jiles Prows   06/05/2021, 12:46 PM

## 2021-06-06 NOTE — Telephone Encounter (Signed)
Left message x 2 for Jeffrey Zimmerman at [facility] to please call our office back and either confirm appt for the pt or change if needed.

## 2021-06-09 NOTE — Telephone Encounter (Signed)
Leda Gauze from [place] called back and confirmed the information in regard to pt needing an appt for pre op clearance. Leda Gauze confirmed appt scheduled as 06/18/21 @ 12:15 with Ermalinda Barrios, PAC. Leda Gauze stated she will get a hold of the pt and get the pt to office for the pre op appt. I will forward notes to [facility] for upcoming appt. I will send FYI to requesting office pt has appt.

## 2021-06-16 NOTE — Telephone Encounter (Signed)
Generally the clearance and medications to be held will be handled by the provider seeing the pt. I will forward this note to our pre op pool for review again as well will address to Dr. Irish Lack.

## 2021-06-16 NOTE — Telephone Encounter (Signed)
OK to hold Plavix 5 days prior to surgery. 

## 2021-06-16 NOTE — Telephone Encounter (Signed)
Dr. Irish Lack Pt has anterior STEMI 2011 treated with several LAD stents. You last saw him on 07/05/20 - at that visit his ASA was stopped and plavix started.   We are asked to hold plavix for right cheek reconstruction with possible tissue transfer.   OK to hold plavix? He will be seeing Ermalinda Barrios for preop clearance on 06/18/21.

## 2021-06-16 NOTE — Progress Notes (Signed)
Cardiology Office Note    Date:  06/18/2021   ID:  Jeffrey Zimmerman, DOB February 03, 1936, MRN [id number]   PCP:  Gaynelle Arabian, MD   [place]  Cardiologist:  Larae Grooms, MD   Advanced Practice Provider:  No care team member to display Electrophysiologist:  None   [phone]   Chief Complaint  Patient presents with   Pre-op Exam     History of Present Illness:  Jeffrey Zimmerman is a 85 y.o. male with history of CAD status post anterior STEMI 2011 treated with several LAD stents, no ischemia on Myoview 2014 EF improved to 61%, hypertension, history of atrial tachycardia.  Patient last saw Dr. Irish Lack 06/2020 ASA stopped and started on Plavix.  Patient is being seen today for preoperative clearance for right cheek reconstruction with possible adjacent tissue transfer and possible application of skin substitute by Dr. Mingo Amber 06/23/2021.  Patient denies any chest pain, palpitations, dyspnea, dizziness or presyncope.  He is very hard of hearing.  He is severely constipated and very uncomfortable from it in our office.  He does have lower extremity edema and takes Maxide half a tablet every other day.  He eats frozen dinners and canned foods daily.     Past Medical History:  Diagnosis Date   Adenomatous colon polyp 2/10   Basal cell carcinoma    Cancer ([place])    colon   Coronary artery disease    04/21/10 Anter MI with BMS to LAD with residual proximal LAD EF 40%   Gout    History of cholecystectomy    History of colonoscopy    1/5 AND 2/10   Hypertension    Irritable bowel syndrome    Myocardial infarct [facility])    SVT (supraventricular tachycardia) ([place])     Past Surgical History:  Procedure Laterality Date   APPENDECTOMY     CARDIAC CATHETERIZATION     04/21/10 Anter MI with BMS to LAD with residual proximal LAD EF 40%   KNEE ARTHOSCOPY Bilateral    PARTIAL LEFT NEPHRECTOMY     (for oncocytoma-benign)    Current Medications: Current  Meds  Medication Sig   amLODipine (NORVASC) 5 MG tablet TAKE 1 TABLET BY MOUTH DAILY   clopidogrel (PLAVIX) 75 MG tablet Take 1 tablet (75 mg total) by mouth daily.   HYDROcodone-homatropine (HYCODAN) 5-1.5 MG/5ML syrup Take 5 mLs by mouth every 8 (eight) hours as needed for cough.   lisinopril (ZESTRIL) 10 MG tablet TAKE 1 TABLET BY MOUTH EVERY DAY   metoprolol tartrate (LOPRESSOR) 25 MG tablet TAKE 1/2 TABLET BY MOUTH TWICE A DAY   nitroGLYCERIN (NITROSTAT) 0.4 MG SL tablet Place 1 tablet (0.4 mg total) under the tongue every 5 (five) minutes as needed for chest pain.   pravastatin (PRAVACHOL) 20 MG tablet TAKE 1 TABLET (20 MG TOTAL) BY MOUTH DAILY. PLEASE SCHEDULE APPOINTMENT FOR FUTURE REFILLS.   tamsulosin (FLOMAX) 0.4 MG CAPS capsule Take 0.4 mg by mouth daily.   triamterene-hydrochlorothiazide (MAXZIDE) 75-50 MG tablet TAKE 1/2 TABLET BY MOUTH EVERY OTHER DAY     Allergies:   Codeine   Social History   Socioeconomic History   Marital status: Married    Spouse name: Not on file   Number of children: Not on file   Years of education: Not on file   Highest education level: Not on file  Occupational History   Not on file  Tobacco Use   Smoking status: Never  Smokeless tobacco: Never  Substance and Sexual Activity   Alcohol use: No   Drug use: No   Sexual activity: Not on file  Other Topics Concern   Not on file  Social History Narrative   Not on file   Social Determinants of Health   Financial Resource Strain: Not on file  Food Insecurity: Not on file  Transportation Needs: Not on file  Physical Activity: Not on file  Stress: Not on file  Social Connections: Not on file     Family History:  The patient's  family history includes CAD in his mother; Colon cancer in his father; Heart attack in his mother; Rheum arthritis in his maternal grandfather.   ROS:   Please see the history of present illness.    ROS All other systems reviewed and are negative.   PHYSICAL  EXAM:   VS:  BP 124/66   Pulse 84   Ht 5\' 7"  (1.702 m)   Wt 188 lb 12.8 oz (85.6 kg)   SpO2 92%   BMI 29.57 kg/m   Physical Exam  GEN: Well nourished, well developed, in no acute distress  Neck: no JVD, carotid bruits, or masses Cardiac:RRR; no murmurs, rubs, or gallops  Respiratory:  clear to auscultation bilaterally, normal work of breathing GI: soft, nontender, nondistended, + BS Ext: without cyanosis, clubbing, or edema, Good distal pulses bilaterally Neuro:  Alert and Oriented x 3, Psych: euthymic mood, full affect  Wt Readings from Last 3 Encounters:  06/18/21 188 lb 12.8 oz (85.6 kg)  06/04/21 188 lb (85.3 kg)  12/11/20 192 lb 9.6 oz (87.4 kg)      Studies/Labs Reviewed:   EKG:  EKG is  ordered today.  The ekg ordered today demonstrates normal sinus rhythm, left anterior fascicular block unchanged  Recent Labs: 08/07/2020: ALT 21 09/25/2020: BUN 10; Hemoglobin 15.2; Platelets 176; Potassium 3.7; Sodium 139 12/18/2020: Creatinine, Ser 0.80   Lipid Panel    Component Value Date/Time   CHOL 161 04/10/2016 0951   TRIG 107 04/10/2016 0951   HDL 52 04/10/2016 0951   CHOLHDL 3.1 04/10/2016 0951   VLDL 21 04/10/2016 0951   LDLCALC 88 04/10/2016 0951    Additional studies/ records that were reviewed today include:  NST 2014 false positive exercise stress test but no ischemia on Myoview LVEF 61%   Risk Assessment/Calculations:         ASSESSMENT:    1. Preoperative clearance   2. Coronary artery disease involving native coronary artery of native heart without angina pectoris   3. Ischemic cardiomyopathy   4. Essential hypertension   5. Lower extremity edema   6. Atrial tachycardia (HCC)      PLAN:  In order of problems listed above:  Preop clearance for right cheek reconstruction with possible adjacent tissue transfer and possible application of skin substitute by Dr. Mingo Amber 06/23/2021.  Patient is at increased risk of surgery with history of CAD and  prior ischemic cardiomyopathy.  Prior MI in 2011 no ischemia on Myoview in 2014 EF 61% at that time.  He is asymptomatic from a cardiac standpoint other than some leg swelling that is most likely related to his diet.  His METs are over 5.  We will allow him to proceed with surgery without any ischemic work-up.  He does need a 2D echo because of leg swelling but this cannot be done prior to surgery on Monday.  Dr. Irish Lack said he can hold his Plavix for 5 days  prior to surgery which begins tomorrow.   According to the Revised Cardiac Risk Index (RCRI), his Perioperative Risk of Major Cardiac Event is (%): 6.6  His Functional Capacity in METs is: 5.07 according to the [facility] Activity Status Index (DASI).   CAD status post anterior STEMI 2011 treated with several LAD stents-no ischemia on Myoview 2014 EF 61%-no angina on plavix, metoprolol, pravachol, lisinopril  Ischemic cardiomyopathy EF 61% on Myoview 2014-f/u echo  Hypertension blood pressure controlled  Lower extremity edema patient lives alone and eating a lot of frozen meals and canned foods.  He takes Maxide half a tablet every other day.  2 g sodium diet discussed.  We will check 2D echo.  Cannot be done before surgery as scheduled on Monday  History of atrial tachycardia no recent symptoms  Severe constipation patient is very comfortable and asking if we can help.  Told him to try to get to the pharmacy for some suppositories and/or enemas and contact his PCP  Shared Decision Making/Informed Consent        Medication Adjustments/Labs and Tests Ordered: Current medicines are reviewed at length with the patient today.  Concerns regarding medicines are outlined above.  Medication changes, Labs and Tests ordered today are listed in the Patient Instructions below. Patient Instructions  Medication Instructions:  Your physician recommends that you continue on your current medications as directed. Please refer to the Current Medication list  given to you today.  HOLD PLAVIX UNTIL AFTER SURGERY ON 12/5 THEN RESTART!  *If you need a refill on your cardiac medications before your next appointment, please call your pharmacy*   Lab Work: TODAY: CMET, TSH, CBC  If you have labs (blood work) drawn today and your tests are completely normal, you will receive your results only by: [place] (if you have MyChart) OR A paper copy in the mail If you have any lab test that is abnormal or we need to change your treatment, we will call you to review the results.   Testing/Procedures: Your physician has requested that you have an echocardiogram. Echocardiography is a painless test that uses sound waves to create images of your heart. It provides your doctor with information about the size and shape of your heart and how well your heart's chambers and valves are working. This procedure takes approximately one hour. There are no restrictions for this procedure.   Follow-Up: At [facility], you and your health needs are our priority.  As part of our continuing mission to provide you with exceptional heart care, we have created designated Provider Care Teams.  These Care Teams include your primary Cardiologist (physician) and Advanced Practice Providers (APPs -  Physician Assistants and Nurse Practitioners) who all work together to provide you with the care you need, when you need it.  We recommend signing up for the patient portal called "MyChart".  Sign up information is provided on this After Visit Summary.  MyChart is used to connect with patients for Virtual Visits (Telemedicine).  Patients are able to view lab/test results, encounter notes, upcoming appointments, etc.  Non-urgent messages can be sent to your provider as well.   To learn more about what you can do with MyChart, go to [email or web address].    Your next appointment:   6 month(s)  The format for your next appointment:   In Person  Provider:   Larae Grooms, MD    Other Instructions Two Gram Sodium Diet 2000 mg  What is Sodium? Sodium is a  mineral found naturally in many foods. The most significant source of sodium in the diet is table salt, which is about 40% sodium.  Processed, convenience, and preserved foods also contain a large amount of sodium.  The body needs only 500 mg of sodium daily to function,  A normal diet provides more than enough sodium even if you do not use salt.  Why Limit Sodium? A build up of sodium in the body can cause thirst, increased blood pressure, shortness of breath, and water retention.  Decreasing sodium in the diet can reduce edema and risk of heart attack or stroke associated with high blood pressure.  Keep in mind that there are many other factors involved in these health problems.  Heredity, obesity, lack of exercise, cigarette smoking, stress and what you eat all play a role.  General Guidelines: Do not add salt at the table or in cooking.  One teaspoon of salt contains over 2 grams of sodium. Read food labels Avoid processed and convenience foods Ask your dietitian before eating any foods not dicussed in the menu planning guidelines Consult your physician if you wish to use a salt substitute or a sodium containing medication such as antacids.  Limit milk and milk products to 16 oz (2 cups) per day.  Shopping Hints: READ LABELS!! "Dietetic" does not necessarily mean low sodium. Salt and other sodium ingredients are often added to foods during processing.    Menu Planning Guidelines Food Group Choose More Often Avoid  Beverages (see also the milk group All fruit juices, low-sodium, salt-free vegetables juices, low-sodium carbonated beverages Regular vegetable or tomato juices, commercially softened water used for drinking or cooking  Breads and Cereals Enriched white, wheat, rye and pumpernickel bread, hard rolls and dinner rolls; muffins, cornbread and waffles; most dry cereals, cooked cereal  without added salt; unsalted crackers and breadsticks; low sodium or homemade bread crumbs Bread, rolls and crackers with salted tops; quick breads; instant hot cereals; pancakes; commercial bread stuffing; self-rising flower and biscuit mixes; regular bread crumbs or cracker crumbs  Desserts and Sweets Desserts and sweets mad with mild should be within allowance Instant pudding mixes and cake mixes  Fats Butter or margarine; vegetable oils; unsalted salad dressings, regular salad dressings limited to 1 Tbs; light, sour and heavy cream Regular salad dressings containing bacon fat, bacon bits, and salt pork; snack dips made with instant soup mixes or processed cheese; salted nuts  Fruits Most fresh, frozen and canned fruits Fruits processed with salt or sodium-containing ingredient (some dried fruits are processed with sodium sulfites        Vegetables Fresh, frozen vegetables and low- sodium canned vegetables Regular canned vegetables, sauerkraut, pickled vegetables, and others prepared in brine; frozen vegetables in sauces; vegetables seasoned with ham, bacon or salt pork  Condiments, Sauces, Miscellaneous  Salt substitute with physician's approval; pepper, herbs, spices; vinegar, lemon or lime juice; hot pepper sauce; garlic powder, onion powder, low sodium soy sauce (1 Tbs.); low sodium condiments (ketchup, chili sauce, mustard) in limited amounts (1 tsp.) fresh ground horseradish; unsalted tortilla chips, pretzels, potato chips, popcorn, salsa (1/4 cup) Any seasoning made with salt including garlic salt, celery salt, onion salt, and seasoned salt; sea salt, rock salt, kosher salt; meat tenderizers; monosodium glutamate; mustard, regular soy sauce, barbecue, sauce, chili sauce, teriyaki sauce, steak sauce, Worcestershire sauce, and most flavored vinegars; canned gravy and mixes; regular condiments; salted snack foods, olives, picles, relish, horseradish sauce, catsup   Food preparation: Try these  seasonings  Meats:    Pork Sage, onion Serve with applesauce  Chicken Poultry seasoning, thyme, parsley Serve with cranberry sauce  Lamb Curry powder, rosemary, garlic, thyme Serve with mint sauce or jelly  Veal Marjoram, basil Serve with current jelly, cranberry sauce  Beef Pepper, bay leaf Serve with dry mustard, unsalted chive butter  Fish [place] leaf, dill Serve with unsalted lemon butter, unsalted parsley butter  Vegetables:    Asparagus Lemon juice   Broccoli Lemon juice   Carrots Mustard dressing parsley, mint, nutmeg, glazed with unsalted butter and sugar   Green beans Marjoram, lemon juice, nutmeg,dill seed   Tomatoes Basil, marjoram, onion   Spice /blend for [employer]" 4 tsp ground thyme 1 tsp ground sage 3 tsp ground rosemary 4 tsp ground marjoram   Test your knowledge A product that says "Salt Free" may still contain sodium. True or False Garlic Powder and Hot Pepper Sauce an be used as alternative seasonings.True or False Processed foods have more sodium than fresh foods.  True or False Canned Vegetables have less sodium than froze True or False   WAYS TO DECREASE YOUR SODIUM INTAKE Avoid the use of added salt in cooking and at the table.  Table salt (and other prepared seasonings which contain salt) is probably one of the greatest sources of sodium in the diet.  Unsalted foods can gain flavor from the sweet, sour, and butter taste sensations of herbs and spices.  Instead of using salt for seasoning, try the following seasonings with the foods listed.  Remember: how you use them to enhance natural food flavors is limited only by your creativity... Allspice-Meat, fish, eggs, fruit, peas, red and yellow vegetables Almond Extract-Fruit baked goods Anise Seed-Sweet breads, fruit, carrots, beets, cottage cheese, cookies (tastes like licorice) Basil-Meat, fish, eggs, vegetables, rice, vegetables salads, soups, sauces Bay Leaf-Meat, fish, stews, poultry Burnet-Salad, vegetables  (cucumber-like flavor) Caraway Seed-Bread, cookies, cottage cheese, meat, vegetables, cheese, rice Cardamon-Baked goods, fruit, soups Celery Powder or seed-Salads, salad dressings, sauces, meatloaf, soup, bread.Do not use  celery salt Chervil-Meats, salads, fish, eggs, vegetables, cottage cheese (parsley-like flavor) Chili Power-Meatloaf, chicken cheese, corn, eggplant, egg dishes Chives-Salads cottage cheese, egg dishes, soups, vegetables, sauces Cilantro-Salsa, casseroles Cinnamon-Baked goods, fruit, pork, lamb, chicken, carrots Cloves-Fruit, baked goods, fish, pot roast, green beans, beets, carrots Coriander-Pastry, cookies, meat, salads, cheese (lemon-orange flavor) Cumin-Meatloaf, fish,cheese, eggs, cabbage,fruit pie (caraway flavor) [employer], fruit, eggs, fish, poultry, cottage cheese, vegetables Dill Seed-Meat, cottage cheese, poultry, vegetables, fish, salads, bread Fennel Seed-Bread, cookies, apples, pork, eggs, fish, beets, cabbage, cheese, Licorice-like flavor Garlic-(buds or powder) Salads, meat, poultry, fish, bread, butter, vegetables, potatoes.Do not  use garlic salt Ginger-Fruit, vegetables, baked goods, meat, fish, poultry Horseradish Root-Meet, vegetables, butter Lemon Juice or Extract-Vegetables, fruit, tea, baked goods, fish salads Mace-Baked goods fruit, vegetables, fish, poultry (taste like nutmeg) Maple Extract-Syrups Marjoram-Meat, chicken, fish, vegetables, breads, green salads (taste like Sage) Mint-Tea, lamb, sherbet, vegetables, desserts, carrots, cabbage Mustard, Dry or Seed-Cheese, eggs, meats, vegetables, poultry Nutmeg-Baked goods, fruit, chicken, eggs, vegetables, desserts Onion Powder-Meat, fish, poultry, vegetables, cheese, eggs, bread, rice salads (Do not use   Onion salt) Orange Extract-Desserts, baked goods Oregano-Pasta, eggs, cheese, onions, pork, lamb, fish, chicken, vegetables, green salads Paprika-Meat, fish, poultry, eggs, cheese,  vegetables Parsley Flakes-Butter, vegetables, meat fish, poultry, eggs, bread, salads (certain forms may   Contain sodium Pepper-Meat fish, poultry, vegetables, eggs Peppermint Extract-Desserts, baked goods Poppy Seed-Eggs, bread, cheese, fruit dressings, baked goods, noodles, vegetables, cottage  [employer], Sales executive,  meat, fish, cauliflower, turnips,eggs bread Saffron-Rice, bread, veal, chicken, fish, eggs Sage-Meat, fish, poultry, onions, eggplant, tomateos, pork, stews Savory-Eggs, salads, poultry, meat, rice, vegetables, soups, pork Tarragon-Meat, poultry, fish, eggs, butter, vegetables (licorice-like flavor)  Thyme-Meat, poultry, fish, eggs, vegetables, (clover-like flavor), sauces, soups Tumeric-Salads, butter, eggs, fish, rice, vegetables (saffron-like flavor) Vanilla Extract-Baked goods, candy Vinegar-Salads, vegetables, meat marinades Walnut Extract-baked goods, candy   2. Choose your Foods Wisely   The following is a list of foods to avoid which are high in sodium:  Meats-Avoid all smoked, canned, salt cured, dried and kosher meat and fish as well as Anchovies   [employer] meats:Bologna, Liverwurst, Pastrami Canned meat or fish  Marinated herring Caviar    Pepperoni Corned Beef   Pizza Dried chipped beef  Salami Frozen breaded fish or meat Salt pork Frankfurters or hot dogs  Sardines Gefilte fish   Sausage Ham (boiled ham, Proscuitto Smoked butt    spiced ham)   Spam      TV Dinners Vegetables Canned vegetables (Regular) Relish Canned mushrooms  Sauerkraut Olives    Tomato juice Pickles  Bakery and Dessert Products Canned puddings  Cream pies Cheesecake   Decorated cakes Cookies  Beverages/Juices Tomato juice, regular  Gatorade   V-8 vegetable juice, regular  Breads and Cereals Biscuit mixes   Salted potato chips, corn chips, pretzels Bread stuffing mixes  Salted crackers and rolls Pancake and waffle  mixes Self-rising flour  Seasonings Accent    Meat sauces Barbecue sauce  Meat tenderizer Catsup    Monosodium glutamate (MSG) Celery salt   Onion salt Chili sauce   Prepared mustard Garlic salt   Salt, seasoned salt, sea salt Gravy mixes   Soy sauce Horseradish   Steak sauce Ketchup   Tartar sauce Lite salt    Teriyaki sauce Marinade mixes   Worcestershire sauce  Others Baking powder   Cocoa and cocoa mixes Baking soda   Commercial casserole mixes Candy-caramels, chocolate  Dehydrated soups    Bars, fudge,nougats  Instant rice and pasta mixes Canned broth or soup  Maraschino cherries Cheese, aged and processed cheese and cheese spreads  Learning Assessment Quiz  Indicated T (for True) or F (for False) for each of the following statements:  _____ Fresh fruits and vegetables and unprocessed grains are generally low in sodium _____ Water may contain a considerable amount of sodium, depending on the source _____ You can always tell if a food is high in sodium by tasting it _____ Certain laxatives my be high in sodium and should be avoided unless prescribed   by a physician or pharmacist _____ Salt substitutes may be used freely by anyone on a sodium restricted diet _____ Sodium is present in table salt, food additives and as a natural component of   most foods _____ Table salt is approximately 90% sodium _____ Limiting sodium intake may help prevent excess fluid accumulation in the body _____ On a sodium-restricted diet, seasonings such as bouillon soy sauce, and    cooking wine should be used in place of table salt _____ On an ingredient list, a product which lists monosodium glutamate as the first   ingredient is an appropriate food to include on a low sodium diet  Circle the best answer(s) to the following statements (Hint: there may be more than one correct answer)  11. On a low-sodium diet, some acceptable snack items are:    A. Olives  F. Bean dip   K. Grapefruit  juice    B. Salted Pretzels G. Commercial Popcorn   L. Canned peaches    C. Carrot Sticks  H. Bouillon   M. Unsalted nuts   D. [place] fries  I. Peanut butter crackers N. Salami   E. Sweet pickles J. Tomato Juice   O. Pizza  12.  Seasonings that may be used freely on a reduced - sodium diet include   A. Lemon wedges F.Monosodium glutamate K. Celery seed    B.Soysauce   G. Pepper   L. Mustard powder   C. Sea salt  H. Cooking wine  M. Onion flakes   D. Vinegar  E. Prepared horseradish N. Salsa   E. Sage   J. Worcestershire sauce  O. [address]     Sumner Boast, PA-C  06/18/2021 12:32 PM    [facility] [place], [place], [place]  [address] Phone: [phone]; Fax: [phone]

## 2021-06-18 ENCOUNTER — Ambulatory Visit (INDEPENDENT_AMBULATORY_CARE_PROVIDER_SITE_OTHER): Payer: Medicare (Managed Care) | Admitting: Physician Assistant

## 2021-06-18 ENCOUNTER — Encounter: Payer: Self-pay | Admitting: Physician Assistant

## 2021-06-18 ENCOUNTER — Other Ambulatory Visit: Payer: Self-pay

## 2021-06-18 VITALS — BP 124/66 | HR 84 | Ht 67.0 in | Wt 188.8 lb

## 2021-06-18 DIAGNOSIS — I10 Essential (primary) hypertension: Secondary | ICD-10-CM

## 2021-06-18 DIAGNOSIS — I255 Ischemic cardiomyopathy: Secondary | ICD-10-CM

## 2021-06-18 DIAGNOSIS — R600 Localized edema: Secondary | ICD-10-CM

## 2021-06-18 DIAGNOSIS — I2510 Atherosclerotic heart disease of native coronary artery without angina pectoris: Secondary | ICD-10-CM | POA: Diagnosis not present

## 2021-06-18 DIAGNOSIS — I471 Supraventricular tachycardia: Secondary | ICD-10-CM

## 2021-06-18 DIAGNOSIS — Z01818 Encounter for other preprocedural examination: Secondary | ICD-10-CM

## 2021-06-18 LAB — COMPREHENSIVE METABOLIC PANEL
ALT: 26 IU/L (ref 0–44)
AST: 31 IU/L (ref 0–40)
Albumin/Globulin Ratio: 2.6 — ABNORMAL HIGH (ref 1.2–2.2)
Albumin: 4.7 g/dL — ABNORMAL HIGH (ref 3.6–4.6)
Alkaline Phosphatase: 83 IU/L (ref 44–121)
BUN/Creatinine Ratio: 13 (ref 10–24)
BUN: 13 mg/dL (ref 8–27)
Bilirubin Total: 1.3 mg/dL — ABNORMAL HIGH (ref 0.0–1.2)
CO2: 30 mmol/L — ABNORMAL HIGH (ref 20–29)
Calcium: 9.2 mg/dL (ref 8.6–10.2)
Chloride: 95 mmol/L — ABNORMAL LOW (ref 96–106)
Creatinine, Ser: 1.03 mg/dL (ref 0.76–1.27)
Globulin, Total: 1.8 g/dL (ref 1.5–4.5)
Glucose: 115 mg/dL — ABNORMAL HIGH (ref 70–99)
Potassium: 3.7 mmol/L (ref 3.5–5.2)
Sodium: 138 mmol/L (ref 134–144)
Total Protein: 6.5 g/dL (ref 6.0–8.5)
eGFR: 72 mL/min/{1.73_m2} (ref >59)

## 2021-06-18 LAB — CBC
Hematocrit: 42.6 % (ref 37.5–51.0)
Hemoglobin: 14.8 g/dL (ref 13.0–17.7)
MCH: 30.3 pg (ref 26.6–33.0)
MCHC: 34.7 g/dL (ref 31.5–35.7)
MCV: 87 fL (ref 79–97)
Platelets: 173 10*3/uL (ref 150–450)
RBC: 4.88 x10E6/uL (ref 4.14–5.80)
RDW: 12.6 % (ref 11.6–15.4)
WBC: 5.3 10*3/uL (ref 3.4–10.8)

## 2021-06-18 LAB — TSH: TSH: 2.27 u[IU]/mL (ref 0.450–4.500)

## 2021-06-18 NOTE — Patient Instructions (Signed)
Medication Instructions:  Your physician recommends that you continue on your current medications as directed. Please refer to the Current Medication list given to you today.  HOLD PLAVIX UNTIL AFTER SURGERY ON 12/5 THEN RESTART!  *If you need a refill on your cardiac medications before your next appointment, please call your pharmacy*   Lab Work: TODAY: CMET, TSH, CBC  If you have labs (blood work) drawn today and your tests are completely normal, you will receive your results only by: [place] (if you have MyChart) OR A paper copy in the mail If you have any lab test that is abnormal or we need to change your treatment, we will call you to review the results.   Testing/Procedures: Your physician has requested that you have an echocardiogram. Echocardiography is a painless test that uses sound waves to create images of your heart. It provides your doctor with information about the size and shape of your heart and how well your heart's chambers and valves are working. This procedure takes approximately one hour. There are no restrictions for this procedure.   Follow-Up: At [facility], you and your health needs are our priority.  As part of our continuing mission to provide you with exceptional heart care, we have created designated Provider Care Teams.  These Care Teams include your primary Cardiologist (physician) and Advanced Practice Providers (APPs -  Physician Assistants and Nurse Practitioners) who all work together to provide you with the care you need, when you need it.  We recommend signing up for the patient portal called "MyChart".  Sign up information is provided on this After Visit Summary.  MyChart is used to connect with patients for Virtual Visits (Telemedicine).  Patients are able to view lab/test results, encounter notes, upcoming appointments, etc.  Non-urgent messages can be sent to your provider as well.   To learn more about what you can do with MyChart, go  to [email or web address].    Your next appointment:   6 month(s)  The format for your next appointment:   In Person  Provider:   Larae Grooms, MD    Other Instructions Two Gram Sodium Diet 2000 mg  What is Sodium? Sodium is a mineral found naturally in many foods. The most significant source of sodium in the diet is table salt, which is about 40% sodium.  Processed, convenience, and preserved foods also contain a large amount of sodium.  The body needs only 500 mg of sodium daily to function,  A normal diet provides more than enough sodium even if you do not use salt.  Why Limit Sodium? A build up of sodium in the body can cause thirst, increased blood pressure, shortness of breath, and water retention.  Decreasing sodium in the diet can reduce edema and risk of heart attack or stroke associated with high blood pressure.  Keep in mind that there are many other factors involved in these health problems.  Heredity, obesity, lack of exercise, cigarette smoking, stress and what you eat all play a role.  General Guidelines: Do not add salt at the table or in cooking.  One teaspoon of salt contains over 2 grams of sodium. Read food labels Avoid processed and convenience foods Ask your dietitian before eating any foods not dicussed in the menu planning guidelines Consult your physician if you wish to use a salt substitute or a sodium containing medication such as antacids.  Limit milk and milk products to 16 oz (2 cups) per day.  Shopping Hints: READ  LABELS!! "Dietetic" does not necessarily mean low sodium. Salt and other sodium ingredients are often added to foods during processing.    Menu Planning Guidelines Food Group Choose More Often Avoid  Beverages (see also the milk group All fruit juices, low-sodium, salt-free vegetables juices, low-sodium carbonated beverages Regular vegetable or tomato juices, commercially softened water used for drinking or cooking  Breads and Cereals  Enriched white, wheat, rye and pumpernickel bread, hard rolls and dinner rolls; muffins, cornbread and waffles; most dry cereals, cooked cereal without added salt; unsalted crackers and breadsticks; low sodium or homemade bread crumbs Bread, rolls and crackers with salted tops; quick breads; instant hot cereals; pancakes; commercial bread stuffing; self-rising flower and biscuit mixes; regular bread crumbs or cracker crumbs  Desserts and Sweets Desserts and sweets mad with mild should be within allowance Instant pudding mixes and cake mixes  Fats Butter or margarine; vegetable oils; unsalted salad dressings, regular salad dressings limited to 1 Tbs; light, sour and heavy cream Regular salad dressings containing bacon fat, bacon bits, and salt pork; snack dips made with instant soup mixes or processed cheese; salted nuts  Fruits Most fresh, frozen and canned fruits Fruits processed with salt or sodium-containing ingredient (some dried fruits are processed with sodium sulfites        Vegetables Fresh, frozen vegetables and low- sodium canned vegetables Regular canned vegetables, sauerkraut, pickled vegetables, and others prepared in brine; frozen vegetables in sauces; vegetables seasoned with ham, bacon or salt pork  Condiments, Sauces, Miscellaneous  Salt substitute with physician's approval; pepper, herbs, spices; vinegar, lemon or lime juice; hot pepper sauce; garlic powder, onion powder, low sodium soy sauce (1 Tbs.); low sodium condiments (ketchup, chili sauce, mustard) in limited amounts (1 tsp.) fresh ground horseradish; unsalted tortilla chips, pretzels, potato chips, popcorn, salsa (1/4 cup) Any seasoning made with salt including garlic salt, celery salt, onion salt, and seasoned salt; sea salt, rock salt, kosher salt; meat tenderizers; monosodium glutamate; mustard, regular soy sauce, barbecue, sauce, chili sauce, teriyaki sauce, steak sauce, Worcestershire sauce, and most flavored vinegars;  canned gravy and mixes; regular condiments; salted snack foods, olives, picles, relish, horseradish sauce, catsup   Food preparation: Try these seasonings Meats:    Pork Sage, onion Serve with applesauce  Chicken Poultry seasoning, thyme, parsley Serve with cranberry sauce  Lamb Curry powder, rosemary, garlic, thyme Serve with mint sauce or jelly  Veal Marjoram, basil Serve with current jelly, cranberry sauce  Beef Pepper, bay leaf Serve with dry mustard, unsalted chive butter  Fish [place] leaf, dill Serve with unsalted lemon butter, unsalted parsley butter  Vegetables:    Asparagus Lemon juice   Broccoli Lemon juice   Carrots Mustard dressing parsley, mint, nutmeg, glazed with unsalted butter and sugar   Green beans Marjoram, lemon juice, nutmeg,dill seed   Tomatoes Basil, marjoram, onion   Spice /blend for [employer]" 4 tsp ground thyme 1 tsp ground sage 3 tsp ground rosemary 4 tsp ground marjoram   Test your knowledge A product that says "Salt Free" may still contain sodium. True or False Garlic Powder and Hot Pepper Sauce an be used as alternative seasonings.True or False Processed foods have more sodium than fresh foods.  True or False Canned Vegetables have less sodium than froze True or False   WAYS TO DECREASE YOUR SODIUM INTAKE Avoid the use of added salt in cooking and at the table.  Table salt (and other prepared seasonings which contain salt) is probably one of the greatest  sources of sodium in the diet.  Unsalted foods can gain flavor from the sweet, sour, and butter taste sensations of herbs and spices.  Instead of using salt for seasoning, try the following seasonings with the foods listed.  Remember: how you use them to enhance natural food flavors is limited only by your creativity... Allspice-Meat, fish, eggs, fruit, peas, red and yellow vegetables Almond Extract-Fruit baked goods Anise Seed-Sweet breads, fruit, carrots, beets, cottage cheese, cookies (tastes like  licorice) Basil-Meat, fish, eggs, vegetables, rice, vegetables salads, soups, sauces Bay Leaf-Meat, fish, stews, poultry Burnet-Salad, vegetables (cucumber-like flavor) Caraway Seed-Bread, cookies, cottage cheese, meat, vegetables, cheese, rice Cardamon-Baked goods, fruit, soups Celery Powder or seed-Salads, salad dressings, sauces, meatloaf, soup, bread.Do not use  celery salt Chervil-Meats, salads, fish, eggs, vegetables, cottage cheese (parsley-like flavor) Chili Power-Meatloaf, chicken cheese, corn, eggplant, egg dishes Chives-Salads cottage cheese, egg dishes, soups, vegetables, sauces Cilantro-Salsa, casseroles Cinnamon-Baked goods, fruit, pork, lamb, chicken, carrots Cloves-Fruit, baked goods, fish, pot roast, green beans, beets, carrots Coriander-Pastry, cookies, meat, salads, cheese (lemon-orange flavor) Cumin-Meatloaf, fish,cheese, eggs, cabbage,fruit pie (caraway flavor) [employer], fruit, eggs, fish, poultry, cottage cheese, vegetables Dill Seed-Meat, cottage cheese, poultry, vegetables, fish, salads, bread Fennel Seed-Bread, cookies, apples, pork, eggs, fish, beets, cabbage, cheese, Licorice-like flavor Garlic-(buds or powder) Salads, meat, poultry, fish, bread, butter, vegetables, potatoes.Do not  use garlic salt Ginger-Fruit, vegetables, baked goods, meat, fish, poultry Horseradish Root-Meet, vegetables, butter Lemon Juice or Extract-Vegetables, fruit, tea, baked goods, fish salads Mace-Baked goods fruit, vegetables, fish, poultry (taste like nutmeg) Maple Extract-Syrups Marjoram-Meat, chicken, fish, vegetables, breads, green salads (taste like Sage) Mint-Tea, lamb, sherbet, vegetables, desserts, carrots, cabbage Mustard, Dry or Seed-Cheese, eggs, meats, vegetables, poultry Nutmeg-Baked goods, fruit, chicken, eggs, vegetables, desserts Onion Powder-Meat, fish, poultry, vegetables, cheese, eggs, bread, rice salads (Do not use   Onion salt) Orange Extract-Desserts,  baked goods Oregano-Pasta, eggs, cheese, onions, pork, lamb, fish, chicken, vegetables, green salads Paprika-Meat, fish, poultry, eggs, cheese, vegetables Parsley Flakes-Butter, vegetables, meat fish, poultry, eggs, bread, salads (certain forms may   Contain sodium Pepper-Meat fish, poultry, vegetables, eggs Peppermint Extract-Desserts, baked goods Poppy Seed-Eggs, bread, cheese, fruit dressings, baked goods, noodles, vegetables, cottage  [employer], poultry, meat, fish, cauliflower, turnips,eggs bread Saffron-Rice, bread, veal, chicken, fish, eggs Sage-Meat, fish, poultry, onions, eggplant, tomateos, pork, stews Savory-Eggs, salads, poultry, meat, rice, vegetables, soups, pork Tarragon-Meat, poultry, fish, eggs, butter, vegetables (licorice-like flavor)  Thyme-Meat, poultry, fish, eggs, vegetables, (clover-like flavor), sauces, soups Tumeric-Salads, butter, eggs, fish, rice, vegetables (saffron-like flavor) Vanilla Extract-Baked goods, candy Vinegar-Salads, vegetables, meat marinades Walnut Extract-baked goods, candy   2. Choose your Foods Wisely   The following is a list of foods to avoid which are high in sodium:  Meats-Avoid all smoked, canned, salt cured, dried and kosher meat and fish as well as Anchovies   [employer] meats:Bologna, Liverwurst, Pastrami Canned meat or fish  Marinated herring Caviar    Pepperoni Corned Beef   Pizza Dried chipped beef  Salami Frozen breaded fish or meat Salt pork Frankfurters or hot dogs  Sardines Gefilte fish   Sausage Ham (boiled ham, Proscuitto Smoked butt    spiced ham)   Spam      TV Dinners Vegetables Canned vegetables (Regular) Relish Canned mushrooms  Sauerkraut Olives    Tomato juice Pickles  Bakery and Dessert Products Canned puddings  Cream pies Cheesecake   Decorated cakes Cookies  Beverages/Juices Tomato juice, regular  Gatorade   V-8 vegetable juice,  regular  Breads and Cereals Biscuit mixes   Salted potato chips, corn chips, pretzels Bread stuffing mixes  Salted crackers and rolls Pancake and waffle mixes Self-rising flour  Seasonings Accent    Meat sauces Barbecue sauce  Meat tenderizer Catsup    Monosodium glutamate (MSG) Celery salt   Onion salt Chili sauce   Prepared mustard Garlic salt   Salt, seasoned salt, sea salt Gravy mixes   Soy sauce Horseradish   Steak sauce Ketchup   Tartar sauce Lite salt    Teriyaki sauce Marinade mixes   Worcestershire sauce  Others Baking powder   Cocoa and cocoa mixes Baking soda   Commercial casserole mixes Candy-caramels, chocolate  Dehydrated soups    Bars, fudge,nougats  Instant rice and pasta mixes Canned broth or soup  Maraschino cherries Cheese, aged and processed cheese and cheese spreads  Learning Assessment Quiz  Indicated T (for True) or F (for False) for each of the following statements:  _____ Fresh fruits and vegetables and unprocessed grains are generally low in sodium _____ Water may contain a considerable amount of sodium, depending on the source _____ You can always tell if a food is high in sodium by tasting it _____ Certain laxatives my be high in sodium and should be avoided unless prescribed   by a physician or pharmacist _____ Salt substitutes may be used freely by anyone on a sodium restricted diet _____ Sodium is present in table salt, food additives and as a natural component of   most foods _____ Table salt is approximately 90% sodium _____ Limiting sodium intake may help prevent excess fluid accumulation in the body _____ On a sodium-restricted diet, seasonings such as bouillon soy sauce, and    cooking wine should be used in place of table salt _____ On an ingredient list, a product which lists monosodium glutamate as the first   ingredient is an appropriate food to include on a low sodium diet  Circle the best answer(s) to the following statements (Hint:  there may be more than one correct answer)  11. On a low-sodium diet, some acceptable snack items are:    A. Olives  F. Bean dip   K. Grapefruit juice    B. Salted Pretzels G. Commercial Popcorn   L. Canned peaches    C. Carrot Sticks  H. Bouillon   M. Unsalted nuts   D. [place] fries  I. Peanut butter crackers N. Salami   E. Sweet pickles J. Tomato Juice   O. Pizza  12.  Seasonings that may be used freely on a reduced - sodium diet include   A. Lemon wedges F.Monosodium glutamate K. Celery seed    B.Soysauce   G. Pepper   L. Mustard powder   C. Sea salt  H. Cooking wine  M. Onion flakes   D. Vinegar  E. Prepared horseradish N. Salsa   E. Sage   J. Worcestershire sauce  O. Chutney

## 2021-06-19 ENCOUNTER — Emergency Department (HOSPITAL_COMMUNITY): Payer: Medicare (Managed Care)

## 2021-06-19 ENCOUNTER — Emergency Department (HOSPITAL_COMMUNITY)
Admission: EM | Admit: 2021-06-19 | Discharge: 2021-06-20 | Disposition: A | Discharge status: Home / Self Care | Payer: Medicare (Managed Care) | Attending: Emergency Medicine | Admitting: Emergency Medicine

## 2021-06-19 DIAGNOSIS — Z85038 Personal history of other malignant neoplasm of large intestine: Secondary | ICD-10-CM | POA: Diagnosis not present

## 2021-06-19 DIAGNOSIS — I2510 Atherosclerotic heart disease of native coronary artery without angina pectoris: Secondary | ICD-10-CM | POA: Insufficient documentation

## 2021-06-19 DIAGNOSIS — K5900 Constipation, unspecified: Secondary | ICD-10-CM | POA: Insufficient documentation

## 2021-06-19 DIAGNOSIS — R110 Nausea: Secondary | ICD-10-CM | POA: Insufficient documentation

## 2021-06-19 DIAGNOSIS — Z79899 Other long term (current) drug therapy: Secondary | ICD-10-CM | POA: Diagnosis not present

## 2021-06-19 DIAGNOSIS — I10 Essential (primary) hypertension: Secondary | ICD-10-CM | POA: Insufficient documentation

## 2021-06-19 DIAGNOSIS — R140 Abdominal distension (gaseous): Secondary | ICD-10-CM | POA: Diagnosis present

## 2021-06-19 DIAGNOSIS — Z20822 Contact with and (suspected) exposure to covid-19: Secondary | ICD-10-CM | POA: Insufficient documentation

## 2021-06-19 DIAGNOSIS — E876 Hypokalemia: Secondary | ICD-10-CM | POA: Insufficient documentation

## 2021-06-19 LAB — CBC WITH DIFFERENTIAL/PLATELET
Abs Immature Granulocytes: 0.05 10*3/uL (ref 0.00–0.07)
Basophils Absolute: 0 10*3/uL (ref 0.0–0.1)
Basophils Relative: 0 %
Eosinophils Absolute: 0 10*3/uL (ref 0.0–0.5)
Eosinophils Relative: 0 %
HCT: 43.1 % (ref 39.0–52.0)
Hemoglobin: 15.3 g/dL (ref 13.0–17.0)
Immature Granulocytes: 1 %
Lymphocytes Relative: 6 %
Lymphs Abs: 0.5 10*3/uL — ABNORMAL LOW (ref 0.7–4.0)
MCH: 30.8 pg (ref 26.0–34.0)
MCHC: 35.5 g/dL (ref 30.0–36.0)
MCV: 86.7 fL (ref 80.0–100.0)
Monocytes Absolute: 0.5 10*3/uL (ref 0.1–1.0)
Monocytes Relative: 5 %
Neutro Abs: 7.6 10*3/uL (ref 1.7–7.7)
Neutrophils Relative %: 88 %
Platelets: 166 10*3/uL (ref 150–400)
RBC: 4.97 MIL/uL (ref 4.22–5.81)
RDW: 13.1 % (ref 11.5–15.5)
WBC: 8.7 10*3/uL (ref 4.0–10.5)
nRBC: 0 % (ref 0.0–0.2)

## 2021-06-19 LAB — URINALYSIS, ROUTINE W REFLEX MICROSCOPIC
Bilirubin Urine: NEGATIVE
Glucose, UA: NEGATIVE mg/dL
Hgb urine dipstick: NEGATIVE
Ketones, ur: 15 mg/dL — AB
Leukocytes,Ua: NEGATIVE
Nitrite: NEGATIVE
Protein, ur: NEGATIVE mg/dL
Specific Gravity, Urine: 1.01 (ref 1.005–1.030)
pH: 7 (ref 5.0–8.0)

## 2021-06-19 LAB — COMPREHENSIVE METABOLIC PANEL
ALT: 23 U/L (ref 0–44)
AST: 30 U/L (ref 15–41)
Albumin: 4.1 g/dL (ref 3.5–5.0)
Alkaline Phosphatase: 71 U/L (ref 38–126)
Anion gap: 17 — ABNORMAL HIGH (ref 5–15)
BUN: 13 mg/dL (ref 8–23)
CO2: 23 mmol/L (ref 22–32)
Calcium: 9.5 mg/dL (ref 8.9–10.3)
Chloride: 93 mmol/L — ABNORMAL LOW (ref 98–111)
Creatinine, Ser: 1.21 mg/dL (ref 0.61–1.24)
GFR, Estimated: 59 mL/min — ABNORMAL LOW (ref >60)
Glucose, Bld: 126 mg/dL — ABNORMAL HIGH (ref 70–99)
Potassium: 2.7 mmol/L — CL (ref 3.5–5.1)
Sodium: 133 mmol/L — ABNORMAL LOW (ref 135–145)
Total Bilirubin: 2.2 mg/dL — ABNORMAL HIGH (ref 0.3–1.2)
Total Protein: 6.7 g/dL (ref 6.5–8.1)

## 2021-06-19 LAB — LACTIC ACID, PLASMA
Lactic Acid, Venous: 2 mmol/L (ref 0.5–1.9)
Lactic Acid, Venous: 1.2 mmol/L (ref 0.5–1.9)

## 2021-06-19 MED ORDER — POTASSIUM CHLORIDE 10 MEQ/100ML IV SOLN
10.0000 meq | Freq: Once | INTRAVENOUS | Status: AC
  Administered 2021-06-19: 10 meq via INTRAVENOUS
  Filled 2021-06-19: qty 100

## 2021-06-19 MED ORDER — SODIUM CHLORIDE 0.9 % IV SOLN: INTRAVENOUS | Status: DC

## 2021-06-19 MED ORDER — POTASSIUM CHLORIDE CRYS ER 20 MEQ PO TBCR
60.0000 meq | EXTENDED_RELEASE_TABLET | Freq: Once | ORAL | Status: AC
  Administered 2021-06-19: 60 meq via ORAL
  Filled 2021-06-19: qty 3

## 2021-06-19 MED ORDER — POTASSIUM CHLORIDE ER 10 MEQ PO TBCR: 10.0000 meq | EXTENDED_RELEASE_TABLET | Freq: Two times a day (BID) | ORAL | 0 refills | Status: DC

## 2021-06-19 NOTE — ED Provider Notes (Signed)
[facility] EMERGENCY DEPARTMENT Provider Note   CSN: [id number] Arrival date & time: 06/19/21  1622     History Chief Complaint  Patient presents with   Abdominal Pain    Jeffrey Zimmerman is a 85 y.o. male.  85 year old male presents with 6 days of constipation.  He endorses nausea but no vomiting.  States that he is passing gas.  Has noted increased abdominal distention.  Has noted waxing and waning lower abdominal crampy pain.  No urinary symptoms.  No fever or chills.  Has been using stool softeners without relief      Past Medical History:  Diagnosis Date   Adenomatous colon polyp 2/10   Basal cell carcinoma    Cancer ([place])    colon   Coronary artery disease    04/21/10 Anter MI with BMS to LAD with residual proximal LAD EF 40%   Gout    History of cholecystectomy    History of colonoscopy    1/5 AND 2/10   Hypertension    Irritable bowel syndrome    Myocardial infarct [facility])    SVT (supraventricular tachycardia) ([place])     Patient Active Problem List   Diagnosis Date Noted   Bilateral lower extremity edema 04/10/2016   Old myocardial infarction 08/10/2013   Mixed hyperlipidemia 08/10/2013   Other malaise and fatigue 08/08/2013   Hypertension    Coronary artery disease    Cancer ([facility])    Basal cell carcinoma    Gout    Irritable bowel syndrome    SVT (supraventricular tachycardia) ([place])     Past Surgical History:  Procedure Laterality Date   APPENDECTOMY     CARDIAC CATHETERIZATION     04/21/10 Anter MI with BMS to LAD with residual proximal LAD EF 40%   KNEE ARTHOSCOPY Bilateral    PARTIAL LEFT NEPHRECTOMY     (for oncocytoma-benign)       Family History  Problem Relation Age of Onset   Heart attack Mother    CAD Mother    Colon cancer Father    Rheum arthritis Maternal Grandfather     Social History   Tobacco Use   Smoking status: Never   Smokeless tobacco: Never  Substance Use Topics   Alcohol use: No   Drug use: No     Home Medications Prior to Admission medications   Medication Sig Start Date End Date Taking? Authorizing Provider  amLODipine (NORVASC) 5 MG tablet TAKE 1 TABLET BY MOUTH DAILY Patient not taking: Reported on 06/18/2021 03/28/19   [name], MD  Cholecalciferol (VITAMIN D3) 50 MCG (2000 UT) TABS Take 2,000 Units by mouth in the morning.    [provider]  clopidogrel (PLAVIX) 75 MG tablet Take 1 tablet (75 mg total) by mouth daily. Patient not taking: Reported on 06/18/2021 07/05/20   [name], MD  fluticasone [facility] SENSIMIST) 27.5 MCG/SPRAY nasal spray Place 1 spray into the nose daily.    [provider]  lactase (LACTAID) 3000 units tablet Take 3,000-6,000 Units by mouth 3 (three) times daily with meals.    [provider]  lisinopril-hydrochlorothiazide (ZESTORETIC) 20-25 MG tablet Take 1 tablet by mouth daily.    [provider]  Loperamide-Simethicone 2-125 MG TABS Take 1 tablet by mouth 3 (three) times daily before meals. TAKE 45 MINUTES BEFORE MEALS    [provider]  metoprolol tartrate (LOPRESSOR) 25 MG tablet TAKE 1/2 TABLET BY MOUTH TWICE A DAY 10/09/20  Belva Crome, MD  nitroGLYCERIN (NITROSTAT) 0.4 MG SL tablet Place 1 tablet (0.4 mg total) under the tongue every 5 (five) minutes as needed for chest pain. Patient not taking: Reported on 06/18/2021 05/13/16   [name], MD  pravastatin (PRAVACHOL) 20 MG tablet TAKE 1 TABLET (20 MG TOTAL) BY MOUTH DAILY. PLEASE SCHEDULE APPOINTMENT FOR FUTURE REFILLS. 08/07/20   [name], MD  tamsulosin (FLOMAX) 0.4 MG CAPS capsule Take 0.8 mg by mouth at bedtime. 11/19/19   [provider]    Allergies    Codeine  Review of Systems   Review of Systems  All other systems reviewed and are negative.  Physical Exam Updated Vital Signs SpO2 96%   Physical Exam Vitals and nursing note reviewed.  Constitutional:      General: He is not  in acute distress.    Appearance: Normal appearance. He is well-developed. He is not toxic-appearing.  HENT:     Head: Normocephalic and atraumatic.  Eyes:     General: Lids are normal.     Conjunctiva/sclera: Conjunctivae normal.     Pupils: Pupils are equal, round, and reactive to light.  Neck:     Thyroid: No thyroid mass.     Trachea: No tracheal deviation.  Cardiovascular:     Rate and Rhythm: Normal rate and regular rhythm.     Heart sounds: Normal heart sounds. No murmur heard.   No gallop.  Pulmonary:     Effort: Pulmonary effort is normal. No respiratory distress.     Breath sounds: Normal breath sounds. No stridor. No decreased breath sounds, wheezing, rhonchi or rales.  Abdominal:     General: There is no distension.     Palpations: Abdomen is soft.     Tenderness: There is abdominal tenderness in the left lower quadrant. There is no rebound.  Genitourinary:    Rectum: Tenderness present.     Comments: Hard stool in rectal vault Musculoskeletal:        General: No tenderness. Normal range of motion.     Cervical back: Normal range of motion and neck supple.  Skin:    General: Skin is warm and dry.     Findings: No abrasion or rash.  Neurological:     Mental Status: He is alert and oriented to person, place, and time. Mental status is at baseline.     GCS: GCS eye subscore is 4. GCS verbal subscore is 5. GCS motor subscore is 6.     Cranial Nerves: No cranial nerve deficit.     Sensory: No sensory deficit.     Motor: Motor function is intact.  Psychiatric:        Attention and Perception: Attention normal.        Speech: Speech normal.        Behavior: Behavior normal.    ED Results / Procedures / Treatments   Labs (all labs ordered are listed, but only abnormal results are displayed) Labs Reviewed  CBC WITH DIFFERENTIAL/PLATELET  COMPREHENSIVE METABOLIC PANEL  LACTIC ACID, PLASMA    EKG None  Radiology No results found.  Procedures Procedures    Medications Ordered in ED Medications  0.9 %  sodium chloride infusion (has no administration in time range)    ED Course  I have reviewed the triage vital signs and the nursing notes.  Pertinent labs & imaging results that were available during my care of the patient were reviewed by me and considered in my medical  decision making (see chart for details).    MDM Rules/Calculators/A&P                           Patient was manually disimpacted by me with copious months of stool.  Was given an enema by nursing and feels much better.  Did bump his lactate but repeat was normal.  Had abdominal CT scan which showed distended bladder.  Foley catheter placed and leg bag given to go home with.  He feels is better at this time.  Has had prostate issues in the past.  Will give referral to urology.  Had evidence of hypokalemia which was treated with oral as well as IV potassium.  Discussed case with patient's son-in-law and patient be sent home by [facility].  [facility] Final Clinical Impression(s) / ED Diagnoses Final diagnoses:  None    Rx / DC Orders ED Discharge Orders     None        Lacretia Leigh, MD 06/19/21 2209

## 2021-06-19 NOTE — Progress Notes (Addendum)
Spoke to Salva at Dr. Keane Scrape office for consent orders to be put in. Left message for Anderson Malta at Dr. Janeice Robinson office regarding orders needed for upcoming surgery.  Cardiac clearance on 06/18/21 in Epic.

## 2021-06-19 NOTE — ED Notes (Signed)
[facility] was called 2nd in list

## 2021-06-19 NOTE — ED Notes (Signed)
[facility] called for update [phone]). Reports that patient has pre-op appointment tomorrow for surgery to remove facial cancer  on Monday 12/5.

## 2021-06-19 NOTE — Progress Notes (Signed)
Surgical Instructions    Your procedure is scheduled on Monday December 5th.  Report to [facility] Main Entrance "A" at 5:30 A.M., then check in with the Admitting office.  Call this number if you have problems the morning of surgery:  [phone]   If you have any questions prior to your surgery date call [phone]: Open Monday-Friday 8am-4pm    Remember:  Do not eat after midnight the night before your surgery  You may drink clear liquids until 4:30am the morning of your surgery.   Clear liquids allowed are: Water, Non-Citrus Juices (without pulp), Carbonated Beverages, Clear Tea, Black Coffee ONLY (NO MILK, CREAM OR POWDERED CREAMER of any kind), and Gatorade    Take these medicines the morning of surgery with A SIP OF WATER metoprolol tartrate (LOPRESSOR) 25 MG tablet pravastatin (PRAVACHOL) 20 MG tablet  IF NEEDED nitroGLYCERIN (NITROSTAT) 0.4 MG SL tablet   Per cardiology note Dr. Irish Lack said to stop Plavix 5 days prior to surgery and resume after surgery.   As of today, STOP taking any Aspirin (unless otherwise instructed by your surgeon) Aleve, Naproxen, Ibuprofen, Motrin, Advil, Goody's, BC's, all herbal medications, fish oil, and all vitamins.     After your COVID test   You are not required to quarantine however you are required to wear a well-fitting mask when you are out and around people not in your household.  If your mask becomes wet or soiled, replace with a new one.  Wash your hands often with soap and water for 20 seconds or clean your hands with an alcohol-based hand sanitizer that contains at least 60% alcohol.  Do not share personal items.  Notify your provider: if you are in close contact with someone who has COVID  or if you develop a fever of 100.4 or greater, sneezing, cough, sore throat, shortness of breath or body aches.             Do not wear jewelry  Do not wear lotions, powders, colognes, or deodorant. Do not shave 48 hours prior  to surgery.  Men may shave face and neck. Do not bring valuables to the hospital. DO Not wear nail polish, gel polish, artificial nails, or any other type of covering on natural nails including finger and toenails. If patients have artificial nails, gel coating, etc. that need to be removed by a nail salon, please have this removed prior to surgery or surgery may need to be canceled/delayed if the surgeon/ anesthesia feels like the patient is unable to be adequately monitored.             [facility] is not responsible for any belongings or valuables.  Do NOT Smoke (Tobacco/Vaping)  24 hours prior to your procedure  If you use a CPAP at night, you may bring your mask for your overnight stay.   Contacts, glasses, hearing aids, dentures or partials may not be worn into surgery, please bring cases for these belongings   For patients admitted to the hospital, discharge time will be determined by your treatment team.   Patients discharged the day of surgery will not be allowed to drive home, and someone needs to stay with them for 24 hours.  NO VISITORS WILL BE ALLOWED IN PRE-OP WHERE PATIENTS ARE PREPPED FOR SURGERY.  ONLY 1 SUPPORT PERSON MAY BE PRESENT IN THE WAITING ROOM WHILE YOU ARE IN SURGERY.  IF YOU ARE TO BE ADMITTED, ONCE YOU ARE IN YOUR ROOM YOU WILL BE ALLOWED TWO (  2) VISITORS. 1 (ONE) VISITOR MAY STAY OVERNIGHT BUT MUST ARRIVE TO THE ROOM BY 8pm.  Minor children may have two parents present. Special consideration for safety and communication needs will be reviewed on a case by case basis.  Special instructions:    Oral Hygiene is also important to reduce your risk of infection.  Remember - BRUSH YOUR TEETH THE MORNING OF SURGERY WITH YOUR REGULAR TOOTHPASTE   [facility]- Preparing For Surgery  Before surgery, you can play an important role. Because skin is not sterile, your skin needs to be as free of germs as possible. You can reduce the number of germs on your skin by washing  with CHG (chlorahexidine gluconate) Soap before surgery.  CHG is an antiseptic cleaner which kills germs and bonds with the skin to continue killing germs even after washing.     Please do not use if you have an allergy to CHG or antibacterial soaps. If your skin becomes reddened/irritated stop using the CHG.  Do not shave (including legs and underarms) for at least 48 hours prior to first CHG shower. It is OK to shave your face.  Please follow these instructions carefully.     Shower the NIGHT BEFORE SURGERY and the MORNING OF SURGERY with CHG Soap.   If you chose to wash your hair, wash your hair first as usual with your normal shampoo. After you shampoo, rinse your hair and body thoroughly to remove the shampoo.  Then [employer] and genitals (private parts) with your normal soap and rinse thoroughly to remove soap.  After that Use CHG Soap as you would any other liquid soap. You can apply CHG directly to the skin and wash gently with a scrungie or a clean washcloth.   Apply the CHG Soap to your body ONLY FROM THE NECK DOWN.  Do not use on open wounds or open sores. Avoid contact with your eyes, ears, mouth and genitals (private parts). Wash Face and genitals (private parts)  with your normal soap.   Wash thoroughly, paying special attention to the area where your surgery will be performed.  Thoroughly rinse your body with warm water from the neck down.  DO NOT shower/wash with your normal soap after using and rinsing off the CHG Soap.  Pat yourself dry with a CLEAN TOWEL.  Wear CLEAN PAJAMAS to bed the night before surgery  Place CLEAN SHEETS on your bed the night before your surgery  DO NOT SLEEP WITH PETS.   Day of Surgery:  Take a shower with CHG soap. Wear Clean/Comfortable clothing the morning of surgery Do not apply any deodorants/lotions.   Remember to brush your teeth WITH YOUR REGULAR TOOTHPASTE.   Please read over the following fact sheets that you were given.

## 2021-06-19 NOTE — Discharge Instructions (Addendum)
Follow-up with your doctor on Monday for repeat potassium.  It was 2.7 here today

## 2021-06-19 NOTE — ED Triage Notes (Signed)
Pt arrives via [facility] from home. Abdominal pain/ distention. Constipation- unknown last BM. No N/V. Aox4.

## 2021-06-20 ENCOUNTER — Encounter (HOSPITAL_COMMUNITY)
Admission: RE | Admit: 2021-06-20 | Discharge: 2021-06-20 | Disposition: A | Discharge status: Home / Self Care | Payer: Medicare (Managed Care) | Source: Ambulatory Visit | Attending: Plastic Surgery | Admitting: Plastic Surgery

## 2021-06-20 ENCOUNTER — Other Ambulatory Visit: Payer: Self-pay

## 2021-06-20 ENCOUNTER — Encounter (HOSPITAL_COMMUNITY): Payer: Self-pay

## 2021-06-20 VITALS — BP 152/81 | HR 103 | Temp 97.7°F | Resp 17 | Ht 67.0 in | Wt 183.7 lb

## 2021-06-20 DIAGNOSIS — I10 Essential (primary) hypertension: Secondary | ICD-10-CM

## 2021-06-20 DIAGNOSIS — R17 Unspecified jaundice: Secondary | ICD-10-CM

## 2021-06-20 DIAGNOSIS — Z01818 Encounter for other preprocedural examination: Secondary | ICD-10-CM

## 2021-06-20 HISTORY — DX: Unspecified osteoarthritis, unspecified site: M19.90

## 2021-06-20 LAB — COMPREHENSIVE METABOLIC PANEL
ALT: 23 U/L (ref 0–44)
AST: 34 U/L (ref 15–41)
Albumin: 4 g/dL (ref 3.5–5.0)
Alkaline Phosphatase: 66 U/L (ref 38–126)
Anion gap: 14 (ref 5–15)
BUN: 13 mg/dL (ref 8–23)
CO2: 28 mmol/L (ref 22–32)
Calcium: 9.2 mg/dL (ref 8.9–10.3)
Chloride: 96 mmol/L — ABNORMAL LOW (ref 98–111)
Creatinine, Ser: 1.12 mg/dL (ref 0.61–1.24)
GFR, Estimated: >60 mL/min (ref >60)
Glucose, Bld: 101 mg/dL — ABNORMAL HIGH (ref 70–99)
Potassium: 3.3 mmol/L — ABNORMAL LOW (ref 3.5–5.1)
Sodium: 138 mmol/L (ref 135–145)
Total Bilirubin: 1.9 mg/dL — ABNORMAL HIGH (ref 0.3–1.2)
Total Protein: 6.6 g/dL (ref 6.5–8.1)

## 2021-06-20 LAB — SARS CORONAVIRUS 2 (TAT 6-24 HRS): SARS Coronavirus 2: NEGATIVE

## 2021-06-20 NOTE — ED Notes (Signed)
PTAR here to pick up patient to take home.  Son and wife called multiple times to make sure someone was available at when they arrived.  After multiple attempts on all numbers provided, staff was unable to get ahold of family members.  Voicemail left for family to return call

## 2021-06-20 NOTE — ED Notes (Signed)
New PTAR request placed.  Will attempt to get a hold of family again

## 2021-06-20 NOTE — ED Notes (Signed)
Patient continues to wait for transportation home.  Unable to get ahold of any family members at this time.

## 2021-06-20 NOTE — Anesthesia Preprocedure Evaluation (Addendum)
Anesthesia Evaluation  Patient identified by MRN, date of birth, ID band Patient awake  General Assessment Comment: Basal Cell Carcinoma of face  Reviewed: Allergy & Precautions, NPO status , Patient's Chart, lab work & pertinent test results, reviewed documented beta blocker date and time   Airway Mallampati: II  TM Distance: >3 FB Neck ROM: Full    Dental  (+) Teeth Intact, Dental Advisory Given, Poor Dentition   Pulmonary neg pulmonary ROS,    Pulmonary exam normal breath sounds clear to auscultation       Cardiovascular hypertension, Pt. on medications and Pt. on home beta blockers + CAD, + Past MI and + Cardiac Stents (BMS to LAD)  Normal cardiovascular exam+ dysrhythmias Supra Ventricular Tachycardia  Rhythm:Regular Rate:Normal     Neuro/Psych negative neurological ROS  negative psych ROS   GI/Hepatic negative GI ROS, Neg liver ROS, H/o Colon cancer    Endo/Other  negative endocrine ROS  Renal/GU negative Renal ROSS/p partial L nephrectomy      Musculoskeletal  (+) Arthritis ,   Abdominal   Peds  Hematology  (+) Blood dyscrasia (Plavix), ,   Anesthesia Other Findings   Reproductive/Obstetrics                          Anesthesia Physical Anesthesia Plan  ASA: 3  Anesthesia Plan: General   Post-op Pain Management: Tylenol PO (pre-op)   Induction: Intravenous  PONV Risk Score and Plan: 2 and Dexamethasone, Ondansetron and Treatment may vary due to age or medical condition  Airway Management Planned: Oral ETT  Additional Equipment:   Intra-op Plan:   Post-operative Plan: Extubation in OR  Informed Consent: I have reviewed the patients History and Physical, chart, labs and discussed the procedure including the risks, benefits and alternatives for the proposed anesthesia with the patient or authorized representative who has indicated his/her understanding and acceptance.      Dental advisory given  Plan Discussed with: CRNA  Anesthesia Plan Comments: (CLEARSIGHT  PAT note by Karoline Caldwell, PA-C: Follows with cardiology for hx of CAD status post anterior STEMI 2011 treated with several LAD stents, no ischemia on Myoview 2014 EF improved to 61%, hypertension, history of atrial tachycardia. Seen by Ermalinda Barrios, PA-C 06/18/21 for preop clearance. Per note, "Preop clearance for right cheek reconstruction with possible adjacent tissue transfer and possible application of skin substitute by Dr. Willodean Rosenthal 06/23/2021.Patient is at increased risk of surgery with history of CAD and prior ischemic cardiomyopathy. Prior MI in 2011 no ischemia on Myoview in 2014 EF 61% at that time. He is asymptomatic from a cardiac standpoint other than some leg swelling that is most likely related to his diet. His METs are over 5. We will allow him to proceed with surgery without any ischemic work-up. He does need a 2D echo because of leg swelling but this cannot be done prior to surgeryon Monday. Dr. Irish Lack said he can hold his Plavix for 5 days prior to surgery which begins tomorrow. According to the Revised Cardiac Risk Index (RCRI),hisPerioperative Risk of Major Cardiac Event is (%): 6.6. HisFunctional Capacity in METs is: 5.07according to the Duke Activity Status Index (DASI)."  Patient seen at ED 06/19/2021 for complaint of constipation.  Improved with disimpaction.  CT scan also notable for distended bladder, Foley catheter placed and leg bag was given to go home with.  Labs showed hypokalemia with potassium 2.7, he was repleted with oral as well as IV potassium.  CMP rechecked at preop 12/22, potassium improved to 3.3, remainder unremarkable.  EKG 06/18/2021: Sinus rhythm.  Rate 84.  Left anterior fascicular block.  Rest/stress myocardial perfusion 02/14/13: Impression: The post stress left ventricular function is normal.  The post stress ejection fraction 61%.  Septal  hypokinesis consistent with prior MI. Exercise capacity 6 METS.  Electrocardiographically falsely positive GXT. Evidence of prior anterior MI, mild hypokinesis of distal anterior wall.  No reversible ischemia.  This was essentially a low risk scan.   )     Anesthesia Quick Evaluation

## 2021-06-20 NOTE — Progress Notes (Signed)
Anesthesia Chart Review:  Follows with cardiology for hx of CAD status post anterior STEMI 2011 treated with several LAD stents, no ischemia on Myoview 2014 EF improved to 61%, hypertension, history of atrial tachycardia. Seen by Ermalinda Barrios, PA-C 06/18/21 for preop clearance. Per note, "Preop clearance for right cheek reconstruction with possible adjacent tissue transfer and possible application of skin substitute by Dr. Mingo Amber 06/23/2021.  Patient is at increased risk of surgery with history of CAD and prior ischemic cardiomyopathy.  Prior MI in 2011 no ischemia on Myoview in 2014 EF 61% at that time.  He is asymptomatic from a cardiac standpoint other than some leg swelling that is most likely related to his diet.  His METs are over 5.  We will allow him to proceed with surgery without any ischemic work-up.  He does need a 2D echo because of leg swelling but this cannot be done prior to surgery on Monday.  Dr. Irish Lack said he can hold his Plavix for 5 days prior to surgery which begins tomorrow. According to the Revised Cardiac Risk Index (RCRI), his Perioperative Risk of Major Cardiac Event is (%): 6.6. His Functional Capacity in METs is: 5.07 according to the Duke Activity Status Index (DASI)."  Patient seen at ED 06/19/2021 for complaint of constipation.  Improved with disimpaction.  CT scan also notable for distended bladder, Foley catheter placed and leg bag was given to go home with.  Labs showed hypokalemia with potassium 2.7, he was repleted with oral as well as IV potassium.  CMP rechecked at preop 12/22, potassium improved to 3.3, remainder unremarkable.  EKG 06/18/2021: Sinus rhythm.  Rate 84.  Left anterior fascicular block.  Rest/stress myocardial perfusion 02/14/13: Impression: The post stress left ventricular function is normal.  The post stress ejection fraction 61%.  Septal hypokinesis consistent with prior MI. Exercise capacity 6 METS.  Electrocardiographically falsely  positive GXT. Evidence of prior anterior MI, mild hypokinesis of distal anterior wall.  No reversible ischemia.  This was essentially a low risk scan.    Wynonia Musty [facility] Short Stay Center/Anesthesiology Phone [phone] 06/20/2021 3:54 PM

## 2021-06-20 NOTE — ED Notes (Signed)
Patient updated on status of PTAR.  Patient states that he will be able to get into his apartment once at his place.  States that if wife or step-son are not present then an Glass blower/designer will be

## 2021-06-20 NOTE — Progress Notes (Signed)
PCP - Dr. Gaynelle Arabian Cardiologist - Dr. Larae Grooms  PPM/ICD - denies   Chest x-ray - 08/07/20 EKG - 06/18/21 Stress Test - 02/14/13 ECHO - 06/18/21 Cardiac Cath - 04/21/10  Sleep Study - denies   DM- denies  Blood Thinner Instructions: pt instructed to hold Plavix for 5 days. Pt states he hasn't taken it "in a while" Aspirin Instructions: n/a  ERAS Protcol - yes, no drink   COVID TEST- 06/20/21 at PAT   Anesthesia review: yes, cardiac hx  Patient denies shortness of breath, fever, cough and chest pain at PAT appointment   All instructions explained to the patient, with a verbal understanding of the material. Patient agrees to go over the instructions while at home for a better understanding. Patient also instructed to wear a mask in public after being tested for COVID-19. The opportunity to ask questions was provided.

## 2021-06-20 NOTE — ED Notes (Signed)
Per main office, they are willing to unlock pt apartment. PTAR leaving with patient at this time.

## 2021-06-20 NOTE — ED Notes (Signed)
Patient updated on plan of care

## 2021-06-20 NOTE — ED Notes (Signed)
PTAR unable to transport patient home at this time due to family not answering phone to verify that someone was home when they arrived with patient

## 2021-06-21 ENCOUNTER — Encounter (HOSPITAL_COMMUNITY): Payer: Self-pay | Admitting: *Deleted

## 2021-06-21 ENCOUNTER — Emergency Department (HOSPITAL_COMMUNITY)
Admission: EM | Admit: 2021-06-21 | Discharge: 2021-06-22 | Disposition: A | Discharge status: Home / Self Care | Payer: Medicare (Managed Care) | Attending: Emergency Medicine | Admitting: Emergency Medicine

## 2021-06-21 ENCOUNTER — Other Ambulatory Visit: Payer: Self-pay

## 2021-06-21 DIAGNOSIS — I10 Essential (primary) hypertension: Secondary | ICD-10-CM | POA: Insufficient documentation

## 2021-06-21 DIAGNOSIS — K5900 Constipation, unspecified: Secondary | ICD-10-CM | POA: Insufficient documentation

## 2021-06-21 DIAGNOSIS — Z85038 Personal history of other malignant neoplasm of large intestine: Secondary | ICD-10-CM | POA: Insufficient documentation

## 2021-06-21 DIAGNOSIS — Z85828 Personal history of other malignant neoplasm of skin: Secondary | ICD-10-CM | POA: Insufficient documentation

## 2021-06-21 DIAGNOSIS — I2510 Atherosclerotic heart disease of native coronary artery without angina pectoris: Secondary | ICD-10-CM | POA: Diagnosis not present

## 2021-06-21 DIAGNOSIS — Z79899 Other long term (current) drug therapy: Secondary | ICD-10-CM | POA: Diagnosis not present

## 2021-06-21 DIAGNOSIS — R109 Unspecified abdominal pain: Secondary | ICD-10-CM | POA: Diagnosis not present

## 2021-06-21 LAB — COMPREHENSIVE METABOLIC PANEL
ALT: 21 U/L (ref 0–44)
AST: 31 U/L (ref 15–41)
Albumin: 3.8 g/dL (ref 3.5–5.0)
Alkaline Phosphatase: 67 U/L (ref 38–126)
Anion gap: 12 (ref 5–15)
BUN: 16 mg/dL (ref 8–23)
CO2: 29 mmol/L (ref 22–32)
Calcium: 9.2 mg/dL (ref 8.9–10.3)
Chloride: 97 mmol/L — ABNORMAL LOW (ref 98–111)
Creatinine, Ser: 1.24 mg/dL (ref 0.61–1.24)
GFR, Estimated: 58 mL/min — ABNORMAL LOW (ref >60)
Glucose, Bld: 105 mg/dL — ABNORMAL HIGH (ref 70–99)
Potassium: 3.5 mmol/L (ref 3.5–5.1)
Sodium: 138 mmol/L (ref 135–145)
Total Bilirubin: 2.5 mg/dL — ABNORMAL HIGH (ref 0.3–1.2)
Total Protein: 6.7 g/dL (ref 6.5–8.1)

## 2021-06-21 LAB — CBC WITH DIFFERENTIAL/PLATELET
Abs Immature Granulocytes: 0.05 10*3/uL (ref 0.00–0.07)
Basophils Absolute: 0 10*3/uL (ref 0.0–0.1)
Basophils Relative: 0 %
Eosinophils Absolute: 0.1 10*3/uL (ref 0.0–0.5)
Eosinophils Relative: 1 %
HCT: 47.8 % (ref 39.0–52.0)
Hemoglobin: 16.3 g/dL (ref 13.0–17.0)
Immature Granulocytes: 0 %
Lymphocytes Relative: 10 %
Lymphs Abs: 1.1 10*3/uL (ref 0.7–4.0)
MCH: 30.5 pg (ref 26.0–34.0)
MCHC: 34.1 g/dL (ref 30.0–36.0)
MCV: 89.5 fL (ref 80.0–100.0)
Monocytes Absolute: 0.9 10*3/uL (ref 0.1–1.0)
Monocytes Relative: 7 %
Neutro Abs: 9.5 10*3/uL — ABNORMAL HIGH (ref 1.7–7.7)
Neutrophils Relative %: 82 %
Platelets: 210 10*3/uL (ref 150–400)
RBC: 5.34 MIL/uL (ref 4.22–5.81)
RDW: 13.4 % (ref 11.5–15.5)
WBC: 11.6 10*3/uL — ABNORMAL HIGH (ref 4.0–10.5)
nRBC: 0 % (ref 0.0–0.2)

## 2021-06-21 LAB — LIPASE, BLOOD: Lipase: 21 U/L (ref 11–51)

## 2021-06-21 MED ORDER — POLYETHYLENE GLYCOL 3350 17 G PO PACK: 17.0000 g | PACK | Freq: Every day | ORAL | 0 refills | Status: AC

## 2021-06-21 MED ORDER — FLEET ENEMA 7-19 GM/118ML RE ENEM: 1.0000 | ENEMA | Freq: Once | RECTAL | Status: DC

## 2021-06-21 NOTE — ED Provider Notes (Signed)
Emergency Medicine Provider Triage Evaluation Note  Jeffrey Zimmerman , a 85 y.o. male  was evaluated in triage.  Pt complains of constipation x1 week.  He has associated generalized abdominal pain.  Patient was seen in the ED 2 days ago for similar symptoms.  He has associated nausea.  Patient has tried Ex-Lax and stool softener with no relief of his symptoms.  Patient denies chest pain, shortness of breath, vomiting.  Review of Systems  Positive: Generalized abdominal pain, constipation Negative: Chest pain, shortness of breath  Physical Exam  BP 126/89   Pulse 100   Temp 98.6 F (37 C)   Resp 18   Ht 5\' 7"  (1.702 m)   Wt 83.3 kg   BMI 28.76 kg/m  Gen:   Awake, no distress   Resp:  Normal effort  MSK:   Moves extremities without difficulty  Other:  Left-sided abdominal tenderness to palpation.  Medical Decision Making  Medically screening exam initiated at 3:40 PM.  Appropriate orders placed.  [name] was informed that the remainder of the evaluation will be completed by another provider, this initial triage assessment does not replace that evaluation, and the importance of remaining in the ED until their evaluation is complete.    Jc Veron A, PA-C 06/21/21 1542    Carmin Muskrat, MD 06/21/21 [phone]

## 2021-06-21 NOTE — ED Provider Notes (Signed)
[facility] EMERGENCY DEPARTMENT Provider Note   CSN: [id number] Arrival date & time: 06/21/21  1502     History Chief Complaint  Patient presents with   Constipation    Jeffrey Zimmerman is a 85 y.o. male.  The history is provided by the patient and medical records.  Constipation Severity:  Moderate Time since last bowel movement:  2 days Timing:  Constant Progression:  Worsening Chronicity:  Chronic Context: dehydration   Context: not dietary changes   Stool description:  Hard and small Relieved by:  Nothing Worsened by:  Nothing Ineffective treatments:  None tried Associated symptoms: abdominal pain       Past Medical History:  Diagnosis Date   Adenomatous colon polyp 08/2008   Arthritis    Basal cell carcinoma    Cancer ([place])    colon   Coronary artery disease    04/21/10 Anter MI with BMS to LAD with residual proximal LAD EF 40%   Gout    History of cholecystectomy    History of colonoscopy    1/5 AND 2/10   Hypertension    Irritable bowel syndrome    Myocardial infarct [facility])    SVT (supraventricular tachycardia) ([place])     Patient Active Problem List   Diagnosis Date Noted   Bilateral lower extremity edema 04/10/2016   Old myocardial infarction 08/10/2013   Mixed hyperlipidemia 08/10/2013   Other malaise and fatigue 08/08/2013   Hypertension    Coronary artery disease    Cancer ([facility])    Basal cell carcinoma    Gout    Irritable bowel syndrome    SVT (supraventricular tachycardia) ([place])     Past Surgical History:  Procedure Laterality Date   APPENDECTOMY     CARDIAC CATHETERIZATION     04/21/10 Anter MI with BMS to LAD with residual proximal LAD EF 40%   CHOLECYSTECTOMY     pt does not remember when   KNEE ARTHOSCOPY Bilateral    PARTIAL LEFT NEPHRECTOMY     (for oncocytoma-benign)       Family History  Problem Relation Age of Onset   Heart attack Mother    CAD Mother    Colon cancer Father    Rheum arthritis  Maternal Grandfather     Social History   Tobacco Use   Smoking status: Never   Smokeless tobacco: Never  Vaping Use   Vaping Use: Never used  Substance Use Topics   Alcohol use: No   Drug use: No    Home Medications Prior to Admission medications   Medication Sig Start Date End Date Taking? Authorizing Provider  polyethylene glycol (MIRALAX) 17 g packet Take 17 g by mouth daily. 06/21/21 07/21/21 Yes Tyona Nilsen, Burnadette Peter, MD  amLODipine (NORVASC) 5 MG tablet TAKE 1 TABLET BY MOUTH DAILY Patient not taking: Reported on 06/18/2021 03/28/19   [name], MD  Cholecalciferol (VITAMIN D3) 50 MCG (2000 UT) TABS Take 2,000 Units by mouth in the morning.    [provider]  clopidogrel (PLAVIX) 75 MG tablet Take 1 tablet (75 mg total) by mouth daily. Patient not taking: Reported on 06/18/2021 07/05/20   [name], MD  fluticasone [facility] SENSIMIST) 27.5 MCG/SPRAY nasal spray Place 1 spray into the nose daily.    [provider]  lactase (LACTAID) 3000 units tablet Take 3,000-6,000 Units by mouth 3 (three) times daily with meals.    [provider]  lisinopril-hydrochlorothiazide (ZESTORETIC) 20-25 MG tablet Take 1 tablet  by mouth daily.    [provider]  Loperamide-Simethicone 2-125 MG TABS Take 1 tablet by mouth 3 (three) times daily before meals. TAKE 45 MINUTES BEFORE MEALS    [provider]  metoprolol tartrate (LOPRESSOR) 25 MG tablet TAKE 1/2 TABLET BY MOUTH TWICE A DAY 10/09/20   Belva Crome, MD  nitroGLYCERIN (NITROSTAT) 0.4 MG SL tablet Place 1 tablet (0.4 mg total) under the tongue every 5 (five) minutes as needed for chest pain. Patient not taking: Reported on 06/18/2021 05/13/16   [name], MD  potassium chloride (KLOR-CON) 10 MEQ tablet Take 1 tablet (10 mEq total) by mouth 2 (two) times daily. 06/19/21   Lacretia Leigh, MD  pravastatin (PRAVACHOL) 20 MG tablet TAKE 1 TABLET (20 MG TOTAL) BY MOUTH DAILY.  PLEASE SCHEDULE APPOINTMENT FOR FUTURE REFILLS. 08/07/20   [name], MD  tamsulosin (FLOMAX) 0.4 MG CAPS capsule Take 0.8 mg by mouth at bedtime. 11/19/19   [provider]    Allergies    Codeine  Review of Systems   Review of Systems  Gastrointestinal:  Positive for abdominal pain and constipation.   Physical Exam Updated Vital Signs BP (!) 143/70   Pulse 91   Temp 98.6 F (37 C)   Resp 20   Ht 5\' 7"  (1.702 m)   Wt 83.3 kg   SpO2 94%   BMI 28.76 kg/m   Physical Exam Vitals and nursing note reviewed.  Constitutional:      General: He is not in acute distress.    Appearance: Normal appearance. He is well-developed.  HENT:     Head: Normocephalic and atraumatic.     Right Ear: External ear normal.     Left Ear: External ear normal.     Nose: Nose normal. No congestion.     Mouth/Throat:     Mouth: Mucous membranes are moist.     Pharynx: Oropharynx is clear. No posterior oropharyngeal erythema.  Eyes:     Extraocular Movements: Extraocular movements intact.     Conjunctiva/sclera: Conjunctivae normal.     Pupils: Pupils are equal, round, and reactive to light.  Cardiovascular:     Rate and Rhythm: Normal rate and regular rhythm.     Pulses: Normal pulses.     Heart sounds: No murmur heard. Pulmonary:     Effort: Pulmonary effort is normal. No respiratory distress.     Breath sounds: Normal breath sounds. No wheezing, rhonchi or rales.  Abdominal:     General: Abdomen is flat. Bowel sounds are normal. There is distension.     Palpations: Abdomen is soft.     Tenderness: There is no abdominal tenderness. There is no guarding or rebound.  Genitourinary:    Comments: Hard stool noted in rectal vault.  No blood.  No hemorrhoids or fissures. Musculoskeletal:        General: No swelling, tenderness or deformity. Normal range of motion.     Cervical back: Normal range of motion and neck supple. No rigidity.  Skin:    General: Skin is warm and dry.      Capillary Refill: Capillary refill takes less than 2 seconds.     Findings: No rash.  Neurological:     General: No focal deficit present.     Mental Status: He is alert and oriented to person, place, and time.  Psychiatric:        Mood and Affect: Mood normal.    ED Results / Procedures /  Treatments   Labs (all labs ordered are listed, but only abnormal results are displayed) Labs Reviewed  COMPREHENSIVE METABOLIC PANEL - Abnormal; Notable for the following components:      Result Value   Chloride 97 (*)    Glucose, Bld 105 (*)    Total Bilirubin 2.5 (*)    GFR, Estimated 58 (*)    All other components within normal limits  CBC WITH DIFFERENTIAL/PLATELET - Abnormal; Notable for the following components:   WBC 11.6 (*)    Neutro Abs 9.5 (*)    All other components within normal limits  LIPASE, BLOOD    EKG None  Radiology No results found.  Procedures Procedures   Medications Ordered in ED Medications  sodium phosphate (FLEET) 7-19 GM/118ML enema 1 enema (has no administration in time range)    ED Course  I have reviewed the triage vital signs and the nursing notes.  Pertinent labs & imaging results that were available during my care of the patient were reviewed by me and considered in my medical decision making (see chart for details).    MDM Rules/Calculators/A&P  85 year old male presenting with constipation.  Recently seen in the ED for same.  CT abdomen pelvis negative for acute obstruction, masses, perforation.  He returns with persisting constipation.  Has not been using any stool softeners at home.  Exam as above.  Hard stool noted in rectal vault.  Patient was manually disimpacted and subsequently had a normal bowel movement.  We will hold off on enema at this time.  Repeat abdominal exam benign.  He is not peritonitic.  He is afebrile.  Foley catheter in place and draining urine.  No indications for repeat imaging at this time.  Patient requesting  discharge home.  We will provide a prescription for MiraLAX to be given daily and have him follow up w/ pcp for further management of constipation. Strict return precautions provided.    Final Clinical Impression(s) / ED Diagnoses Final diagnoses:  Constipation, unspecified constipation type    Rx / DC Orders ED Discharge Orders          Ordered    polyethylene glycol (MIRALAX) 17 g packet  Daily        06/21/21 2041             Idamae Lusher, MD 06/21/21 2049    Tegeler, Gwenyth Allegra, MD 06/24/21 1209

## 2021-06-21 NOTE — Discharge Instructions (Signed)
Please pick up MiraLAX from your pharmacy and start taking this daily.  Getting lots of hydration drink plenty of fluids and increase your mobility to assist with persistent constipation.  Please follow-up with your PCP to discuss further management as necessary.

## 2021-06-21 NOTE — ED Notes (Signed)
[facility] cancelled

## 2021-06-21 NOTE — ED Notes (Signed)
[facility] called for pt 

## 2021-06-21 NOTE — ED Notes (Signed)
The pt is very hard of hearing

## 2021-06-21 NOTE — ED Notes (Signed)
Pt refusing bladder scan. Stated "I wanna go home". This RN notified MD.

## 2021-06-21 NOTE — ED Notes (Signed)
Pt c/o total body pain

## 2021-06-21 NOTE — ED Triage Notes (Signed)
The pt arrived by gems from home where he lives with his wife  they were called for abd pain   when they arrived they were told he had slid to the floor from his chair  no c/os pain main complaint was no bm for a week  the same paramedic had  brought him in last week for the same alert

## 2021-06-22 NOTE — H&P (Signed)
HPI:   Jeffrey Zimmerman is a 85 y.o. male who presents as a consult Patient.   Referring Provider: Talmadge Coventry,*  Chief complaint: Basal cell carcinoma of the right cheek.  HPI: Worked up by Dr. Claudia Desanctis for a basal cell carcinoma of the right cheek. The mass extends deep down to the masseter muscle and there is a separate parotid mass that on CT scan appears to be benign. He is currently not on blood thinner but has a cardiac history.  PMH/Meds/All/SocHx/FamHx/ROS:   Past Medical History:  Diagnosis Date   BCC (basal cell carcinoma of skin)   History reviewed. No pertinent surgical history.  No family history of bleeding disorders, wound healing problems or difficulty with anesthesia.   Social History   Socioeconomic History   Marital status: Unknown  Spouse name: Not on file   Number of children: Not on file   Years of education: Not on file   Highest education level: Not on file  Occupational History   Not on file  Tobacco Use   Smoking status: Never Smoker   Smokeless tobacco: Never Used  Vaping Use   Vaping Use: Never used  Substance and Sexual Activity   Alcohol use: Not on file   Drug use: Not on file   Sexual activity: Not on file  Other Topics Concern   Not on file  Social History Narrative   Not on file   Social Determinants of Health   Financial Resource Strain: Not on file  Food Insecurity: Not on file  Transportation Needs: Not on file  Physical Activity: Not on file  Stress: Not on file  Social Connections: Not on file  Housing Stability: Not on file   Current Outpatient Medications:   clopidogreL (PLAVIX) 75 mg tablet *ANTIPLATELET*, Take 75 mg by mouth., Disp: , Rfl:   lisinopriL (PRINIVIL,ZESTRIL) 10 MG tablet, Take 1 tablet by mouth daily., Disp: , Rfl:   metoPROLOL tartrate (LOPRESSOR) 25 MG tablet, TAKE 1/2 TABLET BY MOUTH TWICE A DAY, Disp: , Rfl:   pravastatin (PRAVACHOL) 20 MG tablet, TAKE 1 TABLET (20 MG TOTAL) BY MOUTH DAILY.  PLEASE SCHEDULE APPOINTMENT FOR FUTURE REFILLS., Disp: , Rfl:   A complete ROS was performed with pertinent positives/negatives noted in the HPI. The remainder of the ROS are negative.   Physical Exam:   BP (!) 193/108  Pulse 78  Temp 97.8 F (36.6 C)  Wt 89.8 kg (198 lb)   General: Healthy and alert, in no distress, breathing easily. Normal affect. In a pleasant mood. Head: Normocephalic, atraumatic. No masses, or scars. Eyes: Pupils are equal, and reactive to light. Vision is grossly intact. No spontaneous or gaze nystagmus. Ears: Ear canals are clear. Tympanic membranes are intact, with normal landmarks and the middle ears are clear and healthy. Hearing: Grossly diminished. Nose: Nasal cavities are clear with healthy mucosa, no polyps or exudate. Airways are patent. Face: 2-1/2 cm ulcerative mass involving the right cheek area just inferior to the zygomatic arch and deeply infiltrating but not fixed. There is a separate 2 cm rubbery mass inferiorly in the parotid gland also on the right, facial nerve function is symmetric. Oral Cavity: No mucosal abnormalities are noted. Tongue with normal mobility. Dentition appears healthy. Oropharynx: Tonsils are symmetric. There are no mucosal masses identified. Tongue base appears normal and healthy. Larynx/Hypopharynx: deferred Chest: Deferred Neck: No palpable masses, no cervical adenopathy, no thyroid nodules or enlargement. Neuro: Cranial nerves II-XII with normal function. Balance: Normal gate. Other  findings: none.  Independent Review of Additional Tests or Records:  none  Procedures:  none  Impression & Plans:  Right cheek skin basal cell carcinoma deeply infiltrating towards the masseter. This is going to involve wide resection and facial nerve dissection in order to avoid injury to the upper division of the facial nerve. Since we have to dissect the facial nerve anyway we may as well go ahead and proceed with a superficial  parotidectomy and get rid of the other mass. I will coordinate with Dr. Claudia Desanctis to do this at the same time as the skin cancer resection and any reconstructive needs.

## 2021-06-22 NOTE — ED Notes (Signed)
Patient verbalizes understanding of d/c instructions. Opportunities for questions and answers were provided. Pt d/c from ED to lobby via wheelchair. Taxi called to pick pt up.

## 2021-06-23 ENCOUNTER — Encounter (HOSPITAL_COMMUNITY)
Admission: RE | Disposition: A | Discharge status: Home / Self Care | Payer: Self-pay | Source: Ambulatory Visit | Attending: Plastic Surgery

## 2021-06-23 ENCOUNTER — Other Ambulatory Visit: Payer: Self-pay | Admitting: Surgical

## 2021-06-23 ENCOUNTER — Ambulatory Visit (HOSPITAL_COMMUNITY): Payer: Medicare (Managed Care) | Admitting: Physician Assistant

## 2021-06-23 ENCOUNTER — Encounter (HOSPITAL_COMMUNITY): Payer: Self-pay | Admitting: Plastic Surgery

## 2021-06-23 ENCOUNTER — Observation Stay (HOSPITAL_COMMUNITY)
Admission: RE | Admit: 2021-06-23 | Discharge: 2021-06-24 | Disposition: A | Discharge status: Home / Self Care | Payer: Medicare (Managed Care) | Source: Ambulatory Visit | Attending: Plastic Surgery | Admitting: Plastic Surgery

## 2021-06-23 ENCOUNTER — Ambulatory Visit (HOSPITAL_COMMUNITY): Payer: Medicare (Managed Care) | Admitting: Certified Registered Nurse Anesthetist

## 2021-06-23 DIAGNOSIS — C44310 Basal cell carcinoma of skin of unspecified parts of face: Secondary | ICD-10-CM

## 2021-06-23 DIAGNOSIS — I10 Essential (primary) hypertension: Secondary | ICD-10-CM | POA: Insufficient documentation

## 2021-06-23 DIAGNOSIS — Z79899 Other long term (current) drug therapy: Secondary | ICD-10-CM | POA: Insufficient documentation

## 2021-06-23 DIAGNOSIS — C4491 Basal cell carcinoma of skin, unspecified: Secondary | ICD-10-CM | POA: Diagnosis present

## 2021-06-23 DIAGNOSIS — Z7902 Long term (current) use of antithrombotics/antiplatelets: Secondary | ICD-10-CM | POA: Insufficient documentation

## 2021-06-23 DIAGNOSIS — C44319 Basal cell carcinoma of skin of other parts of face: Principal | ICD-10-CM | POA: Insufficient documentation

## 2021-06-23 DIAGNOSIS — I2510 Atherosclerotic heart disease of native coronary artery without angina pectoris: Secondary | ICD-10-CM | POA: Insufficient documentation

## 2021-06-23 DIAGNOSIS — Z85038 Personal history of other malignant neoplasm of large intestine: Secondary | ICD-10-CM | POA: Diagnosis not present

## 2021-06-23 DIAGNOSIS — D2239 Melanocytic nevi of other parts of face: Secondary | ICD-10-CM | POA: Diagnosis not present

## 2021-06-23 DIAGNOSIS — D110 Benign neoplasm of parotid gland: Secondary | ICD-10-CM | POA: Diagnosis not present

## 2021-06-23 HISTORY — PX: ADJACENT TISSUE TRANSFER/TISSUE REARRANGEMENT: SHX6829

## 2021-06-23 HISTORY — PX: DEBRIDEMENT AND CLOSURE WOUND: SHX5614

## 2021-06-23 HISTORY — PX: PAROTIDECTOMY: SHX2163

## 2021-06-23 SURGERY — APPLICATION, SKIN SUBSTITUTE
Anesthesia: General | Site: Face | Laterality: Right

## 2021-06-23 MED ORDER — TRAMADOL HCL 50 MG PO TABS: 50.0000 mg | ORAL_TABLET | Freq: Four times a day (QID) | ORAL | Status: DC | PRN

## 2021-06-23 MED ORDER — CHLORHEXIDINE GLUCONATE CLOTH 2 % EX PADS: 6.0000 | MEDICATED_PAD | Freq: Once | CUTANEOUS | Status: DC

## 2021-06-23 MED ORDER — LIDOCAINE-EPINEPHRINE 1 %-1:100000 IJ SOLN
INTRAMUSCULAR | Status: DC | PRN
  Administered 2021-06-23: 20 mL

## 2021-06-23 MED ORDER — SODIUM BICARBONATE 4.2 % IV SOLN
INTRAVENOUS | Status: DC
  Filled 2021-06-23 (×2): qty 50

## 2021-06-23 MED ORDER — ONDANSETRON HCL 4 MG/2ML IJ SOLN
INTRAMUSCULAR | Status: AC
  Filled 2021-06-23: qty 2

## 2021-06-23 MED ORDER — LIDOCAINE 2% (20 MG/ML) 5 ML SYRINGE
INTRAMUSCULAR | Status: AC
  Filled 2021-06-23: qty 5

## 2021-06-23 MED ORDER — DEXAMETHASONE SODIUM PHOSPHATE 10 MG/ML IJ SOLN
INTRAMUSCULAR | Status: DC | PRN
  Administered 2021-06-23: 10 mg via INTRAVENOUS

## 2021-06-23 MED ORDER — SUCCINYLCHOLINE CHLORIDE 200 MG/10ML IV SOSY
PREFILLED_SYRINGE | INTRAVENOUS | Status: DC | PRN
  Administered 2021-06-23: 120 mg via INTRAVENOUS

## 2021-06-23 MED ORDER — AMLODIPINE BESYLATE 5 MG PO TABS
5.0000 mg | ORAL_TABLET | Freq: Every day | ORAL | Status: DC
  Administered 2021-06-23 – 2021-06-24 (×2): 5 mg via ORAL
  Filled 2021-06-23 (×2): qty 1

## 2021-06-23 MED ORDER — FENTANYL CITRATE (PF) 250 MCG/5ML IJ SOLN
INTRAMUSCULAR | Status: AC
  Filled 2021-06-23: qty 5

## 2021-06-23 MED ORDER — CHLORHEXIDINE GLUCONATE CLOTH 2 % EX PADS
6.0000 | MEDICATED_PAD | Freq: Every day | CUTANEOUS | Status: DC
  Administered 2021-06-23: 6 via TOPICAL

## 2021-06-23 MED ORDER — POLYETHYLENE GLYCOL 3350 17 G PO PACK: 17.0000 g | PACK | Freq: Every day | ORAL | Status: DC | PRN

## 2021-06-23 MED ORDER — METOPROLOL TARTRATE 12.5 MG HALF TABLET
12.5000 mg | ORAL_TABLET | Freq: Two times a day (BID) | ORAL | Status: DC
  Administered 2021-06-23 – 2021-06-24 (×2): 12.5 mg via ORAL
  Filled 2021-06-23 (×2): qty 1

## 2021-06-23 MED ORDER — PROPOFOL 10 MG/ML IV BOLUS
INTRAVENOUS | Status: DC | PRN
  Administered 2021-06-23: 30 mg via INTRAVENOUS
  Administered 2021-06-23: 120 mg via INTRAVENOUS

## 2021-06-23 MED ORDER — ONDANSETRON 4 MG PO TBDP: 4.0000 mg | ORAL_TABLET | Freq: Four times a day (QID) | ORAL | Status: DC | PRN

## 2021-06-23 MED ORDER — BACITRACIN ZINC 500 UNIT/GM EX OINT
TOPICAL_OINTMENT | CUTANEOUS | Status: DC | PRN
  Administered 2021-06-23: 1 via TOPICAL

## 2021-06-23 MED ORDER — DEXAMETHASONE SODIUM PHOSPHATE 10 MG/ML IJ SOLN
INTRAMUSCULAR | Status: AC
  Filled 2021-06-23: qty 1

## 2021-06-23 MED ORDER — FENTANYL CITRATE (PF) 250 MCG/5ML IJ SOLN
INTRAMUSCULAR | Status: DC | PRN
  Administered 2021-06-23 (×2): 50 ug via INTRAVENOUS
  Administered 2021-06-23: 100 ug via INTRAVENOUS

## 2021-06-23 MED ORDER — TRAMADOL HCL 50 MG PO TABS: 50.0000 mg | ORAL_TABLET | Freq: Four times a day (QID) | ORAL | 0 refills | Status: AC | PRN

## 2021-06-23 MED ORDER — HYDROCHLOROTHIAZIDE 25 MG PO TABS
25.0000 mg | ORAL_TABLET | Freq: Every day | ORAL | Status: DC
  Administered 2021-06-23 – 2021-06-24 (×2): 25 mg via ORAL
  Filled 2021-06-23 (×2): qty 1

## 2021-06-23 MED ORDER — BACITRACIN ZINC 500 UNIT/GM EX OINT
TOPICAL_OINTMENT | CUTANEOUS | Status: AC
  Filled 2021-06-23: qty 28.35

## 2021-06-23 MED ORDER — ACETAMINOPHEN 500 MG PO TABS
1000.0000 mg | ORAL_TABLET | Freq: Once | ORAL | Status: AC
  Administered 2021-06-23: 1000 mg via ORAL
  Filled 2021-06-23: qty 2

## 2021-06-23 MED ORDER — PHENYLEPHRINE 40 MCG/ML (10ML) SYRINGE FOR IV PUSH (FOR BLOOD PRESSURE SUPPORT)
PREFILLED_SYRINGE | INTRAVENOUS | Status: AC
  Filled 2021-06-23: qty 10

## 2021-06-23 MED ORDER — DOCUSATE SODIUM 100 MG PO CAPS
100.0000 mg | ORAL_CAPSULE | Freq: Two times a day (BID) | ORAL | Status: DC
  Administered 2021-06-23 – 2021-06-24 (×2): 100 mg via ORAL
  Filled 2021-06-23 (×2): qty 1

## 2021-06-23 MED ORDER — METOPROLOL TARTRATE 12.5 MG HALF TABLET: 12.5000 mg | ORAL_TABLET | Freq: Once | ORAL | Status: AC

## 2021-06-23 MED ORDER — EPHEDRINE SULFATE-NACL 50-0.9 MG/10ML-% IV SOSY
PREFILLED_SYRINGE | INTRAVENOUS | Status: DC | PRN
  Administered 2021-06-23: 10 mg via INTRAVENOUS
  Administered 2021-06-23 (×2): 5 mg via INTRAVENOUS

## 2021-06-23 MED ORDER — LISINOPRIL-HYDROCHLOROTHIAZIDE 20-25 MG PO TABS: 1.0000 | ORAL_TABLET | Freq: Every day | ORAL | Status: DC

## 2021-06-23 MED ORDER — ONDANSETRON HCL 4 MG PO TABS: 4.0000 mg | ORAL_TABLET | Freq: Three times a day (TID) | ORAL | 0 refills | Status: DC | PRN

## 2021-06-23 MED ORDER — CEFAZOLIN SODIUM-DEXTROSE 2-4 GM/100ML-% IV SOLN
2.0000 g | INTRAVENOUS | Status: AC
  Administered 2021-06-23: 2 g via INTRAVENOUS
  Filled 2021-06-23: qty 100

## 2021-06-23 MED ORDER — LIDOCAINE-EPINEPHRINE 1 %-1:100000 IJ SOLN
INTRAMUSCULAR | Status: AC
  Filled 2021-06-23: qty 1

## 2021-06-23 MED ORDER — LIDOCAINE 2% (20 MG/ML) 5 ML SYRINGE
INTRAMUSCULAR | Status: DC | PRN
  Administered 2021-06-23: 80 mg via INTRAVENOUS

## 2021-06-23 MED ORDER — CHLORHEXIDINE GLUCONATE CLOTH 2 % EX PADS: 6.0000 | MEDICATED_PAD | Freq: Every day | CUTANEOUS | Status: DC

## 2021-06-23 MED ORDER — PHENYLEPHRINE 40 MCG/ML (10ML) SYRINGE FOR IV PUSH (FOR BLOOD PRESSURE SUPPORT)
PREFILLED_SYRINGE | INTRAVENOUS | Status: DC | PRN
  Administered 2021-06-23: 120 ug via INTRAVENOUS

## 2021-06-23 MED ORDER — CHLORHEXIDINE GLUCONATE 0.12 % MT SOLN
15.0000 mL | Freq: Once | OROMUCOSAL | Status: AC
  Administered 2021-06-23: 15 mL via OROMUCOSAL
  Filled 2021-06-23: qty 15

## 2021-06-23 MED ORDER — ACETAMINOPHEN 500 MG PO TABS
1000.0000 mg | ORAL_TABLET | Freq: Four times a day (QID) | ORAL | Status: DC
  Administered 2021-06-23 – 2021-06-24 (×4): 1000 mg via ORAL
  Filled 2021-06-23 (×4): qty 2

## 2021-06-23 MED ORDER — PROPOFOL 10 MG/ML IV BOLUS
INTRAVENOUS | Status: AC
  Filled 2021-06-23: qty 20

## 2021-06-23 MED ORDER — DIPHENHYDRAMINE HCL 12.5 MG/5ML PO ELIX: 12.5000 mg | ORAL_SOLUTION | Freq: Four times a day (QID) | ORAL | Status: DC | PRN

## 2021-06-23 MED ORDER — MORPHINE SULFATE (PF) 2 MG/ML IV SOLN: 1.0000 mg | INTRAVENOUS | Status: DC | PRN

## 2021-06-23 MED ORDER — DIPHENHYDRAMINE HCL 50 MG/ML IJ SOLN: 12.5000 mg | Freq: Four times a day (QID) | INTRAMUSCULAR | Status: DC | PRN

## 2021-06-23 MED ORDER — EPHEDRINE 5 MG/ML INJ
INTRAVENOUS | Status: AC
  Filled 2021-06-23: qty 5

## 2021-06-23 MED ORDER — PHENYLEPHRINE HCL-NACL 20-0.9 MG/250ML-% IV SOLN
INTRAVENOUS | Status: DC | PRN
  Administered 2021-06-23: 50 ug/min via INTRAVENOUS

## 2021-06-23 MED ORDER — 0.9 % SODIUM CHLORIDE (POUR BTL) OPTIME
TOPICAL | Status: DC | PRN
  Administered 2021-06-23: 1000 mL

## 2021-06-23 MED ORDER — ONDANSETRON HCL 4 MG/2ML IJ SOLN
INTRAMUSCULAR | Status: DC | PRN
  Administered 2021-06-23: 4 mg via INTRAVENOUS

## 2021-06-23 MED ORDER — SUCCINYLCHOLINE CHLORIDE 200 MG/10ML IV SOSY
PREFILLED_SYRINGE | INTRAVENOUS | Status: AC
  Filled 2021-06-23: qty 10

## 2021-06-23 MED ORDER — ORAL CARE MOUTH RINSE: 15.0000 mL | Freq: Once | OROMUCOSAL | Status: AC

## 2021-06-23 MED ORDER — TAMSULOSIN HCL 0.4 MG PO CAPS
0.8000 mg | ORAL_CAPSULE | Freq: Every day | ORAL | Status: DC
  Administered 2021-06-23: 0.8 mg via ORAL
  Filled 2021-06-23: qty 2

## 2021-06-23 MED ORDER — METOPROLOL TARTRATE 12.5 MG HALF TABLET
ORAL_TABLET | ORAL | Status: AC
  Administered 2021-06-23: 12.5 mg via ORAL
  Filled 2021-06-23: qty 1

## 2021-06-23 MED ORDER — BUPIVACAINE-EPINEPHRINE 0.25% -1:200000 IJ SOLN
INTRAMUSCULAR | Status: DC | PRN
  Administered 2021-06-23: 30 mL

## 2021-06-23 MED ORDER — LACTATED RINGERS IV SOLN: INTRAVENOUS | Status: DC

## 2021-06-23 MED ORDER — LISINOPRIL 20 MG PO TABS
20.0000 mg | ORAL_TABLET | Freq: Every day | ORAL | Status: DC
  Administered 2021-06-23 – 2021-06-24 (×2): 20 mg via ORAL
  Filled 2021-06-23 (×2): qty 1

## 2021-06-23 MED ORDER — ARTIFICIAL TEARS OPHTHALMIC OINT
TOPICAL_OINTMENT | OPHTHALMIC | Status: DC | PRN
  Filled 2021-06-23: qty 3.5

## 2021-06-23 MED ORDER — BUPIVACAINE-EPINEPHRINE (PF) 0.25% -1:200000 IJ SOLN
INTRAMUSCULAR | Status: AC
  Filled 2021-06-23: qty 30

## 2021-06-23 MED ORDER — ONDANSETRON HCL 4 MG/2ML IJ SOLN: 4.0000 mg | Freq: Once | INTRAMUSCULAR | Status: DC | PRN

## 2021-06-23 MED ORDER — ONDANSETRON HCL 4 MG/2ML IJ SOLN: 4.0000 mg | Freq: Four times a day (QID) | INTRAMUSCULAR | Status: DC | PRN

## 2021-06-23 MED ORDER — FENTANYL CITRATE (PF) 100 MCG/2ML IJ SOLN: 25.0000 ug | INTRAMUSCULAR | Status: DC | PRN

## 2021-06-23 MED ORDER — IBUPROFEN 600 MG PO TABS: 600.0000 mg | ORAL_TABLET | Freq: Four times a day (QID) | ORAL | Status: DC | PRN

## 2021-06-23 MED ORDER — LACTASE 3000 UNITS PO TABS
3000.0000 [IU] | ORAL_TABLET | Freq: Three times a day (TID) | ORAL | Status: DC
Start: 2021-06-23 — End: 2021-06-24
  Administered 2021-06-23 – 2021-06-24 (×3): 6000 [IU] via ORAL
  Filled 2021-06-23 (×5): qty 2

## 2021-06-23 SURGICAL SUPPLY — 47 items
BAG COUNTER SPONGE SURGICOUNT (BAG) ×1 IMPLANT
BAG SPNG CNTER NS LX DISP (BAG) ×2
BAG SURGICOUNT SPONGE COUNTING (BAG) ×1
BLADE CLIPPER SURG (BLADE) ×2 IMPLANT
BLADE SURG 15 STRL LF DISP TIS (BLADE) ×2 IMPLANT
BLADE SURG 15 STRL SS (BLADE) ×4
CANISTER SUCT 1200ML W/VALVE (MISCELLANEOUS) ×4 IMPLANT
CANISTER SUCT 3000ML PPV (MISCELLANEOUS) ×4 IMPLANT
CLEANER TIP ELECTROSURG 2X2 (MISCELLANEOUS) ×4 IMPLANT
CORD BIPOLAR FORCEPS 12FT (ELECTRODE) ×4 IMPLANT
COVER BACK TABLE 60X90IN (DRAPES) ×4 IMPLANT
COVER MAYO STAND STRL (DRAPES) ×4 IMPLANT
COVER SURGICAL LIGHT HANDLE (MISCELLANEOUS) ×4 IMPLANT
DRAIN PENROSE 12X.25 LTX STRL (MISCELLANEOUS) ×2 IMPLANT
ELECT COATED BLADE 2.86 ST (ELECTRODE) ×4 IMPLANT
ELECT REM PT RETURN 9FT ADLT (ELECTROSURGICAL) ×4
ELECTRODE REM PT RTRN 9FT ADLT (ELECTROSURGICAL) ×2 IMPLANT
FORCEPS BIPOLAR SPETZLER 8 1.0 (NEUROSURGERY SUPPLIES) ×2 IMPLANT
GAUZE 4X4 16PLY ~~LOC~~+RFID DBL (SPONGE) ×6 IMPLANT
GAUZE SPONGE 2X2 8PLY STRL LF (GAUZE/BANDAGES/DRESSINGS) IMPLANT
GAUZE SPONGE 4X4 12PLY STRL (GAUZE/BANDAGES/DRESSINGS) ×2 IMPLANT
GAUZE XEROFORM 5X9 LF (GAUZE/BANDAGES/DRESSINGS) ×2 IMPLANT
GLOVE SURG ENC TEXT LTX SZ7.5 (GLOVE) ×4 IMPLANT
GLOVE SURG UNDER LTX SZ8 (GLOVE) ×4 IMPLANT
GOWN STRL REUS W/ TWL LRG LVL3 (GOWN DISPOSABLE) ×4 IMPLANT
GOWN STRL REUS W/TWL LRG LVL3 (GOWN DISPOSABLE) ×8
KIT BASIN OR (CUSTOM PROCEDURE TRAY) ×4 IMPLANT
KIT TURNOVER KIT B (KITS) ×4 IMPLANT
LOCATOR NERVE 3 VOLT (DISPOSABLE) ×2 IMPLANT
MATRIX WOUND MESHED 2X2 (Tissue) IMPLANT
NS IRRIG 1000ML POUR BTL (IV SOLUTION) ×4 IMPLANT
PAD ARMBOARD 7.5X6 YLW CONV (MISCELLANEOUS) ×8 IMPLANT
PENCIL FOOT CONTROL (ELECTRODE) ×4 IMPLANT
SHEARS HARMONIC 9CM CVD (BLADE) ×4 IMPLANT
SPONGE GAUZE 2X2 STER 10/PKG (GAUZE/BANDAGES/DRESSINGS) ×2
SUT CHROMIC 3 0 SH 27 (SUTURE) ×4 IMPLANT
SUT ETHILON 3 0 PS 1 (SUTURE) ×2 IMPLANT
SUT MNCRL AB 4-0 PS2 18 (SUTURE) ×8 IMPLANT
SUT PLAIN 5 0 P 3 18 (SUTURE) ×6 IMPLANT
SUT SILK 2 0 SH CR/8 (SUTURE) ×4 IMPLANT
SUT SILK 4 0 REEL (SUTURE) ×6 IMPLANT
TOWEL GREEN STERILE FF (TOWEL DISPOSABLE) ×4 IMPLANT
TRAY ENT MC OR (CUSTOM PROCEDURE TRAY) ×8 IMPLANT
TRAY FOLEY MTR SLVR 16FR STAT (SET/KITS/TRAYS/PACK) ×2 IMPLANT
TUBE CONNECTING 20'X1/4 (TUBING) ×1
TUBE CONNECTING 20X1/4 (TUBING) ×3 IMPLANT
WOUND MATRIX MESHED 2X2 (Tissue) ×4 IMPLANT

## 2021-06-23 NOTE — Interval H&P Note (Signed)
History and Physical Interval Note:  06/23/2021 7:23 AM  Jeffrey Zimmerman  has presented today for surgery, with the diagnosis of Basal Cell Carcinoma of face.  The various methods of treatment have been discussed with the patient and family. After consideration of risks, benefits and other options for treatment, the patient has consented to  Procedure(s): POSSIBLE APPLICATION OF SKIN SUBSTITUTE (Right) POSSIBLE ADJACENT TISSUE TRANSFER/TISSUE REARRANGEMENT (Right) Right cheek reconstruction (Right) PAROTIDECTOMY  RIGHT SUPERFICIAL WITH FACIAL NERVE DISSECTION (N/A) as a surgical intervention.  The patient's history has been reviewed, patient examined, no change in status, stable for surgery.  I have reviewed the patient's chart and labs.  Questions were answered to the patient's satisfaction.     Izora Gala

## 2021-06-23 NOTE — Anesthesia Procedure Notes (Signed)
Procedure Name: Intubation Date/Time: 06/23/2021 7:46 AM Performed by: Dorthea Cove, CRNA Pre-anesthesia Checklist: Patient identified, Emergency Drugs available, Suction available and Patient being monitored Patient Re-evaluated:Patient Re-evaluated prior to induction Oxygen Delivery Method: Circle system utilized Preoxygenation: Pre-oxygenation with 100% oxygen Induction Type: IV induction Ventilation: Mask ventilation without difficulty Laryngoscope Size: Mac and 4 Grade View: Grade I Tube type: Oral Tube size: 7.5 mm Number of attempts: 1 Airway Equipment and Method: Stylet and Oral airway Placement Confirmation: ETT inserted through vocal cords under direct vision, positive ETCO2 and breath sounds checked- equal and bilateral Secured at: 23 cm Tube secured with: Tape Dental Injury: Teeth and Oropharynx as per pre-operative assessment

## 2021-06-23 NOTE — Op Note (Signed)
Operative Note   DATE OF OPERATION: 06/23/2021  SURGICAL DEPARTMENT: Plastic Surgery  PREOPERATIVE DIAGNOSES: Right cheek basal cell carcinoma  POSTOPERATIVE DIAGNOSES:  same  PROCEDURE: 1.  Right cheek reconstruction with adjacent tissue transfer totaling 12 x 4 cm including primary and secondary defect 2.   Right cheek reconstruction with Integra meshed bilayer skin substitute totaling 4 x 3 cm  SURGEON: Talmadge Coventry, MD  ASSISTANT: Izora Gala, MD and [employer], [place] Dr. Constance Holster assisted throughout the case.  He was essential in retraction and counter traction when needed to make the case progress smoothly.  This retraction and assistance made it possible to see the tissue planes for the procedure.  The assistance was needed for hemostasis, tissue re-approximation and closure of the incision site.   ANESTHESIA:  General.   COMPLICATIONS: None.   INDICATIONS FOR PROCEDURE:  The patient, Jeffrey Zimmerman is a 85 y.o. male born on 1936/03/20, is here for treatment of right cheek basal cell carcinoma MRN: [id number]  CONSENT:  Informed consent was obtained directly from the patient. Risks, benefits and alternatives were fully discussed. Specific risks including but not limited to bleeding, infection, hematoma, seroma, scarring, pain, contracture, asymmetry, wound healing problems, and need for further surgery were all discussed. The patient did have an ample opportunity to have questions answered to satisfaction.   DESCRIPTION OF PROCEDURE:  The patient was taken to the operating room. SCDs were placed and antibiotics were given.  General anesthesia was administered.  The patient's operative site was prepped and draped in a sterile fashion. A time out was performed and all information was confirmed to be correct.  I assisted with Dr. Janeice Robinson part of the case which included removal of the basal cell carcinoma and a superficial parotidectomy.  Patient this point patient was turned over to  me.  It appeared I would be able to get a partial closure by advancing the cervical skin as the incision had already been made.  In order to get a complete closure  fairly significant undermining and advancement would be required and I elected not to pursue that in this patient due to his comorbidities and the fact that margins had yet to be clarified on final pathology.  The available incision was utilized and lidocaine and Marcaine with epinephrine were injected circumferentially.  I then used tenotomy scissors to do circumferential undermining, mostly in the neck area.  I advanced the available available neck skin up to close the defect from 6 x 4 cm to 4 x 3 cm.  A Penrose drain was left coming out the inferior portion of the incision and this was closed with interrupted buried 4-0 Monocryl sutures and a running 5-0 plain gut.  The exposed masseter was then covered with a piece of meshed Integra bilayer.  This was secured with Monocryl sutures.  Bolster was then fashioned with a scrub brush sponge, Xeroform, 3-0 nylon.  This gave a nice on table result.  Soft dressing was applied.  The patient tolerated the procedure well.  There were no complications. The patient was allowed to wake from anesthesia, extubated and taken to the recovery room in satisfactory condition.

## 2021-06-23 NOTE — Progress Notes (Signed)
Patient ID: Jeffrey Zimmerman, male   DOB: Apr 30, 1936, 85 y.o.   MRN: [id number] Postop check  Is awake and alert.  Dressing in place on the right side.  His breathing is clear.  His upper division is very weak but the lower division seems to be working better.  Stable postop.  We will initiate eye care and will plan to discharge home when he is able to get back to his facility.

## 2021-06-23 NOTE — Interval H&P Note (Signed)
Patient seen and examined. Risk and benefits discussed. Proceed with surgery.

## 2021-06-23 NOTE — Op Note (Signed)
OPERATIVE REPORT  DATE OF SURGERY: 06/23/2021  PATIENT:  Jeffrey Zimmerman,  85 y.o. male  PRE-OPERATIVE DIAGNOSIS:  Basal Cell Carcinoma of face  POST-OPERATIVE DIAGNOSIS:  Basal Cell Carcinoma of face  PROCEDURE:  Procedure(s): APPLICATION OF SKIN SUBSTITUTE ADJACENT TISSUE TRANSFER/TISSUE REARRANGEMENT Right cheek reconstruction PAROTIDECTOMY  RIGHT SUPERFICIAL WITH FACIAL NERVE DISSECTION  SURGEON:  Beckie Salts, MD, and C. Pace, MD  ASSISTANTS: None  ANESTHESIA:   General   EBL: 50 ml  DRAINS: Quarter-inch Penrose  LOCAL MEDICATIONS USED:  None  SPECIMEN: Right superficial parotid, with overlying skin  COUNTS:  Correct  PROCEDURE DETAILS: The patient was taken to the operating room and placed on the operating table in the supine position. Following induction of general endotracheal anesthesia, the right side of the face was prepped and draped in a standard fashion.  Incision was outlined around the primary skin lesion by Dr. Claudia Desanctis and I extended this a curvilinear fashion down into the upper neck.  Electrocautery was used to incise the skin and subcutaneous tissue. The lateral branch of the greater auricular nerve was not preserved. The parotid gland was dissected forward off of the upper sternocleidomastoid muscle and the digastric muscle was exposed posteriorly. The gland was also dissected off of the external auditory canal. Careful dissection superior to the digastric muscle and medial to the tympanomastoid suture line revealed the main trunk of the facial nerve. Using a McCabe dissector the main trunk was dissected out towards the pes anserinus. The upper and lower divisions were then dissected identifying all branches.  All branches were preserved except for what appeared to be a masseteric branch which was extending out directly into the skin lesion and was sacrificed..    The harmonic scalpel was used to divide the parotid tissue. Bipolar cautery was used as needed for  completion of hemostasis. The intraparotid tumor was identified in the lower superficial part of the gland. The glandular tissue with the accompanying tumor and overlying skin lesion.  Was dissected free and removed, sent for pathologic evaluation. The wound was irrigated with saline and hemostasis was completed as needed.  The drain was exited through the lower part of the incision and secured in place with suture.  Closure and reconstruction was performed by Dr. Claudia Desanctis.      PATIENT DISPOSITION:  To PACU, stable

## 2021-06-23 NOTE — Transfer of Care (Signed)
Immediate Anesthesia Transfer of Care Note  Patient: Jeffrey Zimmerman  Procedure(s) Performed: APPLICATION OF SKIN SUBSTITUTE (Right: Face) ADJACENT TISSUE TRANSFER/TISSUE REARRANGEMENT (Right: Face) Right cheek reconstruction (Right: Face) PAROTIDECTOMY  RIGHT SUPERFICIAL WITH FACIAL NERVE DISSECTION  Patient Location: PACU  Anesthesia Type:General  Level of Consciousness: awake and alert   Airway & Oxygen Therapy: Patient Spontanous Breathing  Post-op Assessment: Report given to RN and Post -op Vital signs reviewed and stable  Post vital signs: Reviewed and stable  Last Vitals:  Vitals Value Taken Time  BP 155/70 06/23/21 1000  Temp 36.2 C 06/23/21 1000  Pulse 101 06/23/21 1003  Resp 21 06/23/21 1003  SpO2 96 % 06/23/21 1003  Vitals shown include unvalidated device data.  Last Pain:  Vitals:   06/23/21 0610  TempSrc: Oral         Complications: No notable events documented.

## 2021-06-23 NOTE — Anesthesia Postprocedure Evaluation (Signed)
Anesthesia Post Note  Patient: Jeffrey Zimmerman  Procedure(s) Performed: APPLICATION OF SKIN SUBSTITUTE (Right: Face) ADJACENT TISSUE TRANSFER/TISSUE REARRANGEMENT (Right: Face) Right cheek reconstruction (Right: Face) PAROTIDECTOMY  RIGHT SUPERFICIAL WITH FACIAL NERVE DISSECTION     Patient location during evaluation: PACU Anesthesia Type: General Level of consciousness: awake and alert Pain management: pain level controlled Vital Signs Assessment: post-procedure vital signs reviewed and stable Respiratory status: spontaneous breathing, nonlabored ventilation, respiratory function stable and patient connected to nasal cannula oxygen Cardiovascular status: blood pressure returned to baseline and stable Postop Assessment: no apparent nausea or vomiting Anesthetic complications: no   No notable events documented.  Last Vitals:  Vitals:   06/23/21 1430 06/23/21 1530  BP: (!) 147/82 (!) 147/82  Pulse: 78 93  Resp: 20 18  Temp:    SpO2: 95% 96%    Last Pain:  Vitals:   06/23/21 1330  TempSrc:   PainSc: 0-No pain                 [name]

## 2021-06-23 NOTE — Discharge Instructions (Addendum)
Activity As tolerated: Do not get your face wet** NO driving No heavy activities  Diet: Regular  Wound Care: Keep dressing clean & dry. Do not get this area wet. Leave dressing in place, unless they become soiled. If they do, you can apply new gauze and tape over the incision.   Special Instructions: You have a drain in place, it drains directly into the gauze. The gauze may become soiled with fluid. This is normal.  Call Doctor if any unusual problems occur such as pain, excessive Bleeding, unrelieved Nausea/vomiting, Fever &/or chills  When lying down, keep head elevated on 2-3 pillows or back-rest  Follow-up appointment: Scheduled for next week.

## 2021-06-23 NOTE — Progress Notes (Signed)
Pt states he has not taken any of his medications in the last week. He states he normally takes them regularly. He said his son had his pre-op instructions from PAT. Son Camillia Herter called to confirm pt had not taken medication. Son states he has been in and out of the hospital several times over the last week related to abdominal issues and multiple falls. The first fall, the pt laid out for approximately 5 hours before he was found. He was taken to the hospital for treatment and left AMA. Son states to please call him for updates rather than pts wife, as she does not speak [place].

## 2021-06-24 ENCOUNTER — Encounter (HOSPITAL_COMMUNITY): Payer: Self-pay | Admitting: Plastic Surgery

## 2021-06-24 DIAGNOSIS — C44319 Basal cell carcinoma of skin of other parts of face: Secondary | ICD-10-CM | POA: Diagnosis not present

## 2021-06-24 LAB — SURGICAL PATHOLOGY

## 2021-06-24 NOTE — Discharge Summary (Signed)
Physician Discharge Summary  Patient ID: Jeffrey Zimmerman MRN: [id number] DOB/AGE: [birth year] 85 y.o.  Admit date: 06/23/2021 Discharge date: 06/24/2021  Admission Diagnoses:  Discharge Diagnoses:  Principal Problem:   Basal cell carcinoma (BCC)   Discharged Condition: good  Hospital Course: Patient is an 85 year old male who presented to the [facility] operating room on 06/23/2021 for excision of basal cell carcinoma of his face with Dr. Claudia Desanctis with plastic surgery and Dr. Constance Holster with ENT.  He underwent excision of right cheek basal cell carcinoma, right superficial parotidectomy and facial nerve dissection followed by adjacent tissue transfer and tissue rearrangement and application of Integra skin substitute.  Postoperatively he was doing well, however there were some disposition issues and he was held overnight for observation.  He does live alone, but does live in an assisted living facility [facility].  On evaluation this a.m. he is doing well, he denies any current concerns.  He is ready to go home.  He is eating today on exam.  He is not having any fevers or chills.  Consults: None  Significant Diagnostic Studies: none  Treatments: Tylenol, tramadol  Discharge Exam: Blood pressure 136/69, pulse 79, temperature 98.3 F (36.8 C), temperature source Oral, resp. rate 14, height 5\' 7"  (1.702 m), weight 85.3 kg, SpO2 95 %. General appearance: alert, cooperative, no distress, and sitting up in bed, resting, preparing to eat breakfast Head: Right cheek bolster in place, right cheek incisions intact and healing well.  There is no erythema or cellulitic changes noted.  Right cheek inferior Penrose drain is in place, draining serosanguineous drainage and gauze.  Minimal oozing is noted. Eyes: Some drooping of right inferior eyelid -weak upper facial division from facial dissection Resp: Unlabored Male genitalia: normal, Foley in place  Disposition: Discharge disposition: 01-Home or Self  Care       Discharge Instructions     Call MD for:  difficulty breathing, headache or visual disturbances   Complete by: As directed    Call MD for:  difficulty breathing, headache or visual disturbances   Complete by: As directed    Call MD for:  extreme fatigue   Complete by: As directed    Call MD for:  extreme fatigue   Complete by: As directed    Call MD for:  hives   Complete by: As directed    Call MD for:  hives   Complete by: As directed    Call MD for:  persistant dizziness or light-headedness   Complete by: As directed    Call MD for:  persistant dizziness or light-headedness   Complete by: As directed    Call MD for:  persistant nausea and vomiting   Complete by: As directed    Call MD for:  persistant nausea and vomiting   Complete by: As directed    Call MD for:  redness, tenderness, or signs of infection (pain, swelling, redness, odor or green/yellow discharge around incision site)   Complete by: As directed    Call MD for:  redness, tenderness, or signs of infection (pain, swelling, redness, odor or green/yellow discharge around incision site)   Complete by: As directed    Call MD for:  severe uncontrolled pain   Complete by: As directed    Call MD for:  severe uncontrolled pain   Complete by: As directed    Call MD for:  temperature >100.4   Complete by: As directed    Call MD for:  temperature >  100.4   Complete by: As directed    Diet - low sodium heart healthy   Complete by: As directed    Increase activity slowly   Complete by: As directed    Increase activity slowly   Complete by: As directed       Allergies as of 06/24/2021       Reactions   Codeine Other (See Comments)   Doesn't help with with pain control.         Medication List     TAKE these medications    amLODipine 5 MG tablet Commonly known as: NORVASC TAKE 1 TABLET BY MOUTH DAILY   clopidogrel 75 MG tablet Commonly known as: PLAVIX Take 1 tablet (75 mg total) by mouth  daily.   Flonase Sensimist 27.5 MCG/SPRAY nasal spray Generic drug: fluticasone Place 1 spray into the nose daily.   lactase 3000 units tablet Commonly known as: LACTAID Take 3,000-6,000 Units by mouth 3 (three) times daily with meals.   lisinopril-hydrochlorothiazide 20-25 MG tablet Commonly known as: ZESTORETIC Take 1 tablet by mouth daily.   Loperamide-Simethicone 2-125 MG Tabs Take 1 tablet by mouth 3 (three) times daily before meals. TAKE 45 MINUTES BEFORE MEALS   metoprolol tartrate 25 MG tablet Commonly known as: LOPRESSOR TAKE 1/2 TABLET BY MOUTH TWICE A DAY   nitroGLYCERIN 0.4 MG SL tablet Commonly known as: NITROSTAT Place 1 tablet (0.4 mg total) under the tongue every 5 (five) minutes as needed for chest pain.   ondansetron 4 MG tablet Commonly known as: Zofran Take 1 tablet (4 mg total) by mouth every 8 (eight) hours as needed for nausea or vomiting.   polyethylene glycol 17 g packet Commonly known as: MiraLax Take 17 g by mouth daily.   potassium chloride 10 MEQ tablet Commonly known as: KLOR-CON Take 1 tablet (10 mEq total) by mouth 2 (two) times daily.   pravastatin 20 MG tablet Commonly known as: PRAVACHOL TAKE 1 TABLET (20 MG TOTAL) BY MOUTH DAILY. PLEASE SCHEDULE APPOINTMENT FOR FUTURE REFILLS.   tamsulosin 0.4 MG Caps capsule Commonly known as: FLOMAX Take 0.8 mg by mouth at bedtime.   traMADol 50 MG tablet Commonly known as: Ultram Take 1 tablet (50 mg total) by mouth every 6 (six) hours as needed for up to 3 days.   Vitamin D3 50 MCG (2000 UT) Tabs Take 2,000 Units by mouth in the morning.        Follow-up Information     Izora Gala, MD. Schedule an appointment as soon as possible for a visit in 1 week(s).   Specialty: Otolaryngology Contact information: [address] [place] [address] [phone]                 [facility] [address] [place], [place]  [address] [phone]  Signed: Carola Rhine Satina Jerrell 06/24/2021, 9:11 AM

## 2021-06-24 NOTE — Progress Notes (Signed)
Patient discharged back to ALF with instructions given and transported by [facility] transporter.

## 2021-06-26 ENCOUNTER — Telehealth: Payer: Self-pay | Admitting: Physician Assistant

## 2021-06-26 NOTE — Telephone Encounter (Signed)
Returned call to [place] with [facility].  She was questioning if we have a protocol for patients that use transportation.  I let her know we did not know that he had been dropped off by transportation service and it is not something we arranged.  She is owrking on process improvement within their department and was appreciative of my call back.

## 2021-06-26 NOTE — Telephone Encounter (Signed)
[facility] called wanting to follow up on this patient's 06/18/21 visit with Ermalinda Barrios. She is wanting to know when a patient's appointment is finished if our office contacts them for the patient to be picked up.

## 2021-06-28 ENCOUNTER — Emergency Department (HOSPITAL_COMMUNITY)
Admission: EM | Admit: 2021-06-28 | Discharge: 2021-06-29 | Disposition: A | Discharge status: Home / Self Care | Payer: Medicare (Managed Care) | Attending: Emergency Medicine | Admitting: Emergency Medicine

## 2021-06-28 ENCOUNTER — Other Ambulatory Visit: Payer: Self-pay

## 2021-06-28 ENCOUNTER — Encounter (HOSPITAL_COMMUNITY): Payer: Self-pay | Admitting: Emergency Medicine

## 2021-06-28 DIAGNOSIS — I10 Essential (primary) hypertension: Secondary | ICD-10-CM | POA: Diagnosis not present

## 2021-06-28 DIAGNOSIS — Z7901 Long term (current) use of anticoagulants: Secondary | ICD-10-CM | POA: Insufficient documentation

## 2021-06-28 DIAGNOSIS — B356 Tinea cruris: Secondary | ICD-10-CM | POA: Diagnosis not present

## 2021-06-28 DIAGNOSIS — Z96653 Presence of artificial knee joint, bilateral: Secondary | ICD-10-CM | POA: Diagnosis not present

## 2021-06-28 DIAGNOSIS — R339 Retention of urine, unspecified: Secondary | ICD-10-CM | POA: Diagnosis present

## 2021-06-28 DIAGNOSIS — I2510 Atherosclerotic heart disease of native coronary artery without angina pectoris: Secondary | ICD-10-CM | POA: Insufficient documentation

## 2021-06-28 DIAGNOSIS — Z85828 Personal history of other malignant neoplasm of skin: Secondary | ICD-10-CM | POA: Diagnosis not present

## 2021-06-28 DIAGNOSIS — Z79899 Other long term (current) drug therapy: Secondary | ICD-10-CM | POA: Insufficient documentation

## 2021-06-28 NOTE — ED Provider Notes (Signed)
Emergency Medicine Provider Triage Evaluation Note  Jeffrey Zimmerman , a 85 y.o. male  was evaluated in triage.  Pt complains of pain.  He reportedly has pain down into his groin all the way around the buttocks.  He has been constipated for a week.  He has a history of urinary retention and constipation.  He did recently have a surgery.  Patient is unable to provide extra history.  He is very hard of hearing and has vision difficulties.  When I attempt to speak with him he waves his hand at me as to indicate that I should leave him alone.  Review of Systems  Positive: Abdominal pain.  Negative: Fevers  Physical Exam  There were no vitals taken for this visit. Gen:   Awake, no distress   Resp:  Normal effort  MSK:   Moves extremities without difficulty  Other:  Able to palpate pulses in bilateral lower legs.    Medical Decision Making  Medically screening exam initiated at 11:11 PM.  Appropriate orders placed.  [name] was informed that the remainder of the evaluation will be completed by another provider, this initial triage assessment does not replace that evaluation, and the importance of remaining in the ED until their evaluation is complete.  Patient is very hard of hearing with limited vision limiting ability to evaluate him as he refuses to attempt to interact with me.    He has been having issues with constipation recently.  Given that he did recently have a surgery I suspect that this is being exacerbated. I did review recent CT scan which shows normal-sized aorta, doubt he has dissection.  Will obtain CT abdomen given difficulty obtaining a history.    Lorin Glass, PA-C 06/28/21 McLennan, [place], DO 06/29/21 [phone]

## 2021-06-28 NOTE — ED Triage Notes (Signed)
BIB EMS from home where he lives with his wife.  Per EMS lower abdominal pain down to groin and all the way around to his buttocks.  Has been constipated for about a week.  Also urinary retention with painful urination.  Abdomen is distended

## 2021-06-29 ENCOUNTER — Emergency Department (HOSPITAL_COMMUNITY): Payer: Medicare (Managed Care)

## 2021-06-29 LAB — COMPREHENSIVE METABOLIC PANEL
ALT: 43 U/L (ref 0–44)
AST: 53 U/L — ABNORMAL HIGH (ref 15–41)
Albumin: 4.1 g/dL (ref 3.5–5.0)
Alkaline Phosphatase: 88 U/L (ref 38–126)
Anion gap: 13 (ref 5–15)
BUN: 15 mg/dL (ref 8–23)
CO2: 26 mmol/L (ref 22–32)
Calcium: 9.5 mg/dL (ref 8.9–10.3)
Chloride: 97 mmol/L — ABNORMAL LOW (ref 98–111)
Creatinine, Ser: 0.99 mg/dL (ref 0.61–1.24)
GFR, Estimated: >60 mL/min (ref >60)
Glucose, Bld: 112 mg/dL — ABNORMAL HIGH (ref 70–99)
Potassium: 3.4 mmol/L — ABNORMAL LOW (ref 3.5–5.1)
Sodium: 136 mmol/L (ref 135–145)
Total Bilirubin: 1.5 mg/dL — ABNORMAL HIGH (ref 0.3–1.2)
Total Protein: 7.1 g/dL (ref 6.5–8.1)

## 2021-06-29 LAB — CBC WITH DIFFERENTIAL/PLATELET
Abs Immature Granulocytes: 0.05 10*3/uL (ref 0.00–0.07)
Basophils Absolute: 0 10*3/uL (ref 0.0–0.1)
Basophils Relative: 0 %
Eosinophils Absolute: 0.2 10*3/uL (ref 0.0–0.5)
Eosinophils Relative: 2 %
HCT: 49.7 % (ref 39.0–52.0)
Hemoglobin: 16.9 g/dL (ref 13.0–17.0)
Immature Granulocytes: 1 %
Lymphocytes Relative: 16 %
Lymphs Abs: 1.5 10*3/uL (ref 0.7–4.0)
MCH: 30.5 pg (ref 26.0–34.0)
MCHC: 34 g/dL (ref 30.0–36.0)
MCV: 89.5 fL (ref 80.0–100.0)
Monocytes Absolute: 0.6 10*3/uL (ref 0.1–1.0)
Monocytes Relative: 6 %
Neutro Abs: 7.3 10*3/uL (ref 1.7–7.7)
Neutrophils Relative %: 75 %
Platelets: 261 10*3/uL (ref 150–400)
RBC: 5.55 MIL/uL (ref 4.22–5.81)
RDW: 13.6 % (ref 11.5–15.5)
WBC: 9.7 10*3/uL (ref 4.0–10.5)
nRBC: 0 % (ref 0.0–0.2)

## 2021-06-29 LAB — LIPASE, BLOOD: Lipase: 22 U/L (ref 11–51)

## 2021-06-29 LAB — URINALYSIS, ROUTINE W REFLEX MICROSCOPIC
Bilirubin Urine: NEGATIVE
Glucose, UA: NEGATIVE mg/dL
Hgb urine dipstick: NEGATIVE
Ketones, ur: NEGATIVE mg/dL
Leukocytes,Ua: NEGATIVE
Nitrite: NEGATIVE
Protein, ur: NEGATIVE mg/dL
Specific Gravity, Urine: 1.011 (ref 1.005–1.030)
pH: 8 (ref 5.0–8.0)

## 2021-06-29 MED ORDER — NYSTATIN 100000 UNIT/GM EX OINT: 1.0000 "application " | TOPICAL_OINTMENT | Freq: Two times a day (BID) | CUTANEOUS | 1 refills | Status: DC

## 2021-06-29 MED ORDER — NYSTATIN 100000 UNIT/GM EX OINT
TOPICAL_OINTMENT | Freq: Two times a day (BID) | CUTANEOUS | Status: DC
Start: 2021-06-29 — End: 2021-06-29
  Administered 2021-06-29: 1 via TOPICAL
  Filled 2021-06-29: qty 15

## 2021-06-29 MED ORDER — ACETAMINOPHEN 500 MG PO TABS
1000.0000 mg | ORAL_TABLET | Freq: Once | ORAL | Status: AC
  Administered 2021-06-29: 1000 mg via ORAL
  Filled 2021-06-29: qty 2

## 2021-06-29 NOTE — ED Notes (Signed)
Patient currently resting quietly in room with eyes closed.  Respirations even and unlabored

## 2021-06-29 NOTE — ED Notes (Signed)
RN attempted to call patients wife son Camillia Herter with no response.

## 2021-06-29 NOTE — ED Notes (Addendum)
RN went over discharge instructions with Son and wife. Son understood instructions. RN reiterated to son and wife of patient that patient needs to f/u with urology as well as get prescriptions filled. RN also informed son and wife about maintaining indwelling foley catheter of patient. Son verbalized understanding for management of patient. RN also supplied pt family with standard drainage bags for management of foley.

## 2021-06-29 NOTE — ED Provider Notes (Signed)
[facility] EMERGENCY DEPARTMENT Provider Note  CSN: [id number] Arrival date & time: 06/28/21 2257  Chief Complaint(s) Abdominal Pain  HPI Jeffrey Zimmerman is a 85 y.o. male with a past medical history listed below here for abdominal distention and pain.  Patient was seen earlier in the month for constipation and noted to have a distended bladder due to prostamegaly requiring Foley placement.  Patient does not remember when the Foley was removed but believes it was sometime this week.  States that he was voiding fine up until yesterday.  He endorses burning with voiding.  He also endorses burning sensation from his scrotum down to his perianal region.  Denies any fevers or chills.  No nausea or vomiting.  Patient had recent basal cell carcinoma excision of the right face on 12/15 has been doing well from this.  HPI   Past Medical History Past Medical History:  Diagnosis Date   Adenomatous colon polyp 08/2008   Arthritis    Basal cell carcinoma    Cancer ([place])    colon   Coronary artery disease    04/21/10 Anter MI with BMS to LAD with residual proximal LAD EF 40%   Gout    History of cholecystectomy    History of colonoscopy    1/5 AND 2/10   Hypertension    Irritable bowel syndrome    Myocardial infarct [facility])    SVT (supraventricular tachycardia) [facility])    Patient Active Problem List   Diagnosis Date Noted   Basal cell carcinoma (BCC) 06/23/2021   Bilateral lower extremity edema 04/10/2016   Old myocardial infarction 08/10/2013   Mixed hyperlipidemia 08/10/2013   Other malaise and fatigue 08/08/2013   Hypertension    Coronary artery disease    Cancer (H[facility])    Basal cell carcinoma    Gout    Irritable bowel syndrome    SVT (supraventricular tachycardia) ([place])    Home Medication(s) Prior to Admission medications   Medication Sig Start Date End Date Taking? Authorizing Provider  nystatin ointment (MYCOSTATIN) Apply 1 application topically 2 (two)  times daily. 06/29/21  Yes Jolin Benavides, Grayce Sessions, MD  amLODipine (NORVASC) 5 MG tablet TAKE 1 TABLET BY MOUTH DAILY Patient not taking: Reported on 06/18/2021 03/28/19   [name], MD  Cholecalciferol (VITAMIN D3) 50 MCG (2000 UT) TABS Take 2,000 Units by mouth in the morning.    [provider]  clopidogrel (PLAVIX) 75 MG tablet Take 1 tablet (75 mg total) by mouth daily. Patient not taking: Reported on 06/18/2021 07/05/20   [name], MD  fluticasone [facility] SENSIMIST) 27.5 MCG/SPRAY nasal spray Place 1 spray into the nose daily.    [provider]  lactase (LACTAID) 3000 units tablet Take 3,000-6,000 Units by mouth 3 (three) times daily with meals.    [provider]  lisinopril-hydrochlorothiazide (ZESTORETIC) 20-25 MG tablet Take 1 tablet by mouth daily.    [provider]  Loperamide-Simethicone 2-125 MG TABS Take 1 tablet by mouth 3 (three) times daily before meals. TAKE 45 MINUTES BEFORE MEALS    [provider]  metoprolol tartrate (LOPRESSOR) 25 MG tablet TAKE 1/2 TABLET BY MOUTH TWICE A DAY 10/09/20   Belva Crome, MD  nitroGLYCERIN (NITROSTAT) 0.4 MG SL tablet Place 1 tablet (0.4 mg total) under the tongue every 5 (five) minutes as needed for chest pain. Patient not taking: Reported on 06/18/2021 05/13/16   [name], MD  ondansetron [facility]) 4 MG tablet Take  1 tablet (4 mg total) by mouth every 8 (eight) hours as needed for nausea or vomiting. 06/23/21   Scheeler, Carola Rhine, PA-C  polyethylene glycol (MIRALAX) 17 g packet Take 17 g by mouth daily. 06/21/21 07/21/21  Idamae Lusher, MD  potassium chloride (KLOR-CON) 10 MEQ tablet Take 1 tablet (10 mEq total) by mouth 2 (two) times daily. 06/19/21   Lacretia Leigh, MD  pravastatin (PRAVACHOL) 20 MG tablet TAKE 1 TABLET (20 MG TOTAL) BY MOUTH DAILY. PLEASE SCHEDULE APPOINTMENT FOR FUTURE REFILLS. 08/07/20   [name], MD  tamsulosin (FLOMAX) 0.4 MG CAPS  capsule Take 0.8 mg by mouth at bedtime. 11/19/19   [provider]                                                                                                                                    Past Surgical History Past Surgical History:  Procedure Laterality Date   ADJACENT TISSUE TRANSFER/TISSUE REARRANGEMENT Right 06/23/2021   Procedure: ADJACENT TISSUE TRANSFER/TISSUE REARRANGEMENT;  Surgeon: Cindra Presume, MD;  Location: [place];  Service: Plastics;  Laterality: Right;   APPENDECTOMY     CARDIAC CATHETERIZATION     04/21/10 Anter MI with BMS to LAD with residual proximal LAD EF 40%   CHOLECYSTECTOMY     pt does not remember when   [place] Right 06/23/2021   Procedure: Right cheek reconstruction;  Surgeon: Cindra Presume, MD;  Location: [place];  Service: Plastics;  Laterality: Right;   KNEE ARTHOSCOPY Bilateral    PAROTIDECTOMY N/A 06/23/2021   Procedure: PAROTIDECTOMY  RIGHT SUPERFICIAL WITH FACIAL NERVE DISSECTION;  Surgeon: Izora Gala, MD;  Location: [place];  Service: ENT;  Laterality: N/A;   PARTIAL LEFT NEPHRECTOMY     (for oncocytoma-benign)   Family History Family History  Problem Relation Age of Onset   Heart attack Mother    CAD Mother    Colon cancer Father    Rheum arthritis Maternal Grandfather     Social History Social History   Tobacco Use   Smoking status: Never   Smokeless tobacco: Never  Vaping Use   Vaping Use: Never used  Substance Use Topics   Alcohol use: No   Drug use: No   Allergies Codeine  Review of Systems Review of Systems All other systems are reviewed and are negative for acute change except as noted in the HPI  Physical Exam Vital Signs  I have reviewed the triage vital signs BP 110/81   Pulse (!) 102   Temp 98.3 F (36.8 C) (Oral)   Resp (!) 26   SpO2 98%   Physical Exam Vitals reviewed.  Constitutional:      General: He is not in acute distress.    Appearance: He is well-developed. He is  not diaphoretic.  HENT:     Head: Normocephalic and atraumatic.      Right Ear: External  ear normal.     Left Ear: External ear normal.     Nose: Nose normal.     Mouth/Throat:     Mouth: Mucous membranes are moist.  Eyes:     General: No scleral icterus.    Conjunctiva/sclera: Conjunctivae normal.  Neck:     Trachea: Phonation normal.  Cardiovascular:     Rate and Rhythm: Normal rate and regular rhythm.  Pulmonary:     Effort: Pulmonary effort is normal. No respiratory distress.     Breath sounds: No stridor.  Abdominal:     General: There is distension.     Tenderness: There is abdominal tenderness in the suprapubic area. There is no guarding or rebound.  Genitourinary:    Pubic Area: Rash (erythematous with satelite lesions) present.  Musculoskeletal:        General: Normal range of motion.     Cervical back: Normal range of motion.  Neurological:     Mental Status: He is alert and oriented to person, place, and time.  Psychiatric:        Behavior: Behavior normal.    ED Results and Treatments Labs (all labs ordered are listed, but only abnormal results are displayed) Labs Reviewed  COMPREHENSIVE METABOLIC PANEL - Abnormal; Notable for the following components:      Result Value   Potassium 3.4 (*)    Glucose, Bld 112 (*)    AST 53 (*)    Total Bilirubin 1.5 (*)    All other components within normal limits  URINALYSIS, ROUTINE W REFLEX MICROSCOPIC  CBC WITH DIFFERENTIAL/PLATELET  LIPASE, BLOOD                                                                                                                         EKG  EKG Interpretation  Date/Time:  Sunday June 29 2021 00:31:15 EST Ventricular Rate:  95 PR Interval:  189 QRS Duration: 85 QT Interval:  350 QTC Calculation: 440 R Axis:   -48 Text Interpretation: Sinus rhythm LAE, consider biatrial enlargement Abnormal R-wave progression, early transition Confirmed by Addison Lank [phone]) on 06/29/2021  2:40:26 AM       Radiology DG Abdomen Acute W/Chest  Result Date: 06/29/2021 CLINICAL DATA:  Abdominal discomfort. EXAM: DG ABDOMEN ACUTE WITH 1 VIEW CHEST COMPARISON:  06/19/2021. FINDINGS: There is no evidence of dilated bowel loops or free intraperitoneal air. A vague calcified lesion is again noted in the upper pole the left kidney, unchanged from the prior exam. Surgical clips are noted in the right upper quadrant. Heart size and mediastinal contours are within normal limits. Atherosclerotic calcification of the aorta is noted. Both lungs are clear. There are degenerative changes in the thoracolumbar spine. IMPRESSION: 1. No acute cardiopulmonary process. 2. No evidence of bowel obstruction or free air. 3. Vague calcified lesion in the upper pole the left kidney, unchanged from the previous exam. . Electronically Signed   By: Brett Fairy M.D.   On: 06/29/2021 02:04  Pertinent labs & imaging results that were available during my care of the patient were reviewed by me and considered in my medical decision making (see MDM for details).  Medications Ordered in ED Medications  nystatin ointment (MYCOSTATIN) (1 application Topical Given 06/29/21 0152)  acetaminophen (TYLENOL) tablet 1,000 mg (1,000 mg Oral Given 06/29/21 0152)                                                                                                                                     Procedures Procedures  (including critical care time)  Medical Decision Making / ED Course I have reviewed the nursing notes for this encounter and the patient's prior records (if available in EHR or on provided paperwork).  Jeffrey Zimmerman was evaluated in Emergency Department on 06/29/2021 for the symptoms described in the history of present illness. He was evaluated in the context of the global COVID-19 pandemic, which necessitated consideration that the patient might be at risk for infection with the SARS-CoV-2 virus that causes  COVID-19. Institutional protocols and algorithms that pertain to the evaluation of patients at risk for COVID-19 are in a state of rapid change based on information released by regulatory bodies including the CDC and federal and state organizations. These policies and algorithms were followed during the patient's care in the ED.     Abdominal pain and distention. On exam patient has distended abdomen with suprapubic pain concerning for urinary retention. Bladder scan confirmed greater than 800 cc in the bladder Foley inserted. 1200cc obtained UA sent and negative for infection CBC without leukocytosis or anemia.  No significant electrolyte derangements or renal sufficiency. KUB obtained and negative for abnormal bowel patterns concerning for obstruction. Doubt other serious intra-abdominal inflammatory/infectious process requiring additional imaging at this time. Patient will need to follow-up with urology once more.  Groin pain Exam is consistent with tinea cruris. Nystatin ointment applied. Will need to follow-up with PCP.  Face surgery Well-appearing surgical site. We bandaged.  Pertinent labs & imaging results that were available during my care of the patient were reviewed by me and considered in my medical decision making:    Final Clinical Impression(s) / ED Diagnoses Final diagnoses:  Urinary retention  Tinea cruris    The patient appears reasonably screened and/or stabilized for discharge and I doubt any other medical condition or other [facility] requiring further screening, evaluation, or treatment in the ED at this time prior to discharge. Safe for discharge with strict return precautions.  Disposition: Discharge  Condition: Good  I have discussed the results, Dx and Tx plan with the patient/family who expressed understanding and agree(s) with the plan. Discharge instructions discussed at length. The patient/family was given strict return precautions who verbalized  understanding of the instructions. No further questions at time of discharge.    ED Discharge Orders          Ordered    nystatin ointment (  MYCOSTATIN)  2 times daily        06/29/21 0249             Follow Up: [place] [phone] Call  to schedule an appointment for close follow up    This chart was dictated using voice recognition software.  Despite best efforts to proofread,  errors can occur which can change the documentation meaning.    Fatima Blank, MD 06/29/21 [phone]

## 2021-06-29 NOTE — ED Notes (Signed)
Patient reports he needs transportation home.  Has used [facility] and taxi on prior visits.  Patient is current with [facility].  Attempted to call office and office is currently close.  Patient has a noted Education officer, museum from [employer].  Attempted call with no answer.  Pt reports "Olivia Mackie will not be in the office until 9."  Called wife with no answer.   Pt states "I can go by myself if the door is unlocked, but I dont know if it will be."  Charge RN made aware

## 2021-06-29 NOTE — ED Notes (Signed)
RN attempted to contact patients apartment manager to open door with no answer.

## 2021-07-01 ENCOUNTER — Other Ambulatory Visit: Payer: Self-pay

## 2021-07-01 ENCOUNTER — Emergency Department (HOSPITAL_COMMUNITY)
Admission: EM | Admit: 2021-07-01 | Discharge: 2021-07-02 | Disposition: A | Discharge status: Home / Self Care | Payer: Medicare (Managed Care) | Attending: Emergency Medicine | Admitting: Emergency Medicine

## 2021-07-01 ENCOUNTER — Encounter (HOSPITAL_COMMUNITY): Payer: Self-pay

## 2021-07-01 ENCOUNTER — Emergency Department (HOSPITAL_COMMUNITY): Payer: Medicare (Managed Care)

## 2021-07-01 DIAGNOSIS — K5641 Fecal impaction: Secondary | ICD-10-CM | POA: Diagnosis present

## 2021-07-01 DIAGNOSIS — E876 Hypokalemia: Secondary | ICD-10-CM | POA: Diagnosis not present

## 2021-07-01 DIAGNOSIS — Z85828 Personal history of other malignant neoplasm of skin: Secondary | ICD-10-CM | POA: Insufficient documentation

## 2021-07-01 DIAGNOSIS — I2510 Atherosclerotic heart disease of native coronary artery without angina pectoris: Secondary | ICD-10-CM | POA: Diagnosis not present

## 2021-07-01 DIAGNOSIS — Z7902 Long term (current) use of antithrombotics/antiplatelets: Secondary | ICD-10-CM | POA: Diagnosis not present

## 2021-07-01 DIAGNOSIS — I10 Essential (primary) hypertension: Secondary | ICD-10-CM | POA: Insufficient documentation

## 2021-07-01 DIAGNOSIS — Z85038 Personal history of other malignant neoplasm of large intestine: Secondary | ICD-10-CM | POA: Insufficient documentation

## 2021-07-01 DIAGNOSIS — Z79899 Other long term (current) drug therapy: Secondary | ICD-10-CM | POA: Diagnosis not present

## 2021-07-01 LAB — LIPASE, BLOOD: Lipase: 22 U/L (ref 11–51)

## 2021-07-01 LAB — CBC WITH DIFFERENTIAL/PLATELET
Abs Immature Granulocytes: 0.05 10*3/uL (ref 0.00–0.07)
Basophils Absolute: 0 10*3/uL (ref 0.0–0.1)
Basophils Relative: 0 %
Eosinophils Absolute: 0.1 10*3/uL (ref 0.0–0.5)
Eosinophils Relative: 2 %
HCT: 45.6 % (ref 39.0–52.0)
Hemoglobin: 15.6 g/dL (ref 13.0–17.0)
Immature Granulocytes: 1 %
Lymphocytes Relative: 11 %
Lymphs Abs: 0.9 10*3/uL (ref 0.7–4.0)
MCH: 30.1 pg (ref 26.0–34.0)
MCHC: 34.2 g/dL (ref 30.0–36.0)
MCV: 87.9 fL (ref 80.0–100.0)
Monocytes Absolute: 0.6 10*3/uL (ref 0.1–1.0)
Monocytes Relative: 7 %
Neutro Abs: 6.4 10*3/uL (ref 1.7–7.7)
Neutrophils Relative %: 79 %
Platelets: 220 10*3/uL (ref 150–400)
RBC: 5.19 MIL/uL (ref 4.22–5.81)
RDW: 13.6 % (ref 11.5–15.5)
WBC: 8.1 10*3/uL (ref 4.0–10.5)
nRBC: 0 % (ref 0.0–0.2)

## 2021-07-01 LAB — COMPREHENSIVE METABOLIC PANEL
ALT: 41 U/L (ref 0–44)
AST: 37 U/L (ref 15–41)
Albumin: 3.5 g/dL (ref 3.5–5.0)
Alkaline Phosphatase: 89 U/L (ref 38–126)
Anion gap: 13 (ref 5–15)
BUN: 22 mg/dL (ref 8–23)
CO2: 23 mmol/L (ref 22–32)
Calcium: 8.8 mg/dL — ABNORMAL LOW (ref 8.9–10.3)
Chloride: 100 mmol/L (ref 98–111)
Creatinine, Ser: 1.17 mg/dL (ref 0.61–1.24)
GFR, Estimated: >60 mL/min (ref >60)
Glucose, Bld: 116 mg/dL — ABNORMAL HIGH (ref 70–99)
Potassium: 3.1 mmol/L — ABNORMAL LOW (ref 3.5–5.1)
Sodium: 136 mmol/L (ref 135–145)
Total Bilirubin: 2.1 mg/dL — ABNORMAL HIGH (ref 0.3–1.2)
Total Protein: 6.5 g/dL (ref 6.5–8.1)

## 2021-07-01 LAB — MAGNESIUM: Magnesium: 2.1 mg/dL (ref 1.7–2.4)

## 2021-07-01 MED ORDER — LORAZEPAM 2 MG/ML IJ SOLN
0.5000 mg | Freq: Once | INTRAMUSCULAR | Status: AC
  Administered 2021-07-01: 0.5 mg via INTRAVENOUS
  Filled 2021-07-01: qty 1

## 2021-07-01 MED ORDER — POTASSIUM CHLORIDE 10 MEQ/100ML IV SOLN
10.0000 meq | INTRAVENOUS | Status: AC
  Administered 2021-07-01 (×3): 10 meq via INTRAVENOUS
  Filled 2021-07-01 (×3): qty 100

## 2021-07-01 MED ORDER — FENTANYL CITRATE PF 50 MCG/ML IJ SOSY
50.0000 ug | PREFILLED_SYRINGE | Freq: Once | INTRAMUSCULAR | Status: AC
  Administered 2021-07-01: 50 ug via INTRAVENOUS
  Filled 2021-07-01: qty 1

## 2021-07-01 MED ORDER — SORBITOL 70 % SOLN
960.0000 mL | TOPICAL_OIL | Freq: Once | ORAL | Status: AC
  Administered 2021-07-01: 960 mL via RECTAL
  Filled 2021-07-01: qty 473

## 2021-07-01 MED ORDER — LIDOCAINE HCL URETHRAL/MUCOSAL 2 % EX GEL
1.0000 "application " | Freq: Once | CUTANEOUS | Status: AC
  Administered 2021-07-01: 1
  Filled 2021-07-01: qty 11

## 2021-07-01 MED ORDER — IOHEXOL 300 MG/ML  SOLN
80.0000 mL | Freq: Once | INTRAMUSCULAR | Status: AC | PRN
  Administered 2021-07-01: 80 mL via INTRAVENOUS

## 2021-07-01 NOTE — ED Provider Notes (Signed)
[facility] EMERGENCY DEPARTMENT Provider Note   CSN: [id number] Arrival date & time: 07/01/21  0557     History Chief Complaint  Patient presents with   Constipation    Jeffrey Zimmerman is a 85 y.o. male.  HPI He presents for evaluation of constipation with a sensation of rectal mass.  Unclear when last bowel movement was.  He has been treated for facial cancer.  He denies other problems including abdominal pain, vomiting, dizziness or weakness.  He was evaluated in the ED, 3 days ago for urinary retention.  At that time he had plain imaging done, rectal exam was not performed.     Past Medical History:  Diagnosis Date   Adenomatous colon polyp 08/2008   Arthritis    Basal cell carcinoma    Cancer (HCC)    colon   Coronary artery disease    04/21/10 Anter MI with BMS to LAD with residual proximal LAD EF 40%   Gout    History of cholecystectomy    History of colonoscopy    1/5 AND 2/10   Hypertension    Irritable bowel syndrome    Myocardial infarct [facility])    SVT (supraventricular tachycardia) [facility])     Patient Active Problem List   Diagnosis Date Noted   Basal cell carcinoma (BCC) 06/23/2021   Bilateral lower extremity edema 04/10/2016   Old myocardial infarction 08/10/2013   Mixed hyperlipidemia 08/10/2013   Other malaise and fatigue 08/08/2013   Hypertension    Coronary artery disease    Cancer (HCC)    Basal cell carcinoma    Gout    Irritable bowel syndrome    SVT (supraventricular tachycardia) ([place])     Past Surgical History:  Procedure Laterality Date   ADJACENT TISSUE TRANSFER/TISSUE REARRANGEMENT Right 06/23/2021   Procedure: ADJACENT TISSUE TRANSFER/TISSUE REARRANGEMENT;  Surgeon: Cindra Presume, MD;  Location: [place];  Service: Plastics;  Laterality: Right;   APPENDECTOMY     CARDIAC CATHETERIZATION     04/21/10 Anter MI with BMS to LAD with residual proximal LAD EF 40%   CHOLECYSTECTOMY     pt does not remember when   [place] Right 06/23/2021   Procedure: Right cheek reconstruction;  Surgeon: Cindra Presume, MD;  Location: [place];  Service: Plastics;  Laterality: Right;   KNEE ARTHOSCOPY Bilateral    PAROTIDECTOMY N/A 06/23/2021   Procedure: PAROTIDECTOMY  RIGHT SUPERFICIAL WITH FACIAL NERVE DISSECTION;  Surgeon: Izora Gala, MD;  Location: [employer];  Service: ENT;  Laterality: N/A;   PARTIAL LEFT NEPHRECTOMY     (for oncocytoma-benign)       Family History  Problem Relation Age of Onset   Heart attack Mother    CAD Mother    Colon cancer Father    Rheum arthritis Maternal Grandfather     Social History   Tobacco Use   Smoking status: Never   Smokeless tobacco: Never  Vaping Use   Vaping Use: Never used  Substance Use Topics   Alcohol use: No   Drug use: No    Home Medications Prior to Admission medications   Medication Sig Start Date End Date Taking? Authorizing Provider  amLODipine (NORVASC) 5 MG tablet TAKE 1 TABLET BY MOUTH DAILY Patient not taking: Reported on 06/18/2021 03/28/19   [name], MD  Cholecalciferol (VITAMIN D3) 50 MCG (2000 UT) TABS Take 2,000 Units by mouth in the morning.    [provider]  clopidogrel (PLAVIX) 75 MG tablet Take 1 tablet (75 mg total) by mouth daily. Patient not taking: Reported on 06/18/2021 07/05/20   [name], MD  fluticasone [facility] SENSIMIST) 27.5 MCG/SPRAY nasal spray Place 1 spray into the nose daily.    [provider]  lactase (LACTAID) 3000 units tablet Take 3,000-6,000 Units by mouth 3 (three) times daily with meals.    [provider]  lisinopril-hydrochlorothiazide (ZESTORETIC) 20-25 MG tablet Take 1 tablet by mouth daily.    [provider]  Loperamide-Simethicone 2-125 MG TABS Take 1 tablet by mouth 3 (three) times daily before meals. TAKE 45 MINUTES BEFORE MEALS    [provider]  metoprolol tartrate (LOPRESSOR) 25 MG tablet TAKE 1/2 TABLET BY MOUTH TWICE A DAY  10/09/20   Belva Crome, MD  nitroGLYCERIN (NITROSTAT) 0.4 MG SL tablet Place 1 tablet (0.4 mg total) under the tongue every 5 (five) minutes as needed for chest pain. Patient not taking: Reported on 06/18/2021 05/13/16   [name], MD  nystatin ointment (MYCOSTATIN) Apply 1 application topically 2 (two) times daily. 06/29/21   Fatima Blank, MD  ondansetron (ZOFRAN) 4 MG tablet Take 1 tablet (4 mg total) by mouth every 8 (eight) hours as needed for nausea or vomiting. 06/23/21   Scheeler, Carola Rhine, PA-C  polyethylene glycol (MIRALAX) 17 g packet Take 17 g by mouth daily. 06/21/21 07/21/21  Idamae Lusher, MD  potassium chloride (KLOR-CON) 10 MEQ tablet Take 1 tablet (10 mEq total) by mouth 2 (two) times daily. 06/19/21   Lacretia Leigh, MD  pravastatin (PRAVACHOL) 20 MG tablet TAKE 1 TABLET (20 MG TOTAL) BY MOUTH DAILY. PLEASE SCHEDULE APPOINTMENT FOR FUTURE REFILLS. 08/07/20   [name], MD  tamsulosin (FLOMAX) 0.4 MG CAPS capsule Take 0.8 mg by mouth at bedtime. 11/19/19   [provider]    Allergies    Codeine  Review of Systems   Review of Systems  All other systems reviewed and are negative.  Physical Exam Updated Vital Signs BP 134/61   Pulse 85   Temp 98.1 F (36.7 C) (Oral)   Resp (!) 25   Ht 5\' 7"  (1.702 m)   Wt 85.3 kg   SpO2 97%   BMI 29.45 kg/m   Physical Exam Vitals and nursing note reviewed.  Constitutional:      General: He is in acute distress (Uncomfortable).     Appearance: He is well-developed. He is not ill-appearing.  HENT:     Head: Normocephalic and atraumatic.     Right Ear: External ear normal.     Left Ear: External ear normal.  Eyes:     Conjunctiva/sclera: Conjunctivae normal.     Pupils: Pupils are equal, round, and reactive to light.  Neck:     Trachea: Phonation normal.  Cardiovascular:     Rate and Rhythm: Normal rate and regular rhythm.     Heart sounds: Normal heart sounds.  Pulmonary:     Effort:  Pulmonary effort is normal.     Breath sounds: Normal breath sounds.  Abdominal:     General: There is no distension.     Palpations: Abdomen is soft.     Tenderness: There is no abdominal tenderness.  Genitourinary:    Comments: At the time of initial evaluation there was a large ball of feces protruding from the anus.  I assessed the patient to evacuate this and he was able to expel a fecal mass approximately 8 cm  long, and 5 cm in diameter.  Subsequent to that there was still palpable fecal impaction in the rectum.  Patient had extreme pain with this bowel movement. Musculoskeletal:        General: Normal range of motion.     Cervical back: Normal range of motion and neck supple.  Skin:    General: Skin is warm and dry.  Neurological:     Mental Status: He is alert and oriented to person, place, and time.     Cranial Nerves: No cranial nerve deficit.     Sensory: No sensory deficit.     Motor: No abnormal muscle tone.     Coordination: Coordination normal.  Psychiatric:        Mood and Affect: Mood normal.        Behavior: Behavior normal.        Thought Content: Thought content normal.    ED Results / Procedures / Treatments   Labs (all labs ordered are listed, but only abnormal results are displayed) Labs Reviewed  COMPREHENSIVE METABOLIC PANEL - Abnormal; Notable for the following components:      Result Value   Potassium 3.1 (*)    Glucose, Bld 116 (*)    Calcium 8.8 (*)    Total Bilirubin 2.1 (*)    All other components within normal limits  CBC WITH DIFFERENTIAL/PLATELET  LIPASE, BLOOD  MAGNESIUM    EKG None  Radiology CT ABDOMEN PELVIS W CONTRAST  Result Date: 07/01/2021 CLINICAL DATA:  Abdominal pain EXAM: CT ABDOMEN AND PELVIS WITH CONTRAST TECHNIQUE: Multidetector CT imaging of the abdomen and pelvis was performed using the standard protocol following bolus administration of intravenous contrast. CONTRAST:  70mL OMNIPAQUE IOHEXOL 300 MG/ML  SOLN  COMPARISON:  CT abdomen and pelvis dated June 19, 2021 FINDINGS: Lower chest: Scarring of the left ventricular apex, likely due to prior LAD infarct. Calcifications of the LAD and RCA. Bibasilar atelectasis. Hepatobiliary: No suspicious liver lesions. Cholecystectomy clips. Mild biliary ductal dilation, common bile duct measures up to 8 mm, not unexpected status post cholecystectomy. Pancreas: Unchanged cystic lesion at the junction of the body and tail of the pancreas measuring up to 2.0 cm. Spleen: Peripherally enhancing lesions of the posterior spleen, unchanged compared to priors and likely splenic hemangiomas. Adrenals/Urinary Tract: Bilateral adrenal glands are unremarkable. Kidneys enhance symmetrically with no evidence of hydronephrosis. Unchanged calcified lesion of the lower pole of the left kidney. Urinary bladder is thick-walled and decompressed and contains a Foley catheter. Stomach/Bowel: Stomach is within normal limits. Appendix is not visualized. No evidence of bowel wall thickening, distention, or inflammatory changes. Vascular/Lymphatic: Aortic atherosclerosis. Unchanged ectasia of the celiac artery. No enlarged abdominal or pelvic lymph nodes. Reproductive: Marked prostatomegaly, measuring up to 7.8 cm. Other: No abdominal wall hernia or abnormality. No abdominopelvic ascites. Musculoskeletal: Grade 1 anterolisthesis of L5 on S1, unchanged compared to prior exam. No acute or significant osseous findings. IMPRESSION: 1. No acute findings in the abdomen or pelvis. 2. Aortic Atherosclerosis (ICD10-I70.0) and Emphysema (ICD10-J43.9). Unchanged ectasia of the celiac artery. Electronically Signed   By: Yetta Glassman M.D.   On: 07/01/2021 09:37    Procedures Procedures   Medications Ordered in ED Medications  potassium chloride 10 mEq in 100 mL IVPB (has no administration in time range)  sorbitol, milk of mag, mineral oil, glycerin (SMOG) enema (has no administration in time range)   iohexol (OMNIPAQUE) 300 MG/ML solution 80 mL (80 mLs Intravenous Contrast Given 07/01/21 0919)  fentaNYL (SUBLIMAZE)  injection 50 mcg (50 mcg Intravenous Given 07/01/21 0934)  LORazepam (ATIVAN) injection 0.5 mg (0.5 mg Intravenous Given 07/01/21 0935)  lidocaine (XYLOCAINE) 2 % jelly 1 application (1 application Other Given 07/01/21 1119)  fentaNYL (SUBLIMAZE) injection 50 mcg (50 mcg Intravenous Given 07/01/21 1633)  LORazepam (ATIVAN) injection 0.5 mg (0.5 mg Intravenous Given 07/01/21 1633)    ED Course  I have reviewed the triage vital signs and the nursing notes.  Pertinent labs & imaging results that were available during my care of the patient were reviewed by me and considered in my medical decision making (see chart for details).  Clinical Course as of 07/01/21 1659  Tue Jul 01, 2021  1619 Patient's wife is here and gives additional history.  She is having trouble managing him at home.  Her contact numbers are listed in the EMR.  She does not speak [place].  Her son's contact is also listed.  His correct first name is '[place].' [EW]  1635 Perform second fecal disimpaction at this time recovering about 5 x 5 cm volume of stool. [EW]    Clinical Course User Index [EW] Daleen Bo, MD   MDM Rules/Calculators/A&P                            Patient Vitals for the past 24 hrs:  BP Temp Temp src Pulse Resp SpO2 Height Weight  07/01/21 1600 134/61 -- -- 85 (!) 25 97 % -- --  07/01/21 1400 139/79 -- -- 87 17 95 % -- --  07/01/21 1300 127/66 -- -- 81 (!) 24 97 % -- --  07/01/21 1230 123/67 -- -- 73 18 95 % -- --  07/01/21 1200 95/84 -- -- 83 20 97 % -- --  07/01/21 1100 137/67 -- -- 94 (!) 21 99 % -- --  07/01/21 1030 120/65 -- -- 74 19 94 % -- --  07/01/21 1015 114/67 -- -- 71 16 94 % -- --  07/01/21 0830 (!) 146/87 -- -- 99 20 97 % -- --  07/01/21 0809 (!) 150/88 -- -- 95 (!) 21 97 % -- --  07/01/21 0605 (!) 134/101 98.1 F (36.7 C) Oral (!) 121 -- 99 % -- --  07/01/21  0603 -- -- -- -- -- -- 5\' 7"  (1.702 m) 85.3 kg  07/01/21 0600 -- -- -- -- -- 97 % -- --     Medical Decision Making:  This patient is presenting for evaluation of constipation and fecal impaction, which does require a range of treatment options, and is a complaint that involves a moderate risk of morbidity and mortality. The differential diagnoses include constipation, fecal impaction. I decided to review old records, and in summary elderly male presenting with constipation and fecal impaction.  I obtained additional historical information from wife at bedside.  Clinical Laboratory Tests Ordered, included CBC, Metabolic panel, and magnesium, lipase . Review indicates potassium low. Radiologic Tests Ordered, included CT abdomen and pelvis.  I independently Visualized: No acute abnormalities images, which show no acute abnormalities    Critical Interventions-clinical evaluation, laboratory testing, radiography, medication treatment, analgesia treatment, hypokalemia treatment, fecal disimpaction x2, enema ordered  After These Interventions, the Patient was reevaluated and was found constipation and fecal impaction with likely secondary hypokalemia.  Patient did not clear stool for disimpaction.  He required enema.  His provider ([facility]) was contacted by [facility] staff, and they are committed to getting him placed for higher level care.  They will do that from home.  CRITICAL CARE-no Performed by: Daleen Bo  Nursing Notes Reviewed/ Care Coordinated Applicable Imaging Reviewed Interpretation of Laboratory Data incorporated into ED treatment  Disposition after enema.  Patient to be discharged home if he has good clearance of stool from rectum.  He will need to be admitted if he does not have a high-volume stool release after enema.    Final Clinical Impression(s) / ED Diagnoses Final diagnoses:  Fecal impaction ([place])  Hypokalemia    Rx / DC Orders ED Discharge Orders     None         Daleen Bo, MD 07/01/21 1659

## 2021-07-01 NOTE — ED Triage Notes (Signed)
Pt presents to the ED from home via [facility] with complaints of constipation x1 week. Pt states "I have a ball of shit stuck." Pt denies N/V. Low abd pain, chronic foley in place.

## 2021-07-01 NOTE — ED Notes (Signed)
Pt tolerated enema poorly. Two large and hard piece of BM came out and some gas.

## 2021-07-01 NOTE — Discharge Instructions (Signed)
Make sure to have your doctor recheck your potassium level

## 2021-07-01 NOTE — Discharge Planning (Signed)
RNCM spoke with Janette at [facility] to advise pt. will be discharged. Janette advised pt's family maybe able to pick up if needed.

## 2021-07-01 NOTE — ED Provider Notes (Signed)
Emergency Medicine Provider Triage Evaluation Note  Jeffrey Zimmerman , a 85 y.o. male  was evaluated in triage.  Pt complains of abdominal pain worse to LLQ, constipation.  States he cannot remember the last time he had a bowel movement.  He is not passing flatus.  He has diffuse abdominal pain.  Feels occasionally nauseous without any vomiting.  States he has bowel cancer.  Not on chemotherapy.  Recently had skin cancer removed on her right side of face without any complaints.  He has a chronic indwelling Foley for hematuria, denies any suprapubic pain  Review of Systems  Positive: Abdominal pain, constipation Negative: Fever, emesis, back pain  Physical Exam  SpO2 97%  Gen:   Awake, no distress   Resp:  Normal effort  MSK:   Moves extremities without difficulty  ABD:  Diffuse tenderness to abd with voluntary guarding Other:    Medical Decision Making  Medically screening exam initiated at 6:00 AM.  Appropriate orders placed.  [name] was informed that the remainder of the evaluation will be completed by another provider, this initial triage assessment does not replace that evaluation, and the importance of remaining in the ED until their evaluation is complete.  LLQ abd pain, constipation   Simone Tuckey A, PA-C 97/02/63 [id number]    Delora Fuel, MD 85/02/77 [phone]

## 2021-07-01 NOTE — ED Notes (Signed)
Pt provided with a hot pack as requested for pain.

## 2021-07-01 NOTE — ED Provider Notes (Signed)
Patient signed to me by prior provider and at good stool production with last enema.  He is requesting to go home and will be discharged   Lacretia Leigh, MD 07/01/21 1954

## 2021-07-01 NOTE — Discharge Planning (Addendum)
RNCM consulted regarding SNF (skilled nursing facility) placement as requested by pt insurance company.  RNCM contacted Janette, SW at [facility] to educate on SNF placement from ER.  Olivia Mackie will work on her end to have pt placed as holding him in ER without medical necessity is not feasible at this time.

## 2021-07-01 NOTE — ED Notes (Signed)
[facility] called, 6th in line

## 2021-07-01 NOTE — ED Notes (Signed)
Janette Pannell from [facility] called explaining that this pt has had multiple ED visits recently for similar care. Yesterday he was at their PCP office and he was de-compacted and a foley was placed. He is here for today for impaction. Pt lives alone and [facility] rep is requesting a placement in higher care facility. This RN notified Eulis Foster, MD who notified TOC team. Janette Pannel [facility] can be reached at [phone]

## 2021-07-01 NOTE — ED Notes (Signed)
Patient transported to CT 

## 2021-07-02 ENCOUNTER — Encounter: Payer: Medicare (Managed Care) | Admitting: Plastic Surgery

## 2021-07-08 ENCOUNTER — Encounter (HOSPITAL_COMMUNITY): Payer: Self-pay | Admitting: Emergency Medicine

## 2021-07-08 ENCOUNTER — Emergency Department (HOSPITAL_COMMUNITY): Payer: Medicare (Managed Care)

## 2021-07-08 ENCOUNTER — Inpatient Hospital Stay (HOSPITAL_COMMUNITY)
Admission: EM | Admit: 2021-07-08 | Discharge: 2021-07-10 | DRG: 689 | Disposition: A | Discharge status: Skilled Nursing Facility | Payer: Medicare (Managed Care) | Source: Skilled Nursing Facility | Attending: Student | Admitting: Student

## 2021-07-08 ENCOUNTER — Other Ambulatory Visit: Payer: Self-pay

## 2021-07-08 DIAGNOSIS — G9341 Metabolic encephalopathy: Secondary | ICD-10-CM | POA: Diagnosis present

## 2021-07-08 DIAGNOSIS — G309 Alzheimer's disease, unspecified: Secondary | ICD-10-CM | POA: Diagnosis present

## 2021-07-08 DIAGNOSIS — C44319 Basal cell carcinoma of skin of other parts of face: Secondary | ICD-10-CM | POA: Diagnosis not present

## 2021-07-08 DIAGNOSIS — Z20822 Contact with and (suspected) exposure to covid-19: Secondary | ICD-10-CM | POA: Diagnosis present

## 2021-07-08 DIAGNOSIS — N3000 Acute cystitis without hematuria: Secondary | ICD-10-CM

## 2021-07-08 DIAGNOSIS — N136 Pyonephrosis: Principal | ICD-10-CM | POA: Diagnosis present

## 2021-07-08 DIAGNOSIS — Z85828 Personal history of other malignant neoplasm of skin: Secondary | ICD-10-CM | POA: Diagnosis not present

## 2021-07-08 DIAGNOSIS — Z85038 Personal history of other malignant neoplasm of large intestine: Secondary | ICD-10-CM

## 2021-07-08 DIAGNOSIS — Z800 Family history of malignant neoplasm of digestive organs: Secondary | ICD-10-CM | POA: Diagnosis not present

## 2021-07-08 DIAGNOSIS — F0282 Dementia in other diseases classified elsewhere, unspecified severity, with psychotic disturbance: Secondary | ICD-10-CM | POA: Diagnosis present

## 2021-07-08 DIAGNOSIS — B952 Enterococcus as the cause of diseases classified elsewhere: Secondary | ICD-10-CM | POA: Diagnosis not present

## 2021-07-08 DIAGNOSIS — Z9049 Acquired absence of other specified parts of digestive tract: Secondary | ICD-10-CM

## 2021-07-08 DIAGNOSIS — R9431 Abnormal electrocardiogram [ECG] [EKG]: Secondary | ICD-10-CM

## 2021-07-08 DIAGNOSIS — E669 Obesity, unspecified: Secondary | ICD-10-CM | POA: Diagnosis present

## 2021-07-08 DIAGNOSIS — Z8249 Family history of ischemic heart disease and other diseases of the circulatory system: Secondary | ICD-10-CM | POA: Diagnosis not present

## 2021-07-08 DIAGNOSIS — F02B11 Dementia in other diseases classified elsewhere, moderate, with agitation: Secondary | ICD-10-CM | POA: Diagnosis not present

## 2021-07-08 DIAGNOSIS — Z6831 Body mass index (BMI) 31.0-31.9, adult: Secondary | ICD-10-CM

## 2021-07-08 DIAGNOSIS — F02818 Dementia in other diseases classified elsewhere, unspecified severity, with other behavioral disturbance: Secondary | ICD-10-CM

## 2021-07-08 DIAGNOSIS — I252 Old myocardial infarction: Secondary | ICD-10-CM | POA: Diagnosis not present

## 2021-07-08 DIAGNOSIS — H109 Unspecified conjunctivitis: Secondary | ICD-10-CM | POA: Diagnosis present

## 2021-07-08 DIAGNOSIS — I2510 Atherosclerotic heart disease of native coronary artery without angina pectoris: Secondary | ICD-10-CM | POA: Diagnosis present

## 2021-07-08 DIAGNOSIS — N390 Urinary tract infection, site not specified: Secondary | ICD-10-CM | POA: Diagnosis present

## 2021-07-08 DIAGNOSIS — E876 Hypokalemia: Secondary | ICD-10-CM

## 2021-07-08 DIAGNOSIS — I10 Essential (primary) hypertension: Secondary | ICD-10-CM | POA: Diagnosis not present

## 2021-07-08 DIAGNOSIS — R4182 Altered mental status, unspecified: Secondary | ICD-10-CM

## 2021-07-08 LAB — CBC WITH DIFFERENTIAL/PLATELET
Abs Immature Granulocytes: 0.03 10*3/uL (ref 0.00–0.07)
Basophils Absolute: 0 10*3/uL (ref 0.0–0.1)
Basophils Relative: 0 %
Eosinophils Absolute: 0.2 10*3/uL (ref 0.0–0.5)
Eosinophils Relative: 2 %
HCT: 43.1 % (ref 39.0–52.0)
Hemoglobin: 15 g/dL (ref 13.0–17.0)
Immature Granulocytes: 0 %
Lymphocytes Relative: 11 %
Lymphs Abs: 0.8 10*3/uL (ref 0.7–4.0)
MCH: 30.7 pg (ref 26.0–34.0)
MCHC: 34.8 g/dL (ref 30.0–36.0)
MCV: 88.3 fL (ref 80.0–100.0)
Monocytes Absolute: 0.6 10*3/uL (ref 0.1–1.0)
Monocytes Relative: 8 %
Neutro Abs: 5.6 10*3/uL (ref 1.7–7.7)
Neutrophils Relative %: 79 %
Platelets: 205 10*3/uL (ref 150–400)
RBC: 4.88 MIL/uL (ref 4.22–5.81)
RDW: 13.2 % (ref 11.5–15.5)
WBC: 7.2 10*3/uL (ref 4.0–10.5)
nRBC: 0 % (ref 0.0–0.2)

## 2021-07-08 LAB — URINALYSIS, ROUTINE W REFLEX MICROSCOPIC
Glucose, UA: NEGATIVE mg/dL
Ketones, ur: 15 mg/dL — AB
Nitrite: NEGATIVE
Protein, ur: 30 mg/dL — AB
Specific Gravity, Urine: 1.025 (ref 1.005–1.030)
pH: 5.5 (ref 5.0–8.0)

## 2021-07-08 LAB — URINALYSIS, MICROSCOPIC (REFLEX): WBC, UA: >50 WBC/hpf (ref 0–5)

## 2021-07-08 LAB — COMPREHENSIVE METABOLIC PANEL
ALT: 24 U/L (ref 0–44)
AST: 22 U/L (ref 15–41)
Albumin: 3.4 g/dL — ABNORMAL LOW (ref 3.5–5.0)
Alkaline Phosphatase: 75 U/L (ref 38–126)
Anion gap: 13 (ref 5–15)
BUN: 17 mg/dL (ref 8–23)
CO2: 28 mmol/L (ref 22–32)
Calcium: 9 mg/dL (ref 8.9–10.3)
Chloride: 95 mmol/L — ABNORMAL LOW (ref 98–111)
Creatinine, Ser: 0.96 mg/dL (ref 0.61–1.24)
GFR, Estimated: >60 mL/min (ref >60)
Glucose, Bld: 141 mg/dL — ABNORMAL HIGH (ref 70–99)
Potassium: 2.6 mmol/L — CL (ref 3.5–5.1)
Sodium: 136 mmol/L (ref 135–145)
Total Bilirubin: 2.2 mg/dL — ABNORMAL HIGH (ref 0.3–1.2)
Total Protein: 6.4 g/dL — ABNORMAL LOW (ref 6.5–8.1)

## 2021-07-08 LAB — RESP PANEL BY RT-PCR (FLU A&B, COVID) ARPGX2
Influenza A by PCR: NEGATIVE
Influenza B by PCR: NEGATIVE
SARS Coronavirus 2 by RT PCR: NEGATIVE

## 2021-07-08 LAB — APTT: aPTT: 32 seconds (ref 24–36)

## 2021-07-08 LAB — MAGNESIUM: Magnesium: 1.7 mg/dL (ref 1.7–2.4)

## 2021-07-08 LAB — LACTIC ACID, PLASMA
Lactic Acid, Venous: 1.6 mmol/L (ref 0.5–1.9)
Lactic Acid, Venous: 1.3 mmol/L (ref 0.5–1.9)

## 2021-07-08 LAB — PROTIME-INR
INR: 1.1 (ref 0.8–1.2)
Prothrombin Time: 14.3 seconds (ref 11.4–15.2)

## 2021-07-08 LAB — POTASSIUM: Potassium: 4 mmol/L (ref 3.5–5.1)

## 2021-07-08 MED ORDER — SODIUM CHLORIDE 0.9 % IV SOLN
2.0000 g | Freq: Once | INTRAVENOUS | Status: AC
  Administered 2021-07-08: 03:00:00 2 g via INTRAVENOUS
  Filled 2021-07-08: qty 20

## 2021-07-08 MED ORDER — LISINOPRIL 20 MG PO TABS
20.0000 mg | ORAL_TABLET | Freq: Every day | ORAL | Status: DC
  Administered 2021-07-08 – 2021-07-10 (×3): 20 mg via ORAL
  Filled 2021-07-08: qty 1
  Filled 2021-07-08: qty 2
  Filled 2021-07-08: qty 1

## 2021-07-08 MED ORDER — BOOST PO LIQD
237.0000 mL | Freq: Three times a day (TID) | ORAL | Status: DC
  Administered 2021-07-09 – 2021-07-10 (×5): 237 mL via ORAL
  Filled 2021-07-08 (×7): qty 237

## 2021-07-08 MED ORDER — MAGNESIUM SULFATE 2 GM/50ML IV SOLN
2.0000 g | Freq: Once | INTRAVENOUS | Status: AC
  Administered 2021-07-08: 09:00:00 2 g via INTRAVENOUS
  Filled 2021-07-08: qty 50

## 2021-07-08 MED ORDER — SENNOSIDES-DOCUSATE SODIUM 8.6-50 MG PO TABS: 1.0000 | ORAL_TABLET | Freq: Every evening | ORAL | Status: DC | PRN

## 2021-07-08 MED ORDER — POTASSIUM CHLORIDE 10 MEQ/100ML IV SOLN
10.0000 meq | INTRAVENOUS | Status: AC
  Administered 2021-07-08 (×4): 10 meq via INTRAVENOUS
  Filled 2021-07-08 (×4): qty 100

## 2021-07-08 MED ORDER — ACETAMINOPHEN 650 MG RE SUPP: 650.0000 mg | Freq: Four times a day (QID) | RECTAL | Status: DC | PRN

## 2021-07-08 MED ORDER — LACTATED RINGERS IV SOLN: INTRAVENOUS | Status: DC

## 2021-07-08 MED ORDER — TAMSULOSIN HCL 0.4 MG PO CAPS
0.8000 mg | ORAL_CAPSULE | Freq: Every day | ORAL | Status: DC
  Administered 2021-07-08 – 2021-07-09 (×2): 0.8 mg via ORAL
  Filled 2021-07-08 (×2): qty 2

## 2021-07-08 MED ORDER — METOPROLOL TARTRATE 12.5 MG HALF TABLET
12.5000 mg | ORAL_TABLET | Freq: Two times a day (BID) | ORAL | Status: DC
  Administered 2021-07-08 – 2021-07-10 (×4): 12.5 mg via ORAL
  Filled 2021-07-08 (×5): qty 1

## 2021-07-08 MED ORDER — SODIUM CHLORIDE 0.9 % IV SOLN
1.0000 g | INTRAVENOUS | Status: DC
  Administered 2021-07-09: 03:00:00 1 g via INTRAVENOUS
  Filled 2021-07-08: qty 10

## 2021-07-08 MED ORDER — POTASSIUM CHLORIDE 10 MEQ/100ML IV SOLN
10.0000 meq | INTRAVENOUS | Status: AC
  Administered 2021-07-08: 09:00:00 10 meq via INTRAVENOUS
  Filled 2021-07-08: qty 100

## 2021-07-08 MED ORDER — CLOPIDOGREL BISULFATE 75 MG PO TABS
75.0000 mg | ORAL_TABLET | Freq: Every day | ORAL | Status: DC
  Administered 2021-07-08 – 2021-07-10 (×3): 75 mg via ORAL
  Filled 2021-07-08 (×3): qty 1

## 2021-07-08 MED ORDER — AMLODIPINE BESYLATE 5 MG PO TABS
5.0000 mg | ORAL_TABLET | Freq: Every day | ORAL | Status: DC
  Administered 2021-07-08 – 2021-07-10 (×3): 5 mg via ORAL
  Filled 2021-07-08 (×3): qty 1

## 2021-07-08 MED ORDER — ERYTHROMYCIN 5 MG/GM OP OINT
1.0000 "application " | TOPICAL_OINTMENT | Freq: Four times a day (QID) | OPHTHALMIC | Status: DC
  Administered 2021-07-08 – 2021-07-10 (×7): 1 via OPHTHALMIC
  Filled 2021-07-08: qty 3.5

## 2021-07-08 MED ORDER — NAPHAZOLINE-GLYCERIN 0.012-0.25 % OP SOLN
1.0000 [drp] | Freq: Four times a day (QID) | OPHTHALMIC | Status: DC | PRN
  Filled 2021-07-08: qty 15

## 2021-07-08 MED ORDER — DUTASTERIDE 0.5 MG PO CAPS
0.5000 mg | ORAL_CAPSULE | Freq: Every day | ORAL | Status: DC
  Administered 2021-07-08 – 2021-07-10 (×3): 0.5 mg via ORAL
  Filled 2021-07-08 (×3): qty 1

## 2021-07-08 MED ORDER — LACTASE 3000 UNITS PO TABS
3000.0000 [IU] | ORAL_TABLET | Freq: Three times a day (TID) | ORAL | Status: DC
  Administered 2021-07-09 – 2021-07-10 (×4): 3000 [IU] via ORAL
  Filled 2021-07-08 (×10): qty 1

## 2021-07-08 MED ORDER — ENOXAPARIN SODIUM 40 MG/0.4ML IJ SOSY
40.0000 mg | PREFILLED_SYRINGE | INTRAMUSCULAR | Status: DC
  Administered 2021-07-08 – 2021-07-10 (×3): 40 mg via SUBCUTANEOUS
  Filled 2021-07-08 (×3): qty 0.4

## 2021-07-08 MED ORDER — PRAVASTATIN SODIUM 10 MG PO TABS
20.0000 mg | ORAL_TABLET | Freq: Every day | ORAL | Status: DC
  Administered 2021-07-08 – 2021-07-10 (×3): 20 mg via ORAL
  Filled 2021-07-08 (×3): qty 2

## 2021-07-08 MED ORDER — POTASSIUM CHLORIDE 10 MEQ/100ML IV SOLN
10.0000 meq | INTRAVENOUS | Status: AC
  Administered 2021-07-08 (×3): 10 meq via INTRAVENOUS
  Filled 2021-07-08 (×3): qty 100

## 2021-07-08 MED ORDER — HALOPERIDOL LACTATE 5 MG/ML IJ SOLN
2.0000 mg | Freq: Once | INTRAMUSCULAR | Status: AC
  Administered 2021-07-08: 09:00:00 2 mg via INTRAVENOUS
  Filled 2021-07-08: qty 1

## 2021-07-08 MED ORDER — ACETAMINOPHEN 325 MG PO TABS
650.0000 mg | ORAL_TABLET | Freq: Four times a day (QID) | ORAL | Status: DC | PRN
  Administered 2021-07-08 – 2021-07-09 (×2): 650 mg via ORAL
  Filled 2021-07-08 (×2): qty 2

## 2021-07-08 MED ORDER — VITAMIN D 25 MCG (1000 UNIT) PO TABS
2000.0000 [IU] | ORAL_TABLET | Freq: Every morning | ORAL | Status: DC
  Administered 2021-07-09 – 2021-07-10 (×2): 2000 [IU] via ORAL
  Filled 2021-07-08 (×2): qty 2

## 2021-07-08 MED ORDER — LISINOPRIL-HYDROCHLOROTHIAZIDE 20-25 MG PO TABS: 1.0000 | ORAL_TABLET | Freq: Every day | ORAL | Status: DC

## 2021-07-08 MED ORDER — HYDROCHLOROTHIAZIDE 25 MG PO TABS
25.0000 mg | ORAL_TABLET | Freq: Every day | ORAL | Status: DC
  Administered 2021-07-08 – 2021-07-09 (×2): 25 mg via ORAL
  Filled 2021-07-08 (×2): qty 1

## 2021-07-08 MED ORDER — POTASSIUM CHLORIDE CRYS ER 20 MEQ PO TBCR
40.0000 meq | EXTENDED_RELEASE_TABLET | Freq: Once | ORAL | Status: AC
  Administered 2021-07-08: 12:00:00 40 meq via ORAL
  Filled 2021-07-08: qty 2

## 2021-07-08 MED ORDER — POTASSIUM CHLORIDE CRYS ER 20 MEQ PO TBCR
40.0000 meq | EXTENDED_RELEASE_TABLET | Freq: Once | ORAL | Status: AC
  Administered 2021-07-08: 03:00:00 40 meq via ORAL
  Filled 2021-07-08: qty 2

## 2021-07-08 NOTE — Progress Notes (Signed)
PROGRESS NOTE    Jeffrey Zimmerman  HQI:[id number] DOB: [birth year] DOA: 07/08/2021 PCP: [name], MD   Chief Complaint  Patient presents with   Fever / Conjunctivitis    Brief Narrative:  This is a no charge note as patient was seen and admitted earlier today by Dr. Oneida Alar, patient was seen and examined, chart, imaging and labs were reviewed.   Jeffrey Zimmerman is a 85 y.o. male with medical history significant for hypertension, basal cell carcinoma, CAD, Alzheimer's dementia who presents by EMS from SNF with complaint of fever.  Patient is also had increased confusion and abnormal behavior and being more aggressive towards staff.  He normally is a very pleasant according to report that staff give the ER physician but yesterday he became more aggressive and tried to hit staff members and had increased confusion.  Per report it is unlike him to be agitated and aggressive towards staff.  He recently had of a skin cancer on the right side of his face and dressing is still in place.  Work-up in ED significant for hypokalemia, UTI, admitted for further work-up.      Assessment & Plan:   Principal Problem:   Complicated UTI (urinary tract infection) Active Problems:   Hypertension   Hypokalemia   Prolonged QT interval   Alzheimer's dementia ([place])  Complicated UTI  -positive  of urine analysis, started Rocephin, follow urine cultures, continue with IV hydration   Severe Hypokalemia -Potassium level significantly low at 2.6, will monitor on telemetry, repleted, will recheck this afternoon, magnesium at 1.7, will replete given severe hypokalemia and prolonged QTc.  Acute metabolic encephalopathy -He is with known baseline dementia, but this is way off from his baseline per facility as he is usually demented but more calm and appropriate, he is currently significantly agitated, with hallucinations. . -  likely due to UTI   Hypertension Continue norvasc and lisinopril hct. Monitor  BP.     Prolonged QT interval Avoid medications which could further prolong QT interval hold lexapro   Alzheimer's dementia Chronic   Hx of basal cell carcinoma with recent resection of lesion on right face. -continue with eyedrops. - We will consult wound care regarding dressing changes   DVT prophylaxis: (Lovenox/Heparin/SCD's/anticoagulated/None (if comfort care) Code Status: Full Family communication: Unable to Leave wife a voicemail    Subjective:  Patient is significantly confused, hallucinating, asking" what are all these people doing at the corner of the room" while  no one is present.  Objective: Vitals:   07/08/21 0615 07/08/21 0800 07/08/21 0945 07/08/21 1142  BP: (!) 169/90 (!) 144/74 132/90 (!) 156/74  Pulse: 81 76    Resp: (!) 26 17    Temp:      TempSrc:      SpO2: 95% 94%    Weight:      Height:        Intake/Output Summary (Last 24 hours) at 07/08/2021 1316 Last data filed at 07/08/2021 1324 Gross per 24 hour  Intake 345.09 ml  Output --  Net 345.09 ml   Filed Weights   07/08/21 0201  Weight: 95 kg    Examination:  Pale, thin appearing chronically ill male, laying in the bed, anxious Right eye erythema, with clear drainage, with surgical wound at right cheek area status post resection, bandaged.   Symmetrical Chest wall movement, Good air movement bilaterally, CTAB RRR,No Gallops,Rubs or new Murmurs, No Parasternal Heave +ve B.Sounds, Abd Soft, No tenderness, No rebound -  guarding or rigidity. No Cyanosis, Clubbing or edema, No new Rash or bruise      Data Reviewed: I have personally reviewed following labs and imaging studies  CBC: Recent Labs  Lab 07/08/21 0200  WBC 7.2  NEUTROABS 5.6  HGB 15.0  HCT 43.1  MCV 88.3  PLT 161    Basic Metabolic Panel: Recent Labs  Lab 07/08/21 0200 07/08/21 0401  NA 136  --   K 2.6*  --   CL 95*  --   CO2 28  --   GLUCOSE 141*  --   BUN 17  --   CREATININE 0.96  --   CALCIUM 9.0  --    MG  --  1.7    GFR: Estimated Creatinine Clearance: 65.1 mL/min (by C-G formula based on SCr of 0.96 mg/dL).  Liver Function Tests: Recent Labs  Lab 07/08/21 0200  AST 22  ALT 24  ALKPHOS 75  BILITOT 2.2*  PROT 6.4*  ALBUMIN 3.4*    CBG: No results for input(s): GLUCAP in the last 168 hours.   Recent Results (from the past 240 hour(s))  Resp Panel by RT-PCR (Flu A&B, Covid) Nasopharyngeal Swab     Status: None   Collection Time: 07/08/21  2:15 AM   Specimen: Nasopharyngeal Swab; Nasopharyngeal(NP) swabs in vial transport medium  Result Value Ref Range Status   SARS Coronavirus 2 by RT PCR NEGATIVE NEGATIVE Final    Comment: (NOTE) SARS-CoV-2 target nucleic acids are NOT DETECTED.  The SARS-CoV-2 RNA is generally detectable in upper respiratory specimens during the acute phase of infection. The lowest concentration of SARS-CoV-2 viral copies this assay can detect is 138 copies/mL. A negative result does not preclude SARS-Cov-2 infection and should not be used as the sole basis for treatment or other patient management decisions. A negative result may occur with  improper specimen collection/handling, submission of specimen other than nasopharyngeal swab, presence of viral mutation(s) within the areas targeted by this assay, and inadequate number of viral copies(<138 copies/mL). A negative result must be combined with clinical observations, patient history, and epidemiological information. The expected result is Negative.  Fact Sheet for Patients:  EntrepreneurPulse.com.au  Fact Sheet for Healthcare Providers:  IncredibleEmployment.be  This test is no t yet approved or cleared by the [place] FDA and  has been authorized for detection and/or diagnosis of SARS-CoV-2 by FDA under an Emergency Use Authorization (EUA). This EUA will remain  in effect (meaning this test can be used) for the duration of the COVID-19 declaration  under Section 564(b)(1) of the Act, 21 U.S.C.section 360bbb-3(b)(1), unless the authorization is terminated  or revoked sooner.       Influenza A by PCR NEGATIVE NEGATIVE Final   Influenza B by PCR NEGATIVE NEGATIVE Final    Comment: (NOTE) The Xpert Xpress SARS-CoV-2/FLU/RSV plus assay is intended as an aid in the diagnosis of influenza from Nasopharyngeal swab specimens and should not be used as a sole basis for treatment. Nasal washings and aspirates are unacceptable for Xpert Xpress SARS-CoV-2/FLU/RSV testing.  Fact Sheet for Patients: EntrepreneurPulse.com.au  Fact Sheet for Healthcare Providers: IncredibleEmployment.be  This test is not yet approved or cleared by the [place] FDA and has been authorized for detection and/or diagnosis of SARS-CoV-2 by FDA under an Emergency Use Authorization (EUA). This EUA will remain in effect (meaning this test can be used) for the duration of the COVID-19 declaration under Section 564(b)(1) of the Act, 21 U.S.C. section 360bbb-3(b)(1), unless the authorization  is terminated or revoked.  Performed at [facility], [place] [address] [place], [place] [address]          Radiology Studies: DG Chest Port 1 View  Result Date: 07/08/2021 CLINICAL DATA:  Sepsis, fever EXAM: PORTABLE CHEST 1 VIEW COMPARISON:  08/07/2020 FINDINGS: The heart size and mediastinal contours are within normal limits. Both lungs are clear. The visualized skeletal structures are unremarkable. IMPRESSION: No active disease. Electronically Signed   By: Fidela Salisbury M.D.   On: 07/08/2021 03:04        Scheduled Meds:  amLODipine  5 mg Oral Daily   clopidogrel  75 mg Oral Daily   enoxaparin (LOVENOX) injection  40 mg Subcutaneous Q24H   lisinopril  20 mg Oral Daily   And   hydrochlorothiazide  25 mg Oral Daily   pravastatin  20 mg Oral Daily   tamsulosin  0.8 mg Oral QHS   Continuous Infusions:   [START ON 07/09/2021] cefTRIAXone (ROCEPHIN)  IV     lactated ringers 75 mL/hr at 07/08/21 1202   potassium chloride 10 mEq (07/08/21 1159)     LOS: 0 days      Phillips Climes, MD [facility]   To contact the attending provider between 7A-7P or the covering provider during after hours 7P-7A, please log into the web site [email or web address] and access using universal [place] password for that web site. If you do not have the password, please call the hospital operator.  07/08/2021, 1:16 PM

## 2021-07-08 NOTE — ED Triage Notes (Addendum)
Patient arrived with EMS from [facility] , staff reported fever this evening , he received Tylenol 1 gram po by EMS for T= 101.4 , respirations unlabored , EMS also reported staff stated patient is confused this evening . Alert to to self /time/place and situation at arrival . Patient also presents with right eye redness and lacrimation .

## 2021-07-08 NOTE — H&P (Signed)
History and Physical    [name]:[id number] DOB: Oct 18, 1935 DOA: 07/08/2021  PCP: [name], MD   Patient coming from: SNF  Chief Complaint: Fever, confusion, abnormal behavior  HPI: Jeffrey Zimmerman is a 85 y.o. male with medical history significant for hypertension, basal cell carcinoma, CAD, Alzheimer's dementia who presents by EMS from SNF with complaint of fever.  Patient is also had increased confusion and abnormal behavior and being more aggressive towards staff.  He normally is a very pleasant according to report that staff give the ER physician but yesterday he became more aggressive and tried to hit staff members and had increased confusion.  Per report it is unlike him to be agitated and aggressive towards staff.  He recently had of a skin cancer on the right side of his face and dressing is still in place.  No staff or family members are present at this time.  ED Course: Mr. Regas has been medically stable in the emergency room.  Have hypokalemia with a potassium of 2.6.  Magnesium is now ordered.  ER physician was already started potassium repletion.  He also has a urinary tract infection on urinalysis.  Foley catheter was placed in ER.  CBC is unremarkable.  Lactic acid 1.6.  Sodium 136 potassium 2.6 chloride 95 bicarb 28 creatinine 0.96 BUN 17 glucose 141 alk phos a 75 AST 22 ALT 24 bilirubin 2.2.  COVID test pending. Urine and blood cultures obtained in the emergency room.  Hospitalist service asked to admit for further management  Review of Systems:  Review of system cannot be obtained secondary to Alzheimer's dementia  Past Medical History:  Diagnosis Date   Adenomatous colon polyp 08/2008   Arthritis    Basal cell carcinoma    Cancer ([place])    colon   Coronary artery disease    04/21/10 Anter MI with BMS to LAD with residual proximal LAD EF 40%   Gout    History of cholecystectomy    History of colonoscopy    1/5 AND 2/10   Hypertension    Irritable  bowel syndrome    Myocardial infarct ([facility])    SVT (supraventricular tachycardia) ([place])     Past Surgical History:  Procedure Laterality Date   ADJACENT TISSUE TRANSFER/TISSUE REARRANGEMENT Right 06/23/2021   Procedure: ADJACENT TISSUE TRANSFER/TISSUE REARRANGEMENT;  Surgeon: Cindra Presume, MD;  Location: [facility];  Service: Plastics;  Laterality: Right;   APPENDECTOMY     CARDIAC CATHETERIZATION     04/21/10 Anter MI with BMS to LAD with residual proximal LAD EF 40%   CHOLECYSTECTOMY     pt does not remember when   [place] Right 06/23/2021   Procedure: Right cheek reconstruction;  Surgeon: Cindra Presume, MD;  Location: [facility];  Service: Plastics;  Laterality: Right;   KNEE ARTHOSCOPY Bilateral    PAROTIDECTOMY N/A 06/23/2021   Procedure: PAROTIDECTOMY  RIGHT SUPERFICIAL WITH FACIAL NERVE DISSECTION;  Surgeon: Izora Gala, MD;  Location: [facility];  Service: ENT;  Laterality: N/A;   PARTIAL LEFT NEPHRECTOMY     (for oncocytoma-benign)    Social History  reports that he has never smoked. He has never used smokeless tobacco. He reports that he does not drink alcohol and does not use drugs.  Allergies  Allergen Reactions   Codeine Other (See Comments)    "Doesn't help with with pain control"    Family History  Problem Relation Age of Onset   Heart attack  Mother    CAD Mother    Colon cancer Father    Rheum arthritis Maternal Grandfather      Prior to Admission medications   Medication Sig Start Date End Date Taking? Authorizing Provider  amLODipine (NORVASC) 5 MG tablet TAKE 1 TABLET BY MOUTH DAILY 03/28/19   [name], MD  Cholecalciferol (VITAMIN D3) 50 MCG (2000 UT) TABS Take 2,000 Units by mouth in the morning.    [provider]  clopidogrel (PLAVIX) 75 MG tablet Take 1 tablet (75 mg total) by mouth daily. 07/05/20   [name], MD  fluticasone (FLONASE SENSIMIST) 27.5 MCG/SPRAY nasal spray Place 1 spray into the nose daily.     [provider]  lactase (LACTAID) 3000 units tablet Take 3,000-6,000 Units by mouth 3 (three) times daily with meals.    [provider]  lisinopril-hydrochlorothiazide (ZESTORETIC) 20-25 MG tablet Take 1 tablet by mouth daily.    [provider]  Loperamide-Simethicone 2-125 MG TABS Take 1 tablet by mouth 3 (three) times daily before meals. TAKE 45 MINUTES BEFORE MEALS    [provider]  metoprolol tartrate (LOPRESSOR) 25 MG tablet TAKE 1/2 TABLET BY MOUTH TWICE A DAY 10/09/20   Belva Crome, MD  nitroGLYCERIN (NITROSTAT) 0.4 MG SL tablet Place 1 tablet (0.4 mg total) under the tongue every 5 (five) minutes as needed for chest pain. 05/13/16   [name], MD  nystatin ointment (MYCOSTATIN) Apply 1 application topically 2 (two) times daily. 06/29/21   Fatima Blank, MD  ondansetron (ZOFRAN) 4 MG tablet Take 1 tablet (4 mg total) by mouth every 8 (eight) hours as needed for nausea or vomiting. 06/23/21   Scheeler, Carola Rhine, PA-C  polyethylene glycol (MIRALAX) 17 g packet Take 17 g by mouth daily. 06/21/21 07/21/21  Idamae Lusher, MD  potassium chloride (KLOR-CON) 10 MEQ tablet Take 1 tablet (10 mEq total) by mouth 2 (two) times daily. 06/19/21   Lacretia Leigh, MD  pravastatin (PRAVACHOL) 20 MG tablet TAKE 1 TABLET (20 MG TOTAL) BY MOUTH DAILY. PLEASE SCHEDULE APPOINTMENT FOR FUTURE REFILLS. Patient taking differently: Take 20 mg by mouth daily. 08/07/20   [name], MD  tamsulosin (FLOMAX) 0.4 MG CAPS capsule Take 0.8 mg by mouth at bedtime. 11/19/19   [provider]    Physical Exam: Vitals:   07/08/21 0315 07/08/21 0330 07/08/21 0400 07/08/21 0415  BP: (!) 156/80 (!) 152/80 (!) 155/81 127/82  Pulse: 85 71 79 72  Resp: (!) 31 20 (!) 27 19  Temp:      TempSrc:      SpO2: 96% 96% 98% 95%  Weight:      Height:        Constitutional: NAD, calm, comfortable Vitals:   07/08/21 0315 07/08/21 0330 07/08/21 0400 07/08/21  0415  BP: (!) 156/80 (!) 152/80 (!) 155/81 127/82  Pulse: 85 71 79 72  Resp: (!) 31 20 (!) 27 19  Temp:      TempSrc:      SpO2: 96% 96% 98% 95%  Weight:      Height:       General: WDWN, Alert and oriented to self  Eyes: PERRL, conjunctivae injected on right with clear drainage. Left conjunctiva normal.  Sclera nonicteric HENT:  [place]/AT, external ears normal.  Nares patent without epistasis.  Mucous membranes are moist. Posterior pharynx clear of any exudate. Dressing on right zygomatic region at site of resection of BCC. Neck: Soft, normal  range of motion, supple, no masses, Trachea midline Respiratory: clear to auscultation bilaterally, no wheezing, no crackles. Normal respiratory effort. No accessory muscle use.  Cardiovascular: Regular rate and rhythm, no murmurs / rubs / gallops. No extremity edema. 2+ pedal pulses. Abdomen: Soft, no tenderness, nondistended, no rebound or guarding. Bowel sounds normoactive Musculoskeletal: FROM. no cyanosis. No joint deformity upper and lower extremities. Normal muscle tone.  Skin: Warm, dry, intact no rashes, lesions, ulcers. No induration Neurologic: Normal speech.  Sensation intact to touch. patella DTR +1 bilaterally. Grip strength 4/5 bilaterally Psychiatric: Normal mood.    Labs on Admission: I have personally reviewed following labs and imaging studies  CBC: Recent Labs  Lab 07/01/21 0611 07/08/21 0200  WBC 8.1 7.2  NEUTROABS 6.4 5.6  HGB 15.6 15.0  HCT 45.6 43.1  MCV 87.9 88.3  PLT 220 578    Basic Metabolic Panel: Recent Labs  Lab 07/01/21 0611 07/01/21 0856 07/08/21 0200  NA 136  --  136  K 3.1*  --  2.6*  CL 100  --  95*  CO2 23  --  28  GLUCOSE 116*  --  141*  BUN 22  --  17  CREATININE 1.17  --  0.96  CALCIUM 8.8*  --  9.0  MG  --  2.1  --     GFR: Estimated Creatinine Clearance: 65.1 mL/min (by C-G formula based on SCr of 0.96 mg/dL).  Liver Function Tests: Recent Labs  Lab 07/01/21 0611 07/08/21 0200   AST 37 22  ALT 41 24  ALKPHOS 89 75  BILITOT 2.1* 2.2*  PROT 6.5 6.4*  ALBUMIN 3.5 3.4*    Urine analysis:    Component Value Date/Time   COLORURINE YELLOW 07/08/2021 0159   APPEARANCEUR CLOUDY (A) 07/08/2021 0159   LABSPEC 1.025 07/08/2021 0159   PHURINE 5.5 07/08/2021 0159   GLUCOSEU NEGATIVE 07/08/2021 0159   HGBUR MODERATE (A) 07/08/2021 0159   BILIRUBINUR MODERATE (A) 07/08/2021 0159   KETONESUR 15 (A) 07/08/2021 0159   PROTEINUR 30 (A) 07/08/2021 0159   NITRITE NEGATIVE 07/08/2021 0159   LEUKOCYTESUR MODERATE (A) 07/08/2021 0159    Radiological Exams on Admission: DG Chest Port 1 View  Result Date: 07/08/2021 CLINICAL DATA:  Sepsis, fever EXAM: PORTABLE CHEST 1 VIEW COMPARISON:  08/07/2020 FINDINGS: The heart size and mediastinal contours are within normal limits. Both lungs are clear. The visualized skeletal structures are unremarkable. IMPRESSION: No active disease. Electronically Signed   By: Fidela Salisbury M.D.   On: 07/08/2021 03:04    EKG: Independently reviewed.  EKG shows normal sinus rhythm with left anterior fascicular block.  No acute ST elevation or depression.  QTc 498  Assessment/Plan Principal Problem:   Complicated UTI (urinary tract infection) Mr. Castilla is admitted to Medical Telemetry floor.  Started on Rocephin for antibiotic coverage.  IVF hydration with LR at 75 ml/hr  Fall precautions ordered.  Check CBC in am  Active Problems:   Hypokalemia Check Magnesium level and if low will replace.  Replace potassium with 40 mEq potassium IV.  Recheck potassium level this afternoon and recheck BMP in am    Hypertension Continue norvasc and lisinopril hct. Monitor BP.     Prolonged QT interval Avoid medications which could further prolong QT interval    Alzheimer's dementia Chronic     Hx of basal cell carcinoma with recent resection of lesion on right face.   DVT prophylaxis: Lovenox for DVT prophylaxis.   Code Status:  Full Code   Family Communication:  No family at bedside. Further recommendations to follow as clinically indicated. Disposition Plan:   Patient is from:  SNF  Anticipated DC to:  SNF  Anticipated DC date:  Anticipate 2 midnight stay  Time spent on admission:       55 minutes  Admission status:  Inpatient   Yevonne Aline Enyla Lisbon MD [facility]  How to contact the [facility] Attending or Consulting provider [place] or covering provider during after hours [place], for this patient?   Check the care team in [facility] and look for a) attending/consulting [facility] provider listed and b) the [facility] team listed Log into [email or web address] and use [place]'s universal password to access. If you do not have the password, please contact the hospital operator. Locate the [facility] provider you are looking for under [facility] and page to a number that you can be directly reached. If you still have difficulty reaching the provider, please page the [facility] (Director on Call) for the Hospitalists listed on amion for assistance.  07/08/2021, 4:23 AM

## 2021-07-08 NOTE — ED Notes (Signed)
Patient called for nurse, stating he was concerned about the multiple people currently staring at him in his room and the dog hiding under his sink. Patient is currently in his room alone. Patient is agitated, demanding to be placed "on the next ambulance available to go to [facility]".

## 2021-07-08 NOTE — ED Provider Notes (Signed)
[facility] Emergency Department Provider Note MRN:  [id number]  Arrival date & time: 07/08/21     Chief Complaint   Fever / Conjunctivitis   History of Present Illness   Jeffrey Zimmerman is a 85 y.o. year-old male with a history of CAD presenting to the ED with chief complaint of fever.  Fever reported at care facility.  Also with abnormal behavior, seemed confused, a bit aggressive towards staff.  Patient denies any significant complaints at this time, very hard of hearing, seems a bit confused or has dementia.  I was unable to obtain an accurate HPI, PMH, or ROS due to the patient's altered mental status.  Level 5 caveat.  Review of Systems  Positive for fever, altered mental status.  Patient's Health History    Past Medical History:  Diagnosis Date   Adenomatous colon polyp 08/2008   Arthritis    Basal cell carcinoma    Cancer ([place])    colon   Coronary artery disease    04/21/10 Anter MI with BMS to LAD with residual proximal LAD EF 40%   Gout    History of cholecystectomy    History of colonoscopy    1/5 AND 2/10   Hypertension    Irritable bowel syndrome    Myocardial infarct [facility])    SVT (supraventricular tachycardia) ([place])     Past Surgical History:  Procedure Laterality Date   ADJACENT TISSUE TRANSFER/TISSUE REARRANGEMENT Right 06/23/2021   Procedure: ADJACENT TISSUE TRANSFER/TISSUE REARRANGEMENT;  Surgeon: Cindra Presume, MD;  Location: [place];  Service: Plastics;  Laterality: Right;   APPENDECTOMY     CARDIAC CATHETERIZATION     04/21/10 Anter MI with BMS to LAD with residual proximal LAD EF 40%   CHOLECYSTECTOMY     pt does not remember when   [place] Right 06/23/2021   Procedure: Right cheek reconstruction;  Surgeon: Cindra Presume, MD;  Location: [place];  Service: Plastics;  Laterality: Right;   KNEE ARTHOSCOPY Bilateral    PAROTIDECTOMY N/A 06/23/2021   Procedure: PAROTIDECTOMY  RIGHT SUPERFICIAL WITH FACIAL  NERVE DISSECTION;  Surgeon: Izora Gala, MD;  Location: [place];  Service: ENT;  Laterality: N/A;   PARTIAL LEFT NEPHRECTOMY     (for oncocytoma-benign)    Family History  Problem Relation Age of Onset   Heart attack Mother    CAD Mother    Colon cancer Father    Rheum arthritis Maternal Grandfather     Social History   Socioeconomic History   Marital status: Married    Spouse name: Not on file   Number of children: Not on file   Years of education: Not on file   Highest education level: Not on file  Occupational History   Not on file  Tobacco Use   Smoking status: Never   Smokeless tobacco: Never  Vaping Use   Vaping Use: Never used  Substance and Sexual Activity   Alcohol use: No   Drug use: No   Sexual activity: Not on file  Other Topics Concern   Not on file  Social History Narrative   Not on file   Social Determinants of Health   Financial Resource Strain: Not on file  Food Insecurity: Not on file  Transportation Needs: Not on file  Physical Activity: Not on file  Stress: Not on file  Social Connections: Not on file  Intimate Partner Violence: Not on file     Physical Exam  Vitals:   07/08/21 0300 07/08/21 0315  BP: (!) 145/84 (!) 156/80  Pulse: 83 85  Resp: (!) 27 (!) 31  Temp:    SpO2: 95% 96%    CONSTITUTIONAL: Chronically ill-appearing, NAD NEURO:  Alert and oriented x 3, no focal deficits EYES:  eyes equal and reactive, erythema to the right conjunctivo with tearing ENT/NECK:  no LAD, no JVD, dressing to the right zygoma region CARDIO: Regular rate, well-perfused, normal S1 and S2 PULM:  CTAB no wheezing or rhonchi GI/GU:  normal bowel sounds, non-distended, non-tender MSK/SPINE:  No gross deformities, no edema SKIN:  no rash, atraumatic PSYCH:  Appropriate speech and behavior  *Additional and/or pertinent findings included in MDM below  Diagnostic and Interventional Summary    EKG Interpretation  Date/Time:  Tuesday July 08 2021  01:58:44 EST Ventricular Rate:  82 PR Interval:  190 QRS Duration: 106 QT Interval:  426 QTC Calculation: 498 R Axis:   -41 Text Interpretation: Sinus rhythm Consider right atrial enlargement Left anterior fascicular block Consider anterior infarct ST elevation, consider inferior injury Baseline wander in lead(s) II Confirmed by Gerlene Fee [phone]) on 07/08/2021 2:00:47 AM       Labs Reviewed  COMPREHENSIVE METABOLIC PANEL - Abnormal; Notable for the following components:      Result Value   Potassium 2.6 (*)    Chloride 95 (*)    Glucose, Bld 141 (*)    Total Protein 6.4 (*)    Albumin 3.4 (*)    Total Bilirubin 2.2 (*)    All other components within normal limits  URINALYSIS, ROUTINE W REFLEX MICROSCOPIC - Abnormal; Notable for the following components:   APPearance CLOUDY (*)    Hgb urine dipstick MODERATE (*)    Bilirubin Urine MODERATE (*)    Ketones, ur 15 (*)    Protein, ur 30 (*)    Leukocytes,Ua MODERATE (*)    All other components within normal limits  URINALYSIS, MICROSCOPIC (REFLEX) - Abnormal; Notable for the following components:   Bacteria, UA MANY (*)    All other components within normal limits  CULTURE, BLOOD (ROUTINE X 2)  CULTURE, BLOOD (ROUTINE X 2)  URINE CULTURE  RESP PANEL BY RT-PCR (FLU A&B, COVID) ARPGX2  LACTIC ACID, PLASMA  CBC WITH DIFFERENTIAL/PLATELET  PROTIME-INR  APTT  LACTIC ACID, PLASMA    DG Chest Port 1 View  Final Result      Medications  cefTRIAXone (ROCEPHIN) 2 g in sodium chloride 0.9 % 100 mL IVPB (2 g Intravenous New Bag/Given 07/08/21 0329)  potassium chloride 10 mEq in 100 mL IVPB (has no administration in time range)  potassium chloride SA (KLOR-CON M) CR tablet 40 mEq (40 mEq Oral Given 07/08/21 0303)     Procedures  /  Critical Care Procedures  ED Course and Medical Decision Making  I have reviewed the triage vital signs, the nursing notes, and pertinent available records from the EMR.  Listed above are  laboratory and imaging tests that I personally ordered, reviewed, and interpreted and then considered in my medical decision making (see below for details).  Fever, altered mental status, initial considerations include UTI, pneumonia, sepsis.  Patient is without meningismus, other than being hard of hearing and possibly mildly confused or an accurate with answering questions, he seems to be alert and oriented, no focal neurological deficits.  Awaiting labs, infectious work-up.     Work-up reveals hypokalemia, UTI.  Will request hospitalist admission.  Barth Kirks. [place] Small, [place]  Emergency [place] [email or web address]  Final Clinical Impressions(s) / ED Diagnoses     ICD-10-CM   1. Acute cystitis without hematuria  N30.00     2. Altered mental status, unspecified altered mental status type  R41.82     3. Hypokalemia  E87.6       ED Discharge Orders     None        Discharge Instructions Discussed with and Provided to Patient:   Discharge Instructions   None       Maudie Flakes, MD 07/08/21 [phone]

## 2021-07-08 NOTE — ED Notes (Signed)
Pt cleaned up, linen changed, and changed into gown.

## 2021-07-08 NOTE — ED Notes (Signed)
Stool incontinence care provided , new adult brief applied .

## 2021-07-08 NOTE — Consult Note (Addendum)
[facility] Nurse Consult Note: Reason for Consult: Consult requested for right face wound.   Reviewed previous progress notes in the EMR and assessed patient in person.  Pt had surgery performed; refer to previous post-op notes: "Pt to operating room on 06/23/2021 for excision of basal cell carcinoma of his face with Dr. Claudia Desanctis with plastic surgery and Dr. Constance Holster with ENT.  He underwent excision of right cheek basal cell carcinoma, right superficial parotidectomy and facial nerve dissection followed by adjacent tissue transfer and tissue rearrangement and application of Integra skin substitute."  Wound type: Pt has a full thickness post-op wound to right face; there is a "bolster dressing" sutured in place, according to the plastic surgery team, this should remain in place for 4 weeks. He recently was seen in the office for a follow-up visit on 12/12.  Please refer to [facility] for further questions regarding plan of care.   Dressing procedure/placement/frequency: Topical treatment orders provided for bedside nurses to perform as follows: Leave dressing intact to to right face; it is sutured in place.  Please re-consult if further assistance is needed.  Thank-you,  Julien Girt MSN, [place], [place], [place], [place]

## 2021-07-08 NOTE — ED Notes (Signed)
Pt called out and stated he needed to have a BM. Pt placed on bedpan at this time.

## 2021-07-09 ENCOUNTER — Other Ambulatory Visit (HOSPITAL_COMMUNITY): Payer: Medicare (Managed Care)

## 2021-07-09 DIAGNOSIS — F02B11 Dementia in other diseases classified elsewhere, moderate, with agitation: Secondary | ICD-10-CM

## 2021-07-09 DIAGNOSIS — G309 Alzheimer's disease, unspecified: Secondary | ICD-10-CM

## 2021-07-09 DIAGNOSIS — R9431 Abnormal electrocardiogram [ECG] [EKG]: Secondary | ICD-10-CM

## 2021-07-09 DIAGNOSIS — I10 Essential (primary) hypertension: Secondary | ICD-10-CM

## 2021-07-09 DIAGNOSIS — B952 Enterococcus as the cause of diseases classified elsewhere: Secondary | ICD-10-CM

## 2021-07-09 DIAGNOSIS — C44319 Basal cell carcinoma of skin of other parts of face: Secondary | ICD-10-CM

## 2021-07-09 DIAGNOSIS — E876 Hypokalemia: Secondary | ICD-10-CM

## 2021-07-09 LAB — URINE CULTURE: Culture: 70000 — AB

## 2021-07-09 LAB — CBC
HCT: 38.9 % — ABNORMAL LOW (ref 39.0–52.0)
Hemoglobin: 13.3 g/dL (ref 13.0–17.0)
MCH: 30.2 pg (ref 26.0–34.0)
MCHC: 34.2 g/dL (ref 30.0–36.0)
MCV: 88.4 fL (ref 80.0–100.0)
Platelets: 190 10*3/uL (ref 150–400)
RBC: 4.4 MIL/uL (ref 4.22–5.81)
RDW: 13.4 % (ref 11.5–15.5)
WBC: 5.3 10*3/uL (ref 4.0–10.5)
nRBC: 0 % (ref 0.0–0.2)

## 2021-07-09 LAB — BASIC METABOLIC PANEL
Anion gap: 9 (ref 5–15)
BUN: 6 mg/dL — ABNORMAL LOW (ref 8–23)
CO2: 27 mmol/L (ref 22–32)
Calcium: 8.5 mg/dL — ABNORMAL LOW (ref 8.9–10.3)
Chloride: 99 mmol/L (ref 98–111)
Creatinine, Ser: 0.79 mg/dL (ref 0.61–1.24)
GFR, Estimated: >60 mL/min (ref >60)
Glucose, Bld: 98 mg/dL (ref 70–99)
Potassium: 3.1 mmol/L — ABNORMAL LOW (ref 3.5–5.1)
Sodium: 135 mmol/L (ref 135–145)

## 2021-07-09 LAB — MAGNESIUM: Magnesium: 1.7 mg/dL (ref 1.7–2.4)

## 2021-07-09 MED ORDER — CHLORHEXIDINE GLUCONATE CLOTH 2 % EX PADS
6.0000 | MEDICATED_PAD | Freq: Every day | CUTANEOUS | Status: DC
  Administered 2021-07-09 – 2021-07-10 (×2): 6 via TOPICAL

## 2021-07-09 MED ORDER — POTASSIUM CHLORIDE CRYS ER 20 MEQ PO TBCR
40.0000 meq | EXTENDED_RELEASE_TABLET | ORAL | Status: AC
  Administered 2021-07-09 (×2): 40 meq via ORAL
  Filled 2021-07-09 (×2): qty 2

## 2021-07-09 MED ORDER — SODIUM CHLORIDE 0.9 % IV SOLN
1.0000 g | Freq: Four times a day (QID) | INTRAVENOUS | Status: DC
  Administered 2021-07-09 – 2021-07-10 (×4): 1 g via INTRAVENOUS
  Filled 2021-07-09 (×8): qty 1000

## 2021-07-09 MED ORDER — MAGNESIUM SULFATE IN D5W 1-5 GM/100ML-% IV SOLN
1.0000 g | Freq: Once | INTRAVENOUS | Status: AC
  Administered 2021-07-09: 17:00:00 1 g via INTRAVENOUS
  Filled 2021-07-09: qty 100

## 2021-07-09 NOTE — Evaluation (Signed)
Physical Therapy Evaluation Patient Details Name: Jeffrey Zimmerman MRN: [id number] DOB: 01/04/1936 Today's Date: 07/09/2021  History of Present Illness  Jeffrey Zimmerman is a 85 y.o. male presents by EMS from SNF with complaint of fever.Patient is also had increased confusion and abnormal behavior and being more aggressive towards staff. Found to have Complicated UTI. PHMx:significant for hypertension, basal cell carcinoma, CAD, Alzheimer's dementia.  Clinical Impression  Pt admitted with above diagnosis. Pt was able to ambulate with RW with min assist with occasional steadying assist needed. Pt from SNF and most likely needs to return to SNF with therapy services.  Pt currently with functional limitations due to the deficits listed below (see PT Problem List). Pt will benefit from skilled PT to increase their independence and safety with mobility to allow discharge to the venue listed below.          Recommendations for follow up therapy are one component of a multi-disciplinary discharge planning process, led by the attending physician.  Recommendations may be updated based on patient status, additional functional criteria and insurance authorization.  Follow Up Recommendations Skilled nursing-short term rehab (<3 hours/day)    Assistance Recommended at Discharge Intermittent Supervision/Assistance  Functional Status Assessment Patient has had a recent decline in their functional status and demonstrates the ability to make significant improvements in function in a reasonable and predictable amount of time.  Equipment Recommendations  None recommended by PT    Recommendations for Other Services       Precautions / Restrictions Precautions Precautions: Fall Restrictions Weight Bearing Restrictions: No      Mobility  Bed Mobility Overal bed mobility: Needs Assistance Bed Mobility: Supine to Sit     Supine to sit: Min guard;HOB elevated     General bed mobility comments: Pt in  chair.    Transfers Overall transfer level: Needs assistance Equipment used: Rolling walker (2 wheels) Transfers: Sit to/from Stand Sit to Stand: Min assist           General transfer comment: A little asssist with sit to stand and cues for hand placement    Ambulation/Gait Ambulation/Gait assistance: Min guard Gait Distance (Feet): 75 Feet Assistive device: Rolling walker (2 wheels) Gait Pattern/deviations: Step-through pattern;Decreased stride length;Trunk flexed;Wide base of support   Gait velocity interpretation: <1.31 ft/sec, indicative of household ambulator   General Gait Details: Pt is fairly safe wtih RW with occasiional incr sway needing steadying assist.  Stairs            Wheelchair Mobility    Modified Rankin (Stroke Patients Only)       Balance Overall balance assessment: Needs assistance Sitting-balance support: No upper extremity supported;Feet supported Sitting balance-Leahy Scale: Good     Standing balance support: Bilateral upper extremity supported;Reliant on assistive device for balance Standing balance-Leahy Scale: Poor Standing balance comment: Relies on UE support                             Pertinent Vitals/Pain Pain Assessment: No/denies pain    Home Living Family/patient expects to be discharged to:: Skilled nursing facility Living Arrangements: Alone Available Help at Discharge: [place] Access: Level entry       Home Layout: One level Home Equipment: Conservation officer, nature (2 wheels)      Prior Function Prior Level of Function : Independent/Modified Independent             Mobility Comments: Per pt  he is Mod I with a RW ADLs Comments: Per pt he can do his own basic ADLs     Hand Dominance   Dominant Hand: Right    Extremity/Trunk Assessment   Upper Extremity Assessment Upper Extremity Assessment: Defer to OT evaluation    Lower Extremity Assessment Lower Extremity  Assessment: Generalized weakness    Cervical / Trunk Assessment Cervical / Trunk Assessment: Kyphotic  Communication   Communication: No difficulties  Cognition Arousal/Alertness: Awake/alert Behavior During Therapy: WFL for tasks assessed/performed Overall Cognitive Status: History of cognitive impairments - at baseline                                          General Comments      Exercises General Exercises - Lower Extremity Ankle Circles/Pumps: AROM;Both;5 reps;Seated Long Arc Quad: AROM;Both;10 reps;Seated   Assessment/Plan    PT Assessment Patient needs continued PT services  PT Problem List Decreased activity tolerance;Decreased balance;Decreased mobility;Decreased knowledge of use of DME;Decreased safety awareness;Decreased knowledge of precautions       PT Treatment Interventions DME instruction;Gait training;Functional mobility training;Therapeutic activities;Therapeutic exercise;Balance training;Patient/family education    PT Goals (Current goals can be found in the Care Plan section)  Acute Rehab PT Goals Patient Stated Goal: to go home PT Goal Formulation: With patient Time For Goal Achievement: 07/23/21 Potential to Achieve Goals: Good    Frequency Min 3X/week   Barriers to discharge Decreased caregiver support      Co-evaluation               AM-PAC PT "6 Clicks" Mobility  Outcome Measure Help needed turning from your back to your side while in a flat bed without using bedrails?: A Little Help needed moving from lying on your back to sitting on the side of a flat bed without using bedrails?: A Little Help needed moving to and from a bed to a chair (including a wheelchair)?: A Lot Help needed standing up from a chair using your arms (e.g., wheelchair or bedside chair)?: A Little Help needed to walk in hospital room?: A Lot Help needed climbing 3-5 steps with a railing? : Total 6 Click Score: 14    End of Session Equipment  Utilized During Treatment: Gait belt Activity Tolerance: Patient tolerated treatment well Patient left: in chair;with call bell/phone within reach;with chair alarm set Nurse Communication: Mobility status PT Visit Diagnosis: Muscle weakness (generalized) (M62.81)    Time: 1345-1400 PT Time Calculation (min) (ACUTE ONLY): 15 min   Charges:   PT Evaluation $PT Eval Moderate Complexity: 1 Mod          Kayan Blissett M,PT Acute Rehab Services [phone] (pager)   Alvira Philips 07/09/2021, 2:08 PM

## 2021-07-09 NOTE — Progress Notes (Signed)
PROGRESS NOTE  FOX SALMINEN IBB:[id number] DOB: 13-Jul-1936   PCP: [name], MD  Patient is from: SNF  DOA: 07/08/2021 LOS: 1  Chief complaints:  Chief Complaint  Patient presents with   Fever / Conjunctivitis     Brief Narrative / Interim history: 85 year old M with PMH of HTN, BCC s/p excision surgery on 12/5, CAD, Alzheimer's dementia and physical deconditioning presenting with increased confusion, fever and aggression, and admitted for acute metabolic encephalopathy in the setting of complicated UTI, and severe hypokalemia.  UA concerning for UTI.  Urine culture obtained, and he was started on IV ceftriaxone.  Subjective: Seen and examined earlier this morning.  No major events overnight of this morning.  No complaints but he admits to dysuria.  He is oriented x4 except date but confused at times asking me if I still stay in his apartment.  He also has diminished hearing.  He responds no to pain.  Objective: Vitals:   07/08/21 2132 07/09/21 0134 07/09/21 0500 07/09/21 0756  BP: (!) 171/86 (!) 146/72 (!) 158/73 (!) 160/87  Pulse: 91 73 65 69  Resp: 17 17 16 18   Temp: 97.6 F (36.4 C) 97.9 F (36.6 C) 97.8 F (36.6 C) 97.9 F (36.6 C)  TempSrc: Oral Oral Oral   SpO2: 97% 98% 97% 98%  Weight:      Height:        Examination:  GENERAL: No apparent distress.  Nontoxic. HEENT: MMM.  Dressing to right face at the excision site NECK: Supple.  No apparent JVD.  RESP:  No IWOB.  Fair aeration bilaterally. CVS:  RRR. Heart sounds normal.  ABD/GI/GU: BS+. Abd soft, NTND.  MSK/EXT:  Moves extremities. No apparent deformity. No edema.  SKIN: no apparent skin lesion or wound NEURO: Awake and alert.  Oriented x4 except date but confused at times.  No apparent focal neuro deficit. PSYCH: Calm. Normal affect.   Procedures:  None  Microbiology summarized: [id number] and influenza PCR nonreactive. Blood cultures NGTD. Urine culture with pansensitive Enterococcus  faecalis  Assessment & Plan: Complicated Enterococcus faecalis UTI-urine culture as above. -Discontinue ceftriaxone -Start IV Unasyn.   Severe Hypokalemia: K3.1.  Mg 1.7. -P.o. KCl 40x2 -IV magnesium sulfate 1 g x 1  Acute metabolic encephalopathy: Likely due to UTI. Dementia with behavioral disturbance: was aggressive at facility.  Looks calm this morning. -Treat UTI as above -Reorientation and delirium precautions   Hypertension -Continue norvasc and lisinopril hct. Monitor BP.    Prolonged QT interval -Avoid medications which could further prolong QT interval -Hold lexapro    History of BCC s/p recent excision surgery by ENT on 06/23/2021 -continue with eyedrops. -Keep dressing in place -Outpatient follow-up with ENT/surgery  Class I obesity Body mass index is 31.84 kg/m.         DVT prophylaxis:  enoxaparin (LOVENOX) injection 40 mg Start: 07/08/21 1000  Code Status: Full code Family Communication: Patient and/or RN. Available if any question.  Level of care: Telemetry Medical Status is: Inpatient  Remains inpatient appropriate because: IV antibiotics and electrolyte abnormality   Final disposition: Likely back to SNF on 07/10/2021    Consultants:  None   Sch Meds:  Scheduled Meds:  amLODipine  5 mg Oral Daily   cholecalciferol  2,000 Units Oral q AM   clopidogrel  75 mg Oral Daily   dutasteride  0.5 mg Oral Daily   enoxaparin (LOVENOX) injection  40 mg Subcutaneous Q24H   erythromycin  1 application Right  Eye QID   lisinopril  20 mg Oral Daily   And   hydrochlorothiazide  25 mg Oral Daily   lactase  3,000 Units Oral TID WC   lactose free nutrition  237 mL Oral TID BM   metoprolol tartrate  12.5 mg Oral BID   pravastatin  20 mg Oral Daily   tamsulosin  0.8 mg Oral QHS   Continuous Infusions:  ampicillin (OMNIPEN) IV     lactated ringers 75 mL/hr at 07/09/21 1240   PRN Meds:.acetaminophen **OR** acetaminophen, naphazoline-glycerin,  senna-docusate  Antimicrobials: Anti-infectives (From admission, onward)    Start     Dose/Rate Route Frequency Ordered Stop   07/09/21 1330  ampicillin (OMNIPEN) 1 g in sodium chloride 0.9 % 100 mL IVPB        1 g 300 mL/hr over 20 Minutes Intravenous Every 6 hours 07/09/21 1242     07/09/21 0100  cefTRIAXone (ROCEPHIN) 1 g in sodium chloride 0.9 % 100 mL IVPB  Status:  Discontinued        1 g 200 mL/hr over 30 Minutes Intravenous Every 24 hours 07/08/21 0931 07/09/21 1242   07/08/21 0330  cefTRIAXone (ROCEPHIN) 2 g in sodium chloride 0.9 % 100 mL IVPB        2 g 200 mL/hr over 30 Minutes Intravenous  Once 07/08/21 0318 07/08/21 0402        I have personally reviewed the following labs and images: CBC: Recent Labs  Lab 07/08/21 0200 07/09/21 0233  WBC 7.2 5.3  NEUTROABS 5.6  --   HGB 15.0 13.3  HCT 43.1 38.9*  MCV 88.3 88.4  PLT 205 190   BMP &GFR Recent Labs  Lab 07/08/21 0200 07/08/21 0401 07/08/21 1440 07/09/21 0233  NA 136  --   --  135  K 2.6*  --  4.0 3.1*  CL 95*  --   --  99  CO2 28  --   --  27  GLUCOSE 141*  --   --  98  BUN 17  --   --  6*  CREATININE 0.96  --   --  0.79  CALCIUM 9.0  --   --  8.5*  MG  --  1.7  --  1.7   Estimated Creatinine Clearance: 78.2 mL/min (by C-G formula based on SCr of 0.79 mg/dL). Liver & Pancreas: Recent Labs  Lab 07/08/21 0200  AST 22  ALT 24  ALKPHOS 75  BILITOT 2.2*  PROT 6.4*  ALBUMIN 3.4*   No results for input(s): LIPASE, AMYLASE in the last 168 hours. No results for input(s): AMMONIA in the last 168 hours. Diabetic: No results for input(s): HGBA1C in the last 72 hours. No results for input(s): GLUCAP in the last 168 hours. Cardiac Enzymes: No results for input(s): CKTOTAL, CKMB, CKMBINDEX, TROPONINI in the last 168 hours. No results for input(s): PROBNP in the last 8760 hours. Coagulation Profile: Recent Labs  Lab 07/08/21 0200  INR 1.1   Thyroid Function Tests: No results for input(s): TSH,  T4TOTAL, FREET4, T3FREE, THYROIDAB in the last 72 hours. Lipid Profile: No results for input(s): CHOL, HDL, LDLCALC, TRIG, CHOLHDL, LDLDIRECT in the last 72 hours. Anemia Panel: No results for input(s): VITAMINB12, FOLATE, FERRITIN, TIBC, IRON, RETICCTPCT in the last 72 hours. Urine analysis:    Component Value Date/Time   COLORURINE YELLOW 07/08/2021 0159   APPEARANCEUR CLOUDY (A) 07/08/2021 0159   LABSPEC 1.025 07/08/2021 0159   PHURINE 5.5 07/08/2021 0159  GLUCOSEU NEGATIVE 07/08/2021 0159   HGBUR MODERATE (A) 07/08/2021 0159   BILIRUBINUR MODERATE (A) 07/08/2021 0159   KETONESUR 15 (A) 07/08/2021 0159   PROTEINUR 30 (A) 07/08/2021 0159   NITRITE NEGATIVE 07/08/2021 0159   LEUKOCYTESUR MODERATE (A) 07/08/2021 0159   Sepsis Labs: Invalid input(s): PROCALCITONIN, [place]  Microbiology: Recent Results (from the past 240 hour(s))  Blood Culture (routine x 2)     Status: None (Preliminary result)   Collection Time: 07/08/21  1:59 AM   Specimen: BLOOD  Result Value Ref Range Status   Specimen Description BLOOD RIGHT HAND  Final   Special Requests   Final    BOTTLES DRAWN AEROBIC AND ANAEROBIC Blood Culture adequate volume   Culture   Final    NO GROWTH 1 DAY Performed at [facility], [address], [place], [place] [address]    Report Status PENDING  Incomplete  Urine Culture     Status: Abnormal   Collection Time: 07/08/21  1:59 AM   Specimen: In/Out Cath Urine  Result Value Ref Range Status   Specimen Description IN/OUT CATH URINE  Final   Special Requests   Final    NONE Performed at [facility], [place] [address] [place], [place] [address]    Culture 70,000 COLONIES/mL ENTEROCOCCUS FAECALIS (A)  Final   Report Status 07/09/2021 FINAL  Final   Organism ID, Bacteria ENTEROCOCCUS FAECALIS (A)  Final      Susceptibility   Enterococcus faecalis - MIC*    AMPICILLIN <=2 SENSITIVE Sensitive     NITROFURANTOIN <=16 SENSITIVE Sensitive     VANCOMYCIN 1  SENSITIVE Sensitive     * 70,000 COLONIES/mL ENTEROCOCCUS FAECALIS  Blood Culture (routine x 2)     Status: None (Preliminary result)   Collection Time: 07/08/21  2:05 AM   Specimen: BLOOD  Result Value Ref Range Status   Specimen Description BLOOD LEFT HAND  Final   Special Requests   Final    BOTTLES DRAWN AEROBIC AND ANAEROBIC Blood Culture adequate volume   Culture   Final    NO GROWTH 1 DAY Performed at [facility], [address] [place], [place] [address]    Report Status PENDING  Incomplete  Resp Panel by RT-PCR (Flu A&B, Covid) Nasopharyngeal Swab     Status: None   Collection Time: 07/08/21  2:15 AM   Specimen: Nasopharyngeal Swab; Nasopharyngeal(NP) swabs in vial transport medium  Result Value Ref Range Status   SARS Coronavirus 2 by RT PCR NEGATIVE NEGATIVE Final    Comment: (NOTE) SARS-CoV-2 target nucleic acids are NOT DETECTED.  The SARS-CoV-2 RNA is generally detectable in upper respiratory specimens during the acute phase of infection. The lowest concentration of SARS-CoV-2 viral copies this assay can detect is 138 copies/mL. A negative result does not preclude SARS-Cov-2 infection and should not be used as the sole basis for treatment or other patient management decisions. A negative result may occur with  improper specimen collection/handling, submission of specimen other than nasopharyngeal swab, presence of viral mutation(s) within the areas targeted by this assay, and inadequate number of viral copies(<138 copies/mL). A negative result must be combined with clinical observations, patient history, and epidemiological information. The expected result is Negative.  Fact Sheet for Patients:  EntrepreneurPulse.com.au  Fact Sheet for Healthcare Providers:  IncredibleEmployment.be  This test is no t yet approved or cleared by the [place] FDA and  has been authorized for detection and/or diagnosis of SARS-CoV-2  by FDA under an Emergency Use Authorization (EUA). This EUA will remain  in effect (meaning this test can be used) for the duration of the COVID-19 declaration under Section 564(b)(1) of the Act, 21 U.S.C.section 360bbb-3(b)(1), unless the authorization is terminated  or revoked sooner.       Influenza A by PCR NEGATIVE NEGATIVE Final   Influenza B by PCR NEGATIVE NEGATIVE Final    Comment: (NOTE) The Xpert Xpress SARS-CoV-2/FLU/RSV plus assay is intended as an aid in the diagnosis of influenza from Nasopharyngeal swab specimens and should not be used as a sole basis for treatment. Nasal washings and aspirates are unacceptable for Xpert Xpress SARS-CoV-2/FLU/RSV testing.  Fact Sheet for Patients: EntrepreneurPulse.com.au  Fact Sheet for Healthcare Providers: IncredibleEmployment.be  This test is not yet approved or cleared by the [place] FDA and has been authorized for detection and/or diagnosis of SARS-CoV-2 by FDA under an Emergency Use Authorization (EUA). This EUA will remain in effect (meaning this test can be used) for the duration of the COVID-19 declaration under Section 564(b)(1) of the Act, 21 U.S.C. section 360bbb-3(b)(1), unless the authorization is terminated or revoked.  Performed at [facility], [place] [address] [place], [place] [address]     Radiology Studies: No results found.     Kentavius Dettore T. Mayes  If 7PM-7AM, please contact night-coverage [email or web address] 07/09/2021, 12:46 PM

## 2021-07-09 NOTE — Evaluation (Signed)
Occupational Therapy Evaluation Patient Details Name: Jeffrey Zimmerman MRN: [id number] DOB: 1936/01/23 Today's Date: 07/09/2021   History of Present Illness Jeffrey Zimmerman is a 85 y.o. male presents by EMS from SNF with complaint of fever.Patient is also had increased confusion and abnormal behavior and being more aggressive towards staff. Found to have Complicated UTI. PHMx:significant for hypertension, basal cell carcinoma, CAD, Alzheimer's dementia.   Clinical Impression   This 85 yo male admitted with above presents to acute with PLOF per his report of being Mod I to independent with basic ADLs at a RW level. He currently is at a setup-min A level for all basic ADLs and Min A for ambulation with RW. He will continue to benefit from acute OT with follow up at SNF (currently needs A anytime he is up on his feet).       Recommendations for follow up therapy are one component of a multi-disciplinary discharge planning process, led by the attending physician.  Recommendations may be updated based on patient status, additional functional criteria and insurance authorization.   Follow Up Recommendations  Skilled nursing-short term rehab (<3 hours/day)    Assistance Recommended at Discharge Frequent or constant Supervision/Assistance  Functional Status Assessment  Patient has had a recent decline in their functional status and demonstrates the ability to make significant improvements in function in a reasonable and predictable amount of time.  Equipment Recommendations  Other (comment) (TBD at SNF)       Precautions / Restrictions Precautions Precautions: Fall Restrictions Weight Bearing Restrictions: No      Mobility Bed Mobility Overal bed mobility: Needs Assistance Bed Mobility: Supine to Sit     Supine to sit: Min guard;HOB elevated          Transfers Overall transfer level: Needs assistance Equipment used: Rolling walker (2 wheels) Transfers: Sit to/from Stand Sit to  Stand: Min assist                  Balance Overall balance assessment: Needs assistance Sitting-balance support: No upper extremity supported;Feet supported Sitting balance-Leahy Scale: Good     Standing balance support: Bilateral upper extremity supported;Reliant on assistive device for balance Standing balance-Leahy Scale: Poor                             ADL either performed or assessed with clinical judgement   ADL Overall ADL's : Needs assistance/impaired Eating/Feeding: Independent;Sitting   Grooming: Set up;Sitting   Upper Body Bathing: Set up;Sitting   Lower Body Bathing: Minimal assistance;Sit to/from stand   Upper Body Dressing : Set up;Sitting   Lower Body Dressing: Minimal assistance;Sit to/from stand   Toilet Transfer: Minimal assistance;Ambulation;Rolling walker (2 wheels) Toilet Transfer Details (indicate cue type and reason): simulated with RW to door and back to recliner Toileting- Clothing Manipulation and Hygiene: Minimal assistance;Sit to/from stand               Vision Baseline Vision/History: 1 Wears glasses Ability to See in Adequate Light: 0 Adequate              Pertinent Vitals/Pain Pain Assessment: No/denies pain     Hand Dominance Right   Extremity/Trunk Assessment Upper Extremity Assessment Upper Extremity Assessment: Overall WFL for tasks assessed           Communication Communication Communication: No difficulties   Cognition Arousal/Alertness: Awake/alert Behavior During Therapy: WFL for tasks assessed/performed Overall Cognitive Status: History of cognitive impairments -  at [place] expects to be discharged to:: Skilled nursing facility   Available Help at Discharge: [place] Access: Level entry     Home Layout: One level               [place]: Conservation officer, nature  (2 wheels)          Prior Functioning/Environment Prior Level of Function : Independent/Modified Independent             Mobility Comments: Per pt he is Mod I with a RW ADLs Comments: Per pt he can do his own basic ADLs        OT Problem List: Impaired balance (sitting and/or standing);Decreased cognition      OT Treatment/Interventions: Self-care/ADL training;DME and/or AE instruction;Patient/family education;Balance training    OT Goals(Current goals can be found in the care plan section) Acute Rehab OT Goals Patient Stated Goal: to get up and move around OT Goal Formulation: With patient Time For Goal Achievement: 07/23/21 Potential to Achieve Goals: Good  OT Frequency: Min 2X/week              AM-PAC OT "6 Clicks" Daily Activity     Outcome Measure Help from another person eating meals?: None Help from another person taking care of personal grooming?: A Little Help from another person toileting, which includes using toliet, bedpan, or urinal?: A Little Help from another person bathing (including washing, rinsing, drying)?: A Little Help from another person to put on and taking off regular upper body clothing?: A Little Help from another person to put on and taking off regular lower body clothing?: A Little 6 Click Score: 19   End of Session Equipment Utilized During Treatment: Gait belt;Rolling walker (2 wheels) Nurse Communication: Mobility status  Activity Tolerance: Patient tolerated treatment well Patient left: in chair;with call bell/phone within reach;with chair alarm set  OT Visit Diagnosis: Unsteadiness on feet (R26.81);Other abnormalities of gait and mobility (R26.89);Muscle weakness (generalized) (M62.81);Other symptoms and signs involving cognitive function                Time: 1008-1036 OT Time Calculation (min): 28 min Charges:  OT General Charges $OT Visit: 1 Visit OT Evaluation $OT Eval Moderate Complexity: 1 Mod OT Treatments $Self  Care/Home Management : 8-22 mins  Jeffrey Zimmerman, Jeffrey Zimmerman Acute [employer] Pager [phone] Office [phone]    Jeffrey Zimmerman 07/09/2021, 11:23 AM

## 2021-07-09 NOTE — TOC Initial Note (Signed)
Transition of Care [facility]) - Initial/Assessment Note    Patient Details  Name: Jeffrey Zimmerman MRN: [id number] Date of Birth: [birth year]  [facility]) CM/SW Contact:    Emeterio Reeve, LCSW Phone Number: 07/09/2021, 4:18 PM  Clinical Narrative:                  Pt is SNF at [facility] and is able to return at discharge. The SNF can accept pt back Tomorrow or he will have to wait until after next Tuesday to return. CSW left message with [facility] social worker Olivia Mackie to call CSW back.    Expected Discharge Plan: Skilled Nursing Facility Barriers to Discharge: Continued Medical Work up   Patient Goals and CMS Choice Patient states their goals for this hospitalization and ongoing recovery are:: to build strenght      Expected Discharge Plan and Services Expected Discharge Plan: [place]       Living arrangements for the past 2 months: [place]                                      Prior Living Arrangements/Services Living arrangements for the past 2 months: [place] Lives with:: Facility Resident Patient language and need for interpreter reviewed:: Yes Do you feel safe going back to the place where you live?: Yes      Need for Family Participation in Patient Care: Yes (Comment) Care giver support system in place?: Yes (comment)   Criminal Activity/Legal Involvement Pertinent to Current Situation/Hospitalization: No - Comment as needed  Activities of Daily Living      Permission Sought/Granted   Permission granted to share information with : Yes, Verbal Permission Granted     Permission granted to share info w AGENCY: [facility], SNF        Emotional Assessment Appearance:: Appears stated age Attitude/Demeanor/Rapport: Engaged Affect (typically observed): Appropriate Orientation: : Oriented to Self, Oriented to Place, Oriented to  Time, Oriented to Situation Alcohol / Substance Use: Not Applicable Psych  Involvement: No (comment)  Admission diagnosis:  Hypokalemia [E87.6] Acute cystitis without hematuria [H08.65] Complicated UTI (urinary tract infection) [N39.0] Altered mental status, unspecified altered mental status type [R41.82] Patient Active Problem List   Diagnosis Date Noted   Complicated UTI (urinary tract infection) 07/08/2021   Hypokalemia 07/08/2021   Prolonged QT interval 07/08/2021   Alzheimer's dementia ([place]) 07/08/2021   Basal cell carcinoma (BCC) 06/23/2021   Bilateral lower extremity edema 04/10/2016   Old myocardial infarction 08/10/2013   Mixed hyperlipidemia 08/10/2013   Other malaise and fatigue 08/08/2013   Hypertension    Coronary artery disease    Cancer ([place])    Basal cell carcinoma    Gout    Irritable bowel syndrome    SVT (supraventricular tachycardia) ([place])    PCP:  [name], MD Pharmacy:  No Pharmacies Listed    Social Determinants of Health (SDOH) Interventions    Readmission Risk Interventions No flowsheet data found.  Emeterio Reeve, LCSW Clinical Social Worker

## 2021-07-10 DIAGNOSIS — G9341 Metabolic encephalopathy: Secondary | ICD-10-CM

## 2021-07-10 LAB — RENAL FUNCTION PANEL
Albumin: 2.9 g/dL — ABNORMAL LOW (ref 3.5–5.0)
Anion gap: 6 (ref 5–15)
BUN: 5 mg/dL — ABNORMAL LOW (ref 8–23)
CO2: 28 mmol/L (ref 22–32)
Calcium: 8.5 mg/dL — ABNORMAL LOW (ref 8.9–10.3)
Chloride: 101 mmol/L (ref 98–111)
Creatinine, Ser: 0.83 mg/dL (ref 0.61–1.24)
GFR, Estimated: >60 mL/min (ref >60)
Glucose, Bld: 117 mg/dL — ABNORMAL HIGH (ref 70–99)
Phosphorus: 3 mg/dL (ref 2.5–4.6)
Potassium: 3.4 mmol/L — ABNORMAL LOW (ref 3.5–5.1)
Sodium: 135 mmol/L (ref 135–145)

## 2021-07-10 LAB — MAGNESIUM: Magnesium: 1.7 mg/dL (ref 1.7–2.4)

## 2021-07-10 MED ORDER — LISINOPRIL 20 MG PO TABS: 20.0000 mg | ORAL_TABLET | Freq: Every day | ORAL | Status: DC

## 2021-07-10 MED ORDER — AMOXICILLIN 500 MG PO CAPS: 500.0000 mg | ORAL_CAPSULE | Freq: Three times a day (TID) | ORAL | 0 refills | Status: AC

## 2021-07-10 NOTE — TOC Transition Note (Signed)
Transition of Care [facility]) - CM/SW Discharge Note   Patient Details  Name: Jeffrey Zimmerman MRN: [id number] Date of Birth: 07-13-1936  Transition of Care [facility]) CM/SW Contact:  Emeterio Reeve, LCSW Phone Number: 07/10/2021, 11:28 AM   Clinical Narrative:     Per MD patient ready for DC to [facility]. RN, patient, patient's family, and facility notified of DC. Discharge Summary and FL2 sent to facility. DC packet on chart. [facility] will transport pt to facility.    RN to call report to [phone].  CSW will sign off for now as social work intervention is no longer needed. Please consult [place] again if new needs arise.   Final next level of care: Skilled Nursing Facility Barriers to Discharge: Barriers Resolved   Patient Goals and CMS Choice Patient states their goals for this hospitalization and ongoing recovery are:: to build strenght      Discharge Placement              Patient [facility] bed at: [facility] Patient to be transferred to facility by: ptar Name of family member notified: pt aware Patient and family notified of of transfer: 07/10/21  Discharge Plan and Services                                     Social Determinants of Health (SDOH) Interventions     Readmission Risk Interventions No flowsheet data found.   Emeterio Reeve, LCSW Clinical Social Worker

## 2021-07-10 NOTE — Assessment & Plan Note (Signed)
Chronic Problem

## 2021-07-10 NOTE — Plan of Care (Signed)

## 2021-07-10 NOTE — NC FL2 (Signed)
[place] LEVEL OF CARE SCREENING TOOL     IDENTIFICATION  Patient Name: Jeffrey Zimmerman Birthdate: 1936/02/12 Sex: male Admission Date (Current Location): 07/08/2021  [facility] and [place] Number:  Herbalist and Address:  [facility] [place], [place] [address] [place], [place] [address]      Provider Number: [id number]  Attending Physician Name and Address:  Mercy Riding, MD  Relative Name and Phone Number:       Current Level of Care: Hospital Recommended Level of Care: [place] Prior Approval Number:    Date Approved/Denied:   PASRR Number: [id number]  Discharge Plan: SNF    Current Diagnoses: Patient Active Problem List   Diagnosis Date Noted   Complicated UTI (urinary tract infection) 07/08/2021   Hypokalemia 07/08/2021   Prolonged QT interval 07/08/2021   Alzheimer's dementia ([place]) 07/08/2021   Basal cell carcinoma (BCC) 06/23/2021   Bilateral lower extremity edema 04/10/2016   Old myocardial infarction 08/10/2013   Mixed hyperlipidemia 08/10/2013   Other malaise and fatigue 08/08/2013   Hypertension    Coronary artery disease    Cancer (HCC)    Basal cell carcinoma    Gout    Irritable bowel syndrome    SVT (supraventricular tachycardia) (HCC)     Orientation RESPIRATION BLADDER Height & Weight     Self, Situation, Place, Time  Normal External catheter Weight: 209 lb 7 oz (95 kg) Height:  5\' 8"  (172.7 cm)  BEHAVIORAL SYMPTOMS/MOOD NEUROLOGICAL BOWEL NUTRITION STATUS      Continent Diet  AMBULATORY STATUS COMMUNICATION OF NEEDS Skin   Limited Assist Verbally Normal                       Personal Care Assistance Level of Assistance  Bathing, Feeding, Dressing Bathing Assistance: Limited assistance Feeding assistance: Independent Dressing Assistance: Limited assistance     Functional Limitations Info  Hearing, Sight, Speech Sight Info: Adequate Hearing Info: Adequate Speech Info:  Adequate    SPECIAL CARE FACTORS FREQUENCY  PT (By licensed PT), OT (By licensed OT)     PT Frequency: 5x a week OT Frequency: 5x week            Contractures Contractures Info: Not present    Additional Factors Info  Code Status, Allergies Code Status Info: Full Allergies Info: codeine           Current Medications (07/10/2021):  This is the current hospital active medication list Current Facility-Administered Medications  Medication Dose Route Frequency Provider Last Rate Last Admin   acetaminophen (TYLENOL) tablet 650 mg  650 mg Oral Q6H PRN Chotiner, Yevonne Aline, MD   650 mg at 07/09/21 2106   Or   acetaminophen (TYLENOL) suppository 650 mg  650 mg Rectal Q6H PRN Chotiner, Yevonne Aline, MD       amLODipine (NORVASC) tablet 5 mg  5 mg Oral Daily Chotiner, Yevonne Aline, MD   5 mg at 07/10/21 5035   ampicillin (OMNIPEN) 1 g in sodium chloride 0.9 % 100 mL IVPB  1 g Intravenous Q6H Wendee Beavers T, MD 300 mL/hr at 07/10/21 0513 1 g at 07/10/21 4656   Chlorhexidine Gluconate Cloth 2 % PADS 6 each  6 each Topical Daily Mercy Riding, MD   6 each at 07/10/21 8127   cholecalciferol (VITAMIN D3) tablet 2,000 Units  2,000 Units Oral q AM Elgergawy, Silver Huguenin, MD   2,000 Units at 07/10/21 [phone]  clopidogrel (PLAVIX) tablet 75 mg  75 mg Oral Daily Chotiner, Yevonne Aline, MD   75 mg at 07/10/21 9179   dutasteride (AVODART) capsule 0.5 mg  0.5 mg Oral Daily Elgergawy, Silver Huguenin, MD   0.5 mg at 07/10/21 1505   enoxaparin (LOVENOX) injection 40 mg  40 mg Subcutaneous Q24H Chotiner, Yevonne Aline, MD   40 mg at 07/10/21 0825   erythromycin ophthalmic ointment 1 application  1 application Right Eye QID Elgergawy, Silver Huguenin, MD   1 application at 69/79/48 0165   lactase (LACTAID) tablet 3,000 Units  3,000 Units Oral TID WC Elgergawy, Silver Huguenin, MD   3,000 Units at 07/10/21 5374   lactose free nutrition (Boost) liquid 237 mL  237 mL Oral TID BM Elgergawy, Silver Huguenin, MD   237 mL at 07/10/21 0825   lisinopril  (ZESTRIL) tablet 20 mg  20 mg Oral Daily Chotiner, Yevonne Aline, MD   20 mg at 07/10/21 8270   metoprolol tartrate (LOPRESSOR) tablet 12.5 mg  12.5 mg Oral BID Elgergawy, Silver Huguenin, MD   12.5 mg at 07/10/21 7867   naphazoline-glycerin (CLEAR EYES REDNESS) ophth solution 1 drop  1 drop Right Eye QID PRN Elgergawy, Silver Huguenin, MD       pravastatin (PRAVACHOL) tablet 20 mg  20 mg Oral Daily Chotiner, Yevonne Aline, MD   20 mg at 07/10/21 5449   senna-docusate (Senokot-S) tablet 1 tablet  1 tablet Oral QHS PRN Chotiner, Yevonne Aline, MD       tamsulosin [facility]) capsule 0.8 mg  0.8 mg Oral QHS Chotiner, Yevonne Aline, MD   0.8 mg at 07/09/21 2107     Discharge Medications: Please see discharge summary for a list of discharge medications.  Relevant Imaging Results:  Relevant Lab Results:   Additional Information SSN [id number]  Emeterio Reeve, [place]

## 2021-07-10 NOTE — Progress Notes (Signed)
Mobility Specialist Progress Note:   07/10/21 1100  Mobility  Activity Ambulated in hall  Level of Assistance Minimal assist, patient does 75% or more  Assistive Device Front wheel walker  Distance Ambulated (ft) 220 ft  Mobility Ambulated with assistance in hallway  Mobility Response Tolerated well  Mobility performed by Mobility specialist  $Mobility charge 1 Mobility   Pt required heavy minA to stand from EOB. Pt c/o genrealized weakness today, otherwise asx during ambulation. Requested to use BR before back to bed, BM successful. Pt back in bed with bed alarm on.   Nelta Numbers Mobility Specialist  Phone [phone]

## 2021-07-10 NOTE — Hospital Course (Signed)
85 year old M with PMH of HTN, BCC s/p excision surgery on 12/5, CAD, Alzheimer's dementia and physical deconditioning presenting with increased confusion, fever and aggression, and admitted for acute metabolic encephalopathy in the setting of complicated UTI, and severe hypokalemia.  UA concerning for UTI.

## 2021-07-10 NOTE — Progress Notes (Signed)
Discharge to [facility], transported by [facility]. Patient is alert and oriented, not in any distress, foley in place, IV out. Discharge paper given to [facility] representative.

## 2021-07-10 NOTE — Discharge Summary (Signed)
Physician Discharge Summary  Jeffrey Zimmerman IDP:[id number] DOB: 18-Aug-1935 DOA: 07/08/2021  PCP: [name], MD  Admit date: 07/08/2021 Discharge date: 07/10/2021 Admitted From: SNF Disposition: SNF Recommendations for Outpatient Follow-up:  Follow ups as below. Please obtain CBC/BMP/Mag at follow up Please follow up on the following pending results: None  Discharge Condition: Stable CODE STATUS: Full code  Hospital Course: 85 year old M with PMH of HTN, BCC s/p excision surgery on 12/5, CAD, Alzheimer's dementia and physical deconditioning presenting with increased confusion, fever and aggression, and admitted for acute metabolic encephalopathy in the setting of complicated UTI, and severe hypokalemia.  UA concerning for UTI.  Urine culture obtained, and he was started on IV ceftriaxone.  Urine culture growing pansensitive Enterococcus faecalis.  Antibiotics changed to IV ampicillin on 07/09/2021.  He is discharged on p.o. Amoxil 500 mg 3 times daily for 4 more days to complete treatment course.  Encephalopathy improved.  Patient has not had further agitation.  See individual problem list below for more on hospital course.  Discharge Diagnoses:  Complicated Enterococcus faecalis UTI-urine culture with pansensitive Enterococcus faecalis. -Discontinued ceftriaxone -IV ampicillin from 12/21-12/22.  Discharged on p.o. Amoxil for 4 more days.'s   Severe Hypokalemia: Resolved. -Discontinued HCTZ   Acute metabolic encephalopathy: Likely due to UTI.  Seems to be back to baseline. Dementia with behavioral disturbance: was aggressive at facility.  No further agitation or aggressive behavior during this hospitalization. -Treat UTI as above -Reorientation and delirium precautions   Hypertension: BP within acceptable range. -Continue Norvasc and lisinopril. -Discontinued HCTZ due to risk for dehydration and hypokalemia   Prolonged QT interval -Avoid medications which could  further prolong QT interval   History of BCC s/p recent excision surgery by ENT on 06/23/2021 -continue with eyedrops. -Keep dressing in place -Outpatient follow-up with ENT/surgery   Class I obesity Body mass index is 31.84 kg/m.           Discharge Exam: Vitals:   07/09/21 1634 07/09/21 2117 07/10/21 0515 07/10/21 0828  BP: 138/90 127/63 (!) 141/78 139/74  Pulse: 84 89 71 69  Temp: 98.9 F (37.2 C) 98.5 F (36.9 C) 98.4 F (36.9 C)   Resp: 18 18 18 18   Height:      Weight:      SpO2: 99% 98% 95% 97%  TempSrc:  Oral Oral   BMI (Calculated):         GENERAL: No apparent distress.  Nontoxic. HEENT: MMM.  Dressing over right face at the incision site. NECK: Supple.  No apparent JVD.  RESP: 97% on RA.  No IWOB.  Fair aeration bilaterally. CVS:  RRR. Heart sounds normal.  ABD/GI/GU: Bowel sounds present. Soft. Non tender.  MSK/EXT:  Moves extremities. No apparent deformity. No edema.  SKIN: no apparent skin lesion or wound NEURO: Awake and alert.  Oriented x4 except date but confused at times.  No apparent focal neuro deficit. PSYCH: Calm.  No distress or agitation.  Discharge Instructions  Discharge Instructions     Diet general   Complete by: As directed    Discharge wound care:   Complete by: As directed    Leave dressing intact to to right face until follow up with ENT; it is sutured in place   Increase activity slowly   Complete by: As directed       Allergies as of 07/10/2021       Reactions   Codeine Other (See Comments)   "Doesn't help with with pain  control"        Medication List     STOP taking these medications    lisinopril-hydrochlorothiazide 20-25 MG tablet Commonly known as: ZESTORETIC   potassium chloride 10 MEQ tablet Commonly known as: KLOR-CON       TAKE these medications    acetaminophen 650 MG CR tablet Commonly known as: TYLENOL Take 650 mg by mouth every 8 (eight) hours as needed for pain.   amLODipine 5 MG  tablet Commonly known as: NORVASC TAKE 1 TABLET BY MOUTH DAILY   amoxicillin 500 MG capsule Commonly known as: AMOXIL Take 1 capsule (500 mg total) by mouth 3 (three) times daily for 4 days.   AZO Cranberry Gummies 250 MG Chew Generic drug: Cranberry Chew 250 mg by mouth daily.   bisacodyl 10 MG suppository Commonly known as: DULCOLAX Place 10 mg rectally daily as needed for moderate constipation.   clopidogrel 75 MG tablet Commonly known as: PLAVIX Take 1 tablet (75 mg total) by mouth daily.   dutasteride 0.5 MG capsule Commonly known as: AVODART Take 0.5 mg by mouth daily.   Ensure Take 237 mLs by mouth 3 (three) times daily between meals.   erythromycin ophthalmic ointment Place 1 application into the right eye 4 (four) times daily. Started on 07-04-21    X 10 days   escitalopram 10 MG tablet Commonly known as: LEXAPRO Take 10 mg by mouth daily.   Flonase Sensimist 27.5 MCG/SPRAY nasal spray Generic drug: fluticasone Place 1 spray into the nose daily.   lactase 3000 units tablet Commonly known as: LACTAID Take 3,000-6,000 Units by mouth 3 (three) times daily with meals.   lisinopril 20 MG tablet Commonly known as: ZESTRIL Take 1 tablet (20 mg total) by mouth daily. Start taking on: July 11, 2021   magnesium hydroxide 400 MG/5ML suspension Commonly known as: MILK OF MAGNESIA Take 30 mLs by mouth daily as needed for mild constipation.   metoprolol tartrate 25 MG tablet Commonly known as: LOPRESSOR TAKE 1/2 TABLET BY MOUTH TWICE A DAY What changed: how much to take   nystatin ointment Commonly known as: MYCOSTATIN Apply 1 application topically 2 (two) times daily.   ondansetron 4 MG tablet Commonly known as: Zofran Take 1 tablet (4 mg total) by mouth every 8 (eight) hours as needed for nausea or vomiting.   polyethylene glycol 17 g packet Commonly known as: MiraLax Take 17 g by mouth daily.   pravastatin 20 MG tablet Commonly known as:  PRAVACHOL TAKE 1 TABLET (20 MG TOTAL) BY MOUTH DAILY. PLEASE SCHEDULE APPOINTMENT FOR FUTURE REFILLS. What changed: See the new instructions.   RA SALINE ENEMA RE Place 1 each rectally once. If no results with Biscodyl suppository   sennosides-docusate sodium 8.6-50 MG tablet Commonly known as: SENOKOT-S Take 2 tablets by mouth at bedtime.   tamsulosin 0.4 MG Caps capsule Commonly known as: FLOMAX Take 0.8 mg by mouth at bedtime.   tetrahydrozoline-zinc 0.05-0.25 % ophthalmic solution Commonly known as: VISINE-AC Place 1 drop into the right eye in the morning, at noon, in the evening, and at bedtime.   Vitamin D3 50 MCG (2000 UT) Tabs Take 2,000 Units by mouth in the morning.               Discharge Care Instructions  (From admission, onward)           Start     Ordered   07/10/21 0000  Discharge wound care:       Comments:  Leave dressing intact to to right face until follow up with ENT; it is sutured in place   07/10/21 1048            Consultations: None  Procedures/Studies:   CT Abdomen Pelvis Wo Contrast  Result Date: 06/19/2021 CLINICAL DATA:  Abdominal pain and distention.  Constipation. EXAM: CT ABDOMEN AND PELVIS WITHOUT CONTRAST TECHNIQUE: Multidetector CT imaging of the abdomen and pelvis was performed following the standard protocol without IV contrast. COMPARISON:  06/21/2019 FINDINGS: Lower chest: Emphysema. Motion degradation involving the lung bases. Heart size accentuated by a pectus excavatum deformity. Lad and right coronary artery calcification. Left ventricular apical infarct. Hepatobiliary: Normal liver. Cholecystectomy, without biliary ductal dilatation. Pancreas: Pancreatic atrophy. The apparent previously described low-density pancreatic body lesion including at 2.0 cm on 25/3 is decreased compared to the prior exam and may simply represent interdigitation of peripancreatic fat. Of doubtful clinical significance. No duct dilatation or  acute inflammation. Spleen: Normal in size, without focal abnormality. Adrenals/Urinary Tract: Mild left adrenal thickening. Normal right adrenal gland. Mild renal cortical thinning bilaterally. Calcified upper pole left renal lesion is suboptimally characterized but similar in size at 1.5 cm on 37/3. Mild bilateral caliectasis and proximal hydroureter. Moderate bladder distension. Stomach/Bowel: Normal stomach, without wall thickening. Scattered colonic diverticula. Normal terminal ileum and appendix. Normal small bowel. Vascular/Lymphatic: Ectasia of the celiac is similar at 1.5 cm. Aortic atherosclerosis. No abdominopelvic adenopathy. Reproductive: Moderate to marked prostatomegaly. Other: No significant free fluid. Suspect mild extraperitoneal pelvic edema bilaterally including on 69/3. Musculoskeletal: Grade 1 L5-S1 anterolisthesis. Lumbosacral spondylosis. IMPRESSION: 1. Moderate bladder distension in the setting of prostatomegaly. This is likely related to a component of outlet obstruction. Bilateral caliectasis and proximal hydroureter, likely secondary. Subtle pelvic edema is favored to be related to the distended bladder. 2. Emphysema (ICD10-J43.9). Coronary artery atherosclerosis. Aortic Atherosclerosis (ICD10-I70.0). Celiac origin ectasia is not significantly changed. Electronically Signed   By: Abigail Miyamoto M.D.   On: 06/19/2021 17:49   CT ABDOMEN PELVIS W CONTRAST  Result Date: 07/01/2021 CLINICAL DATA:  Abdominal pain EXAM: CT ABDOMEN AND PELVIS WITH CONTRAST TECHNIQUE: Multidetector CT imaging of the abdomen and pelvis was performed using the standard protocol following bolus administration of intravenous contrast. CONTRAST:  30mL OMNIPAQUE IOHEXOL 300 MG/ML  SOLN COMPARISON:  CT abdomen and pelvis dated June 19, 2021 FINDINGS: Lower chest: Scarring of the left ventricular apex, likely due to prior LAD infarct. Calcifications of the LAD and RCA. Bibasilar atelectasis. Hepatobiliary: No  suspicious liver lesions. Cholecystectomy clips. Mild biliary ductal dilation, common bile duct measures up to 8 mm, not unexpected status post cholecystectomy. Pancreas: Unchanged cystic lesion at the junction of the body and tail of the pancreas measuring up to 2.0 cm. Spleen: Peripherally enhancing lesions of the posterior spleen, unchanged compared to priors and likely splenic hemangiomas. Adrenals/Urinary Tract: Bilateral adrenal glands are unremarkable. Kidneys enhance symmetrically with no evidence of hydronephrosis. Unchanged calcified lesion of the lower pole of the left kidney. Urinary bladder is thick-walled and decompressed and contains a Foley catheter. Stomach/Bowel: Stomach is within normal limits. Appendix is not visualized. No evidence of bowel wall thickening, distention, or inflammatory changes. Vascular/Lymphatic: Aortic atherosclerosis. Unchanged ectasia of the celiac artery. No enlarged abdominal or pelvic lymph nodes. Reproductive: Marked prostatomegaly, measuring up to 7.8 cm. Other: No abdominal wall hernia or abnormality. No abdominopelvic ascites. Musculoskeletal: Grade 1 anterolisthesis of L5 on S1, unchanged compared to prior exam. No acute or significant osseous findings. IMPRESSION: 1. No acute  findings in the abdomen or pelvis. 2. Aortic Atherosclerosis (ICD10-I70.0) and Emphysema (ICD10-J43.9). Unchanged ectasia of the celiac artery. Electronically Signed   By: Yetta Glassman M.D.   On: 07/01/2021 09:37   DG Chest Port 1 View  Result Date: 07/08/2021 CLINICAL DATA:  Sepsis, fever EXAM: PORTABLE CHEST 1 VIEW COMPARISON:  08/07/2020 FINDINGS: The heart size and mediastinal contours are within normal limits. Both lungs are clear. The visualized skeletal structures are unremarkable. IMPRESSION: No active disease. Electronically Signed   By: Fidela Salisbury M.D.   On: 07/08/2021 03:04   DG Abdomen Acute W/Chest  Result Date: 06/29/2021 CLINICAL DATA:  Abdominal discomfort.  EXAM: DG ABDOMEN ACUTE WITH 1 VIEW CHEST COMPARISON:  06/19/2021. FINDINGS: There is no evidence of dilated bowel loops or free intraperitoneal air. A vague calcified lesion is again noted in the upper pole the left kidney, unchanged from the prior exam. Surgical clips are noted in the right upper quadrant. Heart size and mediastinal contours are within normal limits. Atherosclerotic calcification of the aorta is noted. Both lungs are clear. There are degenerative changes in the thoracolumbar spine. IMPRESSION: 1. No acute cardiopulmonary process. 2. No evidence of bowel obstruction or free air. 3. Vague calcified lesion in the upper pole the left kidney, unchanged from the previous exam. . Electronically Signed   By: Brett Fairy M.D.   On: 06/29/2021 02:04       The results of significant diagnostics from this hospitalization (including imaging, microbiology, ancillary and laboratory) are listed below for reference.     Microbiology: Recent Results (from the past 240 hour(s))  Blood Culture (routine x 2)     Status: None (Preliminary result)   Collection Time: 07/08/21  1:59 AM   Specimen: BLOOD  Result Value Ref Range Status   Specimen Description BLOOD RIGHT HAND  Final   Special Requests   Final    BOTTLES DRAWN AEROBIC AND ANAEROBIC Blood Culture adequate volume   Culture   Final    NO GROWTH 2 DAYS Performed at [facility], [address] [place], [place] [address]    Report Status PENDING  Incomplete  Urine Culture     Status: Abnormal   Collection Time: 07/08/21  1:59 AM   Specimen: In/Out Cath Urine  Result Value Ref Range Status   Specimen Description IN/OUT CATH URINE  Final   Special Requests   Final    NONE Performed at [facility], [place] [address] [place], [place] [address]    Culture 70,000 COLONIES/mL ENTEROCOCCUS FAECALIS (A)  Final   Report Status 07/09/2021 FINAL  Final   Organism ID, Bacteria ENTEROCOCCUS FAECALIS (A)  Final      Susceptibility    Enterococcus faecalis - MIC*    AMPICILLIN <=2 SENSITIVE Sensitive     NITROFURANTOIN <=16 SENSITIVE Sensitive     VANCOMYCIN 1 SENSITIVE Sensitive     * 70,000 COLONIES/mL ENTEROCOCCUS FAECALIS  Blood Culture (routine x 2)     Status: None (Preliminary result)   Collection Time: 07/08/21  2:05 AM   Specimen: BLOOD  Result Value Ref Range Status   Specimen Description BLOOD LEFT HAND  Final   Special Requests   Final    BOTTLES DRAWN AEROBIC AND ANAEROBIC Blood Culture adequate volume   Culture   Final    NO GROWTH 2 DAYS Performed at [facility], [address], [place], [place] [address]    Report Status PENDING  Incomplete  Resp Panel by RT-PCR (Flu A&B, Covid) Nasopharyngeal Swab     Status: None   Collection Time: 07/08/21  2:15 AM   Specimen: Nasopharyngeal Swab; Nasopharyngeal(NP) swabs in vial transport medium  Result Value Ref Range Status   SARS Coronavirus 2 by RT PCR NEGATIVE NEGATIVE Final    Comment: (NOTE) SARS-CoV-2 target nucleic acids are NOT DETECTED.  The SARS-CoV-2 RNA is generally detectable in upper respiratory specimens during the acute phase of infection. The lowest concentration of SARS-CoV-2 viral copies this assay can detect is 138 copies/mL. A negative result does not preclude SARS-Cov-2 infection and should not be used as the sole basis for treatment or other patient management decisions. A negative result may occur with  improper specimen collection/handling, submission of specimen other than nasopharyngeal swab, presence of viral mutation(s) within the areas targeted by this assay, and inadequate number of viral copies(<138 copies/mL). A negative result must be combined with clinical observations, patient history, and epidemiological information. The expected result is Negative.  Fact Sheet for Patients:  EntrepreneurPulse.com.au  Fact Sheet for Healthcare Providers:  IncredibleEmployment.be  This  test is no t yet approved or cleared by the [place] FDA and  has been authorized for detection and/or diagnosis of SARS-CoV-2 by FDA under an Emergency Use Authorization (EUA). This EUA will remain  in effect (meaning this test can be used) for the duration of the COVID-19 declaration under Section 564(b)(1) of the Act, 21 U.S.C.section 360bbb-3(b)(1), unless the authorization is terminated  or revoked sooner.       Influenza A by PCR NEGATIVE NEGATIVE Final   Influenza B by PCR NEGATIVE NEGATIVE Final    Comment: (NOTE) The Xpert Xpress SARS-CoV-2/FLU/RSV plus assay is intended as an aid in the diagnosis of influenza from Nasopharyngeal swab specimens and should not be used as a sole basis for treatment. Nasal washings and aspirates are unacceptable for Xpert Xpress SARS-CoV-2/FLU/RSV testing.  Fact Sheet for Patients: EntrepreneurPulse.com.au  Fact Sheet for Healthcare Providers: IncredibleEmployment.be  This test is not yet approved or cleared by the [place] FDA and has been authorized for detection and/or diagnosis of SARS-CoV-2 by FDA under an Emergency Use Authorization (EUA). This EUA will remain in effect (meaning this test can be used) for the duration of the COVID-19 declaration under Section 564(b)(1) of the Act, 21 U.S.C. section 360bbb-3(b)(1), unless the authorization is terminated or revoked.  Performed at [facility], [place] [address] [place], [place] [address]      Labs:  CBC: Recent Labs  Lab 07/08/21 0200 07/09/21 0233  WBC 7.2 5.3  NEUTROABS 5.6  --   HGB 15.0 13.3  HCT 43.1 38.9*  MCV 88.3 88.4  PLT 205 190   BMP &GFR Recent Labs  Lab 07/08/21 0200 07/08/21 0401 07/08/21 1440 07/09/21 0233 07/10/21 0940  NA 136  --   --  135 135  K 2.6*  --  4.0 3.1* 3.4*  CL 95*  --   --  99 101  CO2 28  --   --  27 28  GLUCOSE 141*  --   --  98 117*  BUN 17  --   --  6* 5*   CREATININE 0.96  --   --  0.79 0.83  CALCIUM 9.0  --   --  8.5* 8.5*  MG  --  1.7  --  1.7 1.7  PHOS  --   --   --   --  3.0   Estimated  Creatinine Clearance: 75.4 mL/min (by C-G formula based on SCr of 0.83 mg/dL). Liver & Pancreas: Recent Labs  Lab 07/08/21 0200 07/10/21 0940  AST 22  --   ALT 24  --   ALKPHOS 75  --   BILITOT 2.2*  --   PROT 6.4*  --   ALBUMIN 3.4* 2.9*   No results for input(s): LIPASE, AMYLASE in the last 168 hours. No results for input(s): AMMONIA in the last 168 hours. Diabetic: No results for input(s): HGBA1C in the last 72 hours. No results for input(s): GLUCAP in the last 168 hours. Cardiac Enzymes: No results for input(s): CKTOTAL, CKMB, CKMBINDEX, TROPONINI in the last 168 hours. No results for input(s): PROBNP in the last 8760 hours. Coagulation Profile: Recent Labs  Lab 07/08/21 0200  INR 1.1   Thyroid Function Tests: No results for input(s): TSH, T4TOTAL, FREET4, T3FREE, THYROIDAB in the last 72 hours. Lipid Profile: No results for input(s): CHOL, HDL, LDLCALC, TRIG, CHOLHDL, LDLDIRECT in the last 72 hours. Anemia Panel: No results for input(s): VITAMINB12, FOLATE, FERRITIN, TIBC, IRON, RETICCTPCT in the last 72 hours. Urine analysis:    Component Value Date/Time   COLORURINE YELLOW 07/08/2021 0159   APPEARANCEUR CLOUDY (A) 07/08/2021 0159   LABSPEC 1.025 07/08/2021 0159   PHURINE 5.5 07/08/2021 0159   GLUCOSEU NEGATIVE 07/08/2021 0159   HGBUR MODERATE (A) 07/08/2021 0159   BILIRUBINUR MODERATE (A) 07/08/2021 0159   KETONESUR 15 (A) 07/08/2021 0159   PROTEINUR 30 (A) 07/08/2021 0159   NITRITE NEGATIVE 07/08/2021 0159   LEUKOCYTESUR MODERATE (A) 07/08/2021 0159   Sepsis Labs: Invalid input(s): PROCALCITONIN, LACTICIDVEN   Time coordinating discharge: 50 minutes  SIGNED:  Mercy Riding, MD  [facility] 07/10/2021, 10:48 AM

## 2021-07-13 LAB — CULTURE, BLOOD (ROUTINE X 2)
Culture: NO GROWTH
Culture: NO GROWTH
Special Requests: ADEQUATE
Special Requests: ADEQUATE

## 2021-07-17 ENCOUNTER — Encounter: Payer: Medicare (Managed Care) | Admitting: Plastic Surgery

## 2021-08-01 ENCOUNTER — Ambulatory Visit: Payer: Medicare (Managed Care) | Admitting: Interventional Cardiology

## 2021-08-02 ENCOUNTER — Inpatient Hospital Stay (HOSPITAL_COMMUNITY)
Admission: EM | Admit: 2021-08-02 | Discharge: 2021-08-05 | DRG: 698 | Disposition: A | Discharge status: Skilled Nursing Facility | Payer: Medicare (Managed Care) | Attending: Family Medicine | Admitting: Family Medicine

## 2021-08-02 ENCOUNTER — Emergency Department (HOSPITAL_COMMUNITY): Payer: Medicare (Managed Care)

## 2021-08-02 ENCOUNTER — Other Ambulatory Visit: Payer: Self-pay

## 2021-08-02 ENCOUNTER — Encounter (HOSPITAL_COMMUNITY): Payer: Self-pay

## 2021-08-02 DIAGNOSIS — Z8249 Family history of ischemic heart disease and other diseases of the circulatory system: Secondary | ICD-10-CM | POA: Diagnosis not present

## 2021-08-02 DIAGNOSIS — Z800 Family history of malignant neoplasm of digestive organs: Secondary | ICD-10-CM | POA: Diagnosis not present

## 2021-08-02 DIAGNOSIS — R410 Disorientation, unspecified: Secondary | ICD-10-CM

## 2021-08-02 DIAGNOSIS — Z6825 Body mass index (BMI) 25.0-25.9, adult: Secondary | ICD-10-CM

## 2021-08-02 DIAGNOSIS — Z781 Physical restraint status: Secondary | ICD-10-CM

## 2021-08-02 DIAGNOSIS — R4182 Altered mental status, unspecified: Secondary | ICD-10-CM | POA: Diagnosis present

## 2021-08-02 DIAGNOSIS — M109 Gout, unspecified: Secondary | ICD-10-CM | POA: Diagnosis present

## 2021-08-02 DIAGNOSIS — I10 Essential (primary) hypertension: Secondary | ICD-10-CM | POA: Diagnosis present

## 2021-08-02 DIAGNOSIS — Z85828 Personal history of other malignant neoplasm of skin: Secondary | ICD-10-CM

## 2021-08-02 DIAGNOSIS — Y846 Urinary catheterization as the cause of abnormal reaction of the patient, or of later complication, without mention of misadventure at the time of the procedure: Secondary | ICD-10-CM | POA: Diagnosis present

## 2021-08-02 DIAGNOSIS — E876 Hypokalemia: Secondary | ICD-10-CM | POA: Diagnosis present

## 2021-08-02 DIAGNOSIS — T83511A Infection and inflammatory reaction due to indwelling urethral catheter, initial encounter: Secondary | ICD-10-CM | POA: Diagnosis present

## 2021-08-02 DIAGNOSIS — E860 Dehydration: Secondary | ICD-10-CM | POA: Diagnosis present

## 2021-08-02 DIAGNOSIS — Z85038 Personal history of other malignant neoplasm of large intestine: Secondary | ICD-10-CM | POA: Diagnosis not present

## 2021-08-02 DIAGNOSIS — N400 Enlarged prostate without lower urinary tract symptoms: Secondary | ICD-10-CM | POA: Diagnosis present

## 2021-08-02 DIAGNOSIS — J189 Pneumonia, unspecified organism: Secondary | ICD-10-CM

## 2021-08-02 DIAGNOSIS — I252 Old myocardial infarction: Secondary | ICD-10-CM

## 2021-08-02 DIAGNOSIS — G9341 Metabolic encephalopathy: Secondary | ICD-10-CM | POA: Diagnosis present

## 2021-08-02 DIAGNOSIS — Z7902 Long term (current) use of antithrombotics/antiplatelets: Secondary | ICD-10-CM | POA: Diagnosis not present

## 2021-08-02 DIAGNOSIS — Z20822 Contact with and (suspected) exposure to covid-19: Secondary | ICD-10-CM | POA: Diagnosis present

## 2021-08-02 DIAGNOSIS — H1031 Unspecified acute conjunctivitis, right eye: Secondary | ICD-10-CM | POA: Diagnosis present

## 2021-08-02 DIAGNOSIS — N390 Urinary tract infection, site not specified: Secondary | ICD-10-CM | POA: Diagnosis present

## 2021-08-02 DIAGNOSIS — I2510 Atherosclerotic heart disease of native coronary artery without angina pectoris: Secondary | ICD-10-CM | POA: Diagnosis present

## 2021-08-02 DIAGNOSIS — Z79899 Other long term (current) drug therapy: Secondary | ICD-10-CM

## 2021-08-02 DIAGNOSIS — F0390 Unspecified dementia without behavioral disturbance: Secondary | ICD-10-CM | POA: Diagnosis present

## 2021-08-02 DIAGNOSIS — Z87440 Personal history of urinary (tract) infections: Secondary | ICD-10-CM | POA: Diagnosis not present

## 2021-08-02 DIAGNOSIS — R627 Adult failure to thrive: Secondary | ICD-10-CM | POA: Diagnosis present

## 2021-08-02 DIAGNOSIS — Z885 Allergy status to narcotic agent status: Secondary | ICD-10-CM | POA: Diagnosis not present

## 2021-08-02 LAB — COMPREHENSIVE METABOLIC PANEL
ALT: 34 U/L (ref 0–44)
AST: 34 U/L (ref 15–41)
Albumin: 3.2 g/dL — ABNORMAL LOW (ref 3.5–5.0)
Alkaline Phosphatase: 81 U/L (ref 38–126)
Anion gap: 18 — ABNORMAL HIGH (ref 5–15)
BUN: 16 mg/dL (ref 8–23)
CO2: 26 mmol/L (ref 22–32)
Calcium: 8.8 mg/dL — ABNORMAL LOW (ref 8.9–10.3)
Chloride: 95 mmol/L — ABNORMAL LOW (ref 98–111)
Creatinine, Ser: 1.05 mg/dL (ref 0.61–1.24)
GFR, Estimated: >60 mL/min (ref >60)
Glucose, Bld: 112 mg/dL — ABNORMAL HIGH (ref 70–99)
Potassium: 2.9 mmol/L — ABNORMAL LOW (ref 3.5–5.1)
Sodium: 139 mmol/L (ref 135–145)
Total Bilirubin: 1.4 mg/dL — ABNORMAL HIGH (ref 0.3–1.2)
Total Protein: 6.8 g/dL (ref 6.5–8.1)

## 2021-08-02 LAB — CBC WITH DIFFERENTIAL/PLATELET
Abs Immature Granulocytes: 0.11 10*3/uL — ABNORMAL HIGH (ref 0.00–0.07)
Basophils Absolute: 0 10*3/uL (ref 0.0–0.1)
Basophils Relative: 0 %
Eosinophils Absolute: 0.1 10*3/uL (ref 0.0–0.5)
Eosinophils Relative: 1 %
HCT: 39.7 % (ref 39.0–52.0)
Hemoglobin: 13.4 g/dL (ref 13.0–17.0)
Immature Granulocytes: 1 %
Lymphocytes Relative: 8 %
Lymphs Abs: 0.7 10*3/uL (ref 0.7–4.0)
MCH: 30.8 pg (ref 26.0–34.0)
MCHC: 33.8 g/dL (ref 30.0–36.0)
MCV: 91.3 fL (ref 80.0–100.0)
Monocytes Absolute: 0.6 10*3/uL (ref 0.1–1.0)
Monocytes Relative: 6 %
Neutro Abs: 7.8 10*3/uL — ABNORMAL HIGH (ref 1.7–7.7)
Neutrophils Relative %: 84 %
Platelets: 199 10*3/uL (ref 150–400)
RBC: 4.35 MIL/uL (ref 4.22–5.81)
RDW: 13.2 % (ref 11.5–15.5)
WBC: 9.3 10*3/uL (ref 4.0–10.5)
nRBC: 0 % (ref 0.0–0.2)

## 2021-08-02 LAB — LACTIC ACID, PLASMA: Lactic Acid, Venous: 4.2 mmol/L (ref 0.5–1.9)

## 2021-08-02 LAB — URINALYSIS, MICROSCOPIC (REFLEX)
RBC / HPF: >50 RBC/hpf (ref 0–5)
WBC, UA: >50 WBC/hpf (ref 0–5)

## 2021-08-02 LAB — URINALYSIS, ROUTINE W REFLEX MICROSCOPIC
Glucose, UA: NEGATIVE mg/dL
Ketones, ur: >80 mg/dL — AB
Nitrite: NEGATIVE
Protein, ur: 100 mg/dL — AB
Specific Gravity, Urine: 1.02 (ref 1.005–1.030)
pH: 7.5 (ref 5.0–8.0)

## 2021-08-02 LAB — MAGNESIUM: Magnesium: 1.6 mg/dL — ABNORMAL LOW (ref 1.7–2.4)

## 2021-08-02 LAB — RESP PANEL BY RT-PCR (FLU A&B, COVID) ARPGX2
Influenza A by PCR: NEGATIVE
Influenza B by PCR: NEGATIVE
SARS Coronavirus 2 by RT PCR: NEGATIVE

## 2021-08-02 LAB — CBG MONITORING, ED: Glucose-Capillary: 100 mg/dL — ABNORMAL HIGH (ref 70–99)

## 2021-08-02 MED ORDER — LORAZEPAM 2 MG/ML IJ SOLN
1.0000 mg | Freq: Once | INTRAMUSCULAR | Status: AC
  Administered 2021-08-02: 1 mg via INTRAVENOUS
  Filled 2021-08-02: qty 1

## 2021-08-02 MED ORDER — HYDRALAZINE HCL 25 MG PO TABS: 25.0000 mg | ORAL_TABLET | Freq: Four times a day (QID) | ORAL | Status: DC | PRN

## 2021-08-02 MED ORDER — ENOXAPARIN SODIUM 40 MG/0.4ML IJ SOSY
40.0000 mg | PREFILLED_SYRINGE | INTRAMUSCULAR | Status: DC
  Administered 2021-08-02 – 2021-08-04 (×2): 40 mg via SUBCUTANEOUS
  Filled 2021-08-02 (×2): qty 0.4

## 2021-08-02 MED ORDER — TAMSULOSIN HCL 0.4 MG PO CAPS
0.8000 mg | ORAL_CAPSULE | Freq: Every day | ORAL | Status: DC
  Administered 2021-08-04: 0.8 mg via ORAL
  Filled 2021-08-02: qty 2

## 2021-08-02 MED ORDER — SODIUM CHLORIDE 0.9 % IV SOLN
1.0000 g | Freq: Once | INTRAVENOUS | Status: AC
  Administered 2021-08-02: 1 g via INTRAVENOUS
  Filled 2021-08-02: qty 10

## 2021-08-02 MED ORDER — MAGNESIUM SULFATE 2 GM/50ML IV SOLN
2.0000 g | Freq: Once | INTRAVENOUS | Status: AC
  Administered 2021-08-02: 2 g via INTRAVENOUS
  Filled 2021-08-02: qty 50

## 2021-08-02 MED ORDER — HYDROMORPHONE HCL 1 MG/ML IJ SOLN
0.5000 mg | INTRAMUSCULAR | Status: DC | PRN
  Administered 2021-08-03: 1 mg via INTRAVENOUS
  Filled 2021-08-02: qty 1

## 2021-08-02 MED ORDER — DUTASTERIDE 0.5 MG PO CAPS
0.5000 mg | ORAL_CAPSULE | Freq: Every day | ORAL | Status: DC
  Administered 2021-08-03 – 2021-08-05 (×3): 0.5 mg via ORAL
  Filled 2021-08-02 (×3): qty 1

## 2021-08-02 MED ORDER — FLUTICASONE FUROATE 27.5 MCG/SPRAY NA SUSP: 1.0000 | Freq: Every day | NASAL | Status: DC

## 2021-08-02 MED ORDER — POTASSIUM CHLORIDE 10 MEQ/100ML IV SOLN
10.0000 meq | Freq: Once | INTRAVENOUS | Status: AC
Start: 2021-08-02 — End: 2021-08-02
  Administered 2021-08-02: 10 meq via INTRAVENOUS
  Filled 2021-08-02: qty 100

## 2021-08-02 MED ORDER — NAPHAZOLINE-GLYCERIN 0.012-0.25 % OP SOLN
1.0000 [drp] | Freq: Four times a day (QID) | OPHTHALMIC | Status: DC | PRN
  Filled 2021-08-02: qty 15

## 2021-08-02 MED ORDER — FLUTICASONE PROPIONATE 50 MCG/ACT NA SUSP
1.0000 | Freq: Every day | NASAL | Status: DC
  Administered 2021-08-03 – 2021-08-04 (×2): 1 via NASAL
  Filled 2021-08-02 (×2): qty 16

## 2021-08-02 MED ORDER — HYDRALAZINE HCL 20 MG/ML IJ SOLN
5.0000 mg | Freq: Four times a day (QID) | INTRAMUSCULAR | Status: DC | PRN
  Administered 2021-08-05: 5 mg via INTRAVENOUS
  Filled 2021-08-02: qty 1

## 2021-08-02 MED ORDER — CRANBERRY 250 MG PO CHEW: 250.0000 mg | CHEWABLE_TABLET | Freq: Every day | ORAL | Status: DC

## 2021-08-02 MED ORDER — SODIUM CHLORIDE 0.9 % IV SOLN
1.0000 g | Freq: Four times a day (QID) | INTRAVENOUS | Status: DC
  Administered 2021-08-02 – 2021-08-05 (×11): 1 g via INTRAVENOUS
  Filled 2021-08-02 (×13): qty 1000

## 2021-08-02 MED ORDER — POTASSIUM CHLORIDE 10 MEQ/100ML IV SOLN
INTRAVENOUS | Status: AC
  Filled 2021-08-02: qty 100

## 2021-08-02 MED ORDER — ACETAMINOPHEN 650 MG RE SUPP
650.0000 mg | Freq: Four times a day (QID) | RECTAL | Status: DC | PRN
  Administered 2021-08-03: 650 mg via RECTAL
  Filled 2021-08-02: qty 1

## 2021-08-02 MED ORDER — POTASSIUM CHLORIDE CRYS ER 20 MEQ PO TBCR
40.0000 meq | EXTENDED_RELEASE_TABLET | Freq: Once | ORAL | Status: AC
  Administered 2021-08-02: 40 meq via ORAL
  Filled 2021-08-02: qty 2

## 2021-08-02 MED ORDER — HALOPERIDOL LACTATE 5 MG/ML IJ SOLN
2.0000 mg | Freq: Four times a day (QID) | INTRAMUSCULAR | Status: DC | PRN
  Administered 2021-08-02 – 2021-08-05 (×5): 2 mg via INTRAVENOUS
  Filled 2021-08-02 (×5): qty 1

## 2021-08-02 MED ORDER — SODIUM CHLORIDE 0.9 % IV BOLUS
1000.0000 mL | Freq: Once | INTRAVENOUS | Status: AC
  Administered 2021-08-02: 1000 mL via INTRAVENOUS

## 2021-08-02 MED ORDER — ERYTHROMYCIN 5 MG/GM OP OINT
1.0000 "application " | TOPICAL_OINTMENT | Freq: Once | OPHTHALMIC | Status: AC
  Administered 2021-08-02: 1 via OPHTHALMIC
  Filled 2021-08-02: qty 3.5

## 2021-08-02 MED ORDER — ONDANSETRON HCL 4 MG PO TABS
4.0000 mg | ORAL_TABLET | Freq: Four times a day (QID) | ORAL | Status: DC | PRN
  Filled 2021-08-02: qty 1

## 2021-08-02 MED ORDER — SODIUM CHLORIDE 0.9 % IV SOLN: INTRAVENOUS | Status: AC

## 2021-08-02 MED ORDER — ACETAMINOPHEN 325 MG PO TABS: 650.0000 mg | ORAL_TABLET | Freq: Four times a day (QID) | ORAL | Status: DC | PRN

## 2021-08-02 MED ORDER — ACETAMINOPHEN 500 MG PO TABS
1000.0000 mg | ORAL_TABLET | Freq: Once | ORAL | Status: AC
  Administered 2021-08-02: 1000 mg via ORAL
  Filled 2021-08-02: qty 2

## 2021-08-02 MED ORDER — ONDANSETRON HCL 4 MG/2ML IJ SOLN: 4.0000 mg | Freq: Four times a day (QID) | INTRAMUSCULAR | Status: DC | PRN

## 2021-08-02 MED ORDER — ERYTHROMYCIN 5 MG/GM OP OINT: 1.0000 "application " | TOPICAL_OINTMENT | Freq: Four times a day (QID) | OPHTHALMIC | Status: DC

## 2021-08-02 NOTE — H&P (Signed)
History and Physical    Jeffrey Zimmerman:[id number] DOB: 03-19-1936 DOA: 08/02/2021  PCP: [name], MD (Confirm with patient/family/NH records and if not entered, this has to be entered at [facility] point of entry) Patient coming from: Home  I have personally briefly reviewed patient's old medical records in [place]  Chief Complaint: Leave me alone  HPI: Jeffrey Zimmerman is a 86 y.o. male with medical history significant of advanced dementia, BPH, indwelling Foley catheter, HTN, recent oral BCC status post excision, CAD, was brought in for fever and mentation changes.  Patient very agitated unable to provide any history, I tried to reach patient's 2 contacts his wife and son, neither pick up the phone.  Could not leave message either.  All history collected by reviewing ED chart and talking to ED staff.  Patient was hospitalized about 3 weeks ago for episodes of Enterococcus fasciitis UTI and was discharged.  Today, family brought patient in reporting that " they can no longer taking her patient".  Appeared that outpatient family also mentioned patient mentation deteriorated and become more confused and agitated at home.  And patient was found low-grade fever in the ED 100.9.  According to ER chart, patient was noticed to have borderline low O2 saturation of 90% on room air when EMS picked him up and was placed on oxygen.  ED Course: Patient very agitated, low-grade fever 100.9, no hypotension, Foley catheter exchanged in ED.  UA showed possible UTI.  Chest x-ray no acute infiltrate.  EKG showed no QTC prolongation.  Patient was given IV fluids, ceftriaxone, 1 dose of Ativan and placed on soft restraints.  Review of Systems: Unable to perform, patient agitated.   Past Medical History:  Diagnosis Date   Adenomatous colon polyp 08/2008   Arthritis    Basal cell carcinoma    Cancer (HCC)    colon   Coronary artery disease    04/21/10 Anter MI with BMS to LAD with residual  proximal LAD EF 40%   Gout    History of cholecystectomy    History of colonoscopy    1/5 AND 2/10   Hypertension    Irritable bowel syndrome    Myocardial infarct [facility])    SVT (supraventricular tachycardia) ([place])     Past Surgical History:  Procedure Laterality Date   ADJACENT TISSUE TRANSFER/TISSUE REARRANGEMENT Right 06/23/2021   Procedure: ADJACENT TISSUE TRANSFER/TISSUE REARRANGEMENT;  Surgeon: Cindra Presume, MD;  Location: [place];  Service: Plastics;  Laterality: Right;   APPENDECTOMY     CARDIAC CATHETERIZATION     04/21/10 Anter MI with BMS to LAD with residual proximal LAD EF 40%   CHOLECYSTECTOMY     pt does not remember when   [place] Right 06/23/2021   Procedure: Right cheek reconstruction;  Surgeon: Cindra Presume, MD;  Location: [place];  Service: Plastics;  Laterality: Right;   KNEE ARTHOSCOPY Bilateral    PAROTIDECTOMY N/A 06/23/2021   Procedure: PAROTIDECTOMY  RIGHT SUPERFICIAL WITH FACIAL NERVE DISSECTION;  Surgeon: Izora Gala, MD;  Location: [place];  Service: ENT;  Laterality: N/A;   PARTIAL LEFT NEPHRECTOMY     (for oncocytoma-benign)     reports that he has never smoked. He has never used smokeless tobacco. He reports that he does not drink alcohol and does not use drugs.  Allergies  Allergen Reactions   Codeine Other (See Comments)    "Doesn't help with with pain control"    Family  History  Problem Relation Age of Onset   Heart attack Mother    CAD Mother    Colon cancer Father    Rheum arthritis Maternal Grandfather      Prior to Admission medications   Medication Sig Start Date End Date Taking? Authorizing Provider  acetaminophen (TYLENOL) 650 MG CR tablet Take 650 mg by mouth every 8 (eight) hours as needed for pain.    [provider]  amLODipine (NORVASC) 5 MG tablet TAKE 1 TABLET BY MOUTH DAILY Patient not taking: Reported on 07/08/2021 03/28/19   [name], MD  bisacodyl (DULCOLAX) 10 MG suppository  Place 10 mg rectally daily as needed for moderate constipation.    [provider]  Cholecalciferol (VITAMIN D3) 50 MCG (2000 UT) TABS Take 2,000 Units by mouth in the morning.    [provider]  clopidogrel (PLAVIX) 75 MG tablet Take 1 tablet (75 mg total) by mouth daily. Patient not taking: Reported on 07/08/2021 07/05/20   [name], MD  Cranberry (AZO CRANBERRY GUMMIES) 250 MG CHEW Chew 250 mg by mouth daily.    [provider]  dutasteride (AVODART) 0.5 MG capsule Take 0.5 mg by mouth daily.    [provider]  Ensure (ENSURE) Take 237 mLs by mouth 3 (three) times daily between meals.    [provider]  erythromycin ophthalmic ointment Place 1 application into the right eye 4 (four) times daily. Started on 07-04-21    X 10 days    [provider]  escitalopram (LEXAPRO) 10 MG tablet Take 10 mg by mouth daily.    [provider]  fluticasone (FLONASE SENSIMIST) 27.5 MCG/SPRAY nasal spray Place 1 spray into the nose daily.    [provider]  lactase (LACTAID) 3000 units tablet Take 3,000-6,000 Units by mouth 3 (three) times daily with meals.    [provider]  lisinopril (ZESTRIL) 20 MG tablet Take 1 tablet (20 mg total) by mouth daily. 07/11/21   Mercy Riding, MD  magnesium hydroxide (MILK OF MAGNESIA) 400 MG/5ML suspension Take 30 mLs by mouth daily as needed for mild constipation.    [provider]  metoprolol tartrate (LOPRESSOR) 25 MG tablet TAKE 1/2 TABLET BY MOUTH TWICE A DAY Patient taking differently: Take 25 mg by mouth 2 (two) times daily. 10/09/20   Belva Crome, MD  nystatin ointment (MYCOSTATIN) Apply 1 application topically 2 (two) times daily. 06/29/21   Fatima Blank, MD  ondansetron (ZOFRAN) 4 MG tablet Take 1 tablet (4 mg total) by mouth every 8 (eight) hours as needed for nausea or vomiting. 06/23/21   Scheeler, Carola Rhine, PA-C  pravastatin (PRAVACHOL) 20 MG  tablet TAKE 1 TABLET (20 MG TOTAL) BY MOUTH DAILY. PLEASE SCHEDULE APPOINTMENT FOR FUTURE REFILLS. Patient taking differently: Take 20 mg by mouth daily. 08/07/20   [name], MD  sennosides-docusate sodium (SENOKOT-S) 8.6-50 MG tablet Take 2 tablets by mouth at bedtime.    [provider]  Sodium Phosphates (RA SALINE ENEMA RE) Place 1 each rectally once. If no results with Biscodyl suppository    [provider]  tamsulosin (FLOMAX) 0.4 MG CAPS capsule Take 0.8 mg by mouth at bedtime. 11/19/19   [provider]  tetrahydrozoline-zinc (VISINE-AC) 0.05-0.25 % ophthalmic solution Place 1 drop into the right eye in the morning, at noon, in the evening, and at bedtime.    [provider]    Physical Exam: Vitals:   08/02/21 1307  08/02/21 1415 08/02/21 1500 08/02/21 1530  BP: 128/63 (!) 123/58 (!) 165/65 133/61  Pulse: 82 63 76 69  Resp: (!) 25 19 (!) 26 (!) 25  Temp: 100.1 F (37.8 C)  99.8 F (37.7 C)   TempSrc: Oral  Oral   SpO2: 93% 93%  100%    Constitutional: NAD, calm, comfortable Vitals:   08/02/21 1307 08/02/21 1415 08/02/21 1500 08/02/21 1530  BP: 128/63 (!) 123/58 (!) 165/65 133/61  Pulse: 82 63 76 69  Resp: (!) 25 19 (!) 26 (!) 25  Temp: 100.1 F (37.8 C)  99.8 F (37.7 C)   TempSrc: Oral  Oral   SpO2: 93% 93%  100%   Eyes: PERRL, lids and conjunctivae normal ENMT: Mucous membranes are dry. Posterior pharynx clear of any exudate or lesions.Normal dentition.  Neck: normal, supple, no masses, no thyromegaly Respiratory: clear to auscultation bilaterally, no wheezing, no crackles. Normal respiratory effort. No accessory muscle use.  Cardiovascular: Regular rate and rhythm, no murmurs / rubs / gallops. No extremity edema. 2+ pedal pulses. No carotid bruits.  Abdomen: no tenderness, no masses palpated. No hepatosplenomegaly. Bowel sounds positive.  Musculoskeletal: no clubbing / cyanosis. No joint deformity upper and lower  extremities. Good ROM, no contractures. Normal muscle tone.  Skin: no rashes, lesions, ulcers. No induration Neurologic: Moving all limits, agitated not following command. Psychiatric: Agitated    Labs on Admission: I have personally reviewed following labs and imaging studies  CBC: Recent Labs  Lab 08/02/21 1022  WBC 9.3  NEUTROABS 7.8*  HGB 13.4  HCT 39.7  MCV 91.3  PLT 003   Basic Metabolic Panel: Recent Labs  Lab 08/02/21 1022  NA 139  K 2.9*  CL 95*  CO2 26  GLUCOSE 112*  BUN 16  CREATININE 1.05  CALCIUM 8.8*  MG 1.6*   GFR: CrCl cannot be calculated (Unknown ideal weight.). Liver Function Tests: Recent Labs  Lab 08/02/21 1022  AST 34  ALT 34  ALKPHOS 81  BILITOT 1.4*  PROT 6.8  ALBUMIN 3.2*   No results for input(s): LIPASE, AMYLASE in the last 168 hours. No results for input(s): AMMONIA in the last 168 hours. Coagulation Profile: No results for input(s): INR, PROTIME in the last 168 hours. Cardiac Enzymes: No results for input(s): CKTOTAL, CKMB, CKMBINDEX, TROPONINI in the last 168 hours. BNP (last 3 results) No results for input(s): PROBNP in the last 8760 hours. HbA1C: No results for input(s): HGBA1C in the last 72 hours. CBG: Recent Labs  Lab 08/02/21 1153  GLUCAP 100*   Lipid Profile: No results for input(s): CHOL, HDL, LDLCALC, TRIG, CHOLHDL, LDLDIRECT in the last 72 hours. Thyroid Function Tests: No results for input(s): TSH, T4TOTAL, FREET4, T3FREE, THYROIDAB in the last 72 hours. Anemia Panel: No results for input(s): VITAMINB12, FOLATE, FERRITIN, TIBC, IRON, RETICCTPCT in the last 72 hours. Urine analysis:    Component Value Date/Time   COLORURINE AMBER (A) 08/02/2021 1351   APPEARANCEUR CLOUDY (A) 08/02/2021 1351   LABSPEC 1.020 08/02/2021 1351   PHURINE 7.5 08/02/2021 1351   GLUCOSEU NEGATIVE 08/02/2021 1351   HGBUR LARGE (A) 08/02/2021 1351   BILIRUBINUR MODERATE (A) 08/02/2021 1351   KETONESUR >80 (A) 08/02/2021 1351    PROTEINUR 100 (A) 08/02/2021 1351   NITRITE NEGATIVE 08/02/2021 1351   LEUKOCYTESUR LARGE (A) 08/02/2021 1351    Radiological Exams on Admission: DG Chest 2 View  Result Date: 08/02/2021 CLINICAL DATA:  Shortness of breath.  Weakness. EXAM: CHEST - 2  VIEW COMPARISON:  07/08/2021 FINDINGS: The heart size and mediastinal contours are within normal limits. Coronary artery stents noted. Mild scarring again noted in left lung base. Both lungs are otherwise clear. The visualized skeletal structures are unremarkable. IMPRESSION: No active cardiopulmonary disease. Electronically Signed   By: Marlaine Hind M.D.   On: 08/02/2021 11:22    EKG: Independently reviewed.  Sinus tachycardia, no acute ST changes.  Assessment/Plan Principal Problem:   AMS (altered mental status) Active Problems:   Complicated UTI (urinary tract infection)  (please populate well all problems here in Problem List. (For example, if patient is on BP meds at home and you resume or decide to hold them, it is a problem that needs to be her. Same for CAD, COPD, HLD and so on)  Acute metabolic encephalopathy -As of now, cannot compare patient current mentation with his baseline as family is not immediately available. -We will treat UTI and dehydration and then reevaluate tomorrow. -Consider CT had once patient more cooperative. -NPO and hold off p.o. medications until mentation improves. -Normal saline at 100 mL/h x 12 hours then reevaluate.  SIRS -Secondary UTI, no grossly sepsis this point. -Treat UTI, empirically for Enterococcus with ampicillin but waiting for urine culture result. -Other DDx, aspiration cannot be ruled out completely at this moment, but patient no longer hypoxic.  We will recheck chest x-ray tomorrow and consider speech evaluation and patient able to participate.  Hypokalemia -IV and p.o. replacement, recheck BMP tomorrow  Hypomagnesemia -Received 2 g IV magnesium sulfate.  Indwelling  catheter -Foley exchanged in ED today, unsure about how long patient has had the Foley. Family not available at the time.  EKG -Source patient had QTC pulm occasional auscultation, today's EKG showed normalized QTC.  HTN -As needed hydralazine for now  DVT prophylaxis: Lovenox Code Status: Full code for now Family Communication: Placed call to patient wife and son, unable to leave message. Disposition Plan: Expect more than 2 midnight hospital stay, consider PT evaluation once patient mentation improves. Consults called: None Admission status: Tele admit   Lequita Halt MD [facility] Pager [phone]  08/02/2021, 3:38 PM

## 2021-08-02 NOTE — ED Triage Notes (Signed)
Failure to thrive from home, wife is unable to take care of him and he can't take care of himself.  Poor intake per wife.  Foley in place upon arrival.  Patient was 90% with fire and placed on oxygen

## 2021-08-02 NOTE — ED Provider Notes (Signed)
[facility] EMERGENCY DEPARTMENT Provider Note   CSN: [id number] Arrival date & time: 08/02/21  1022     History  Chief Complaint  Patient presents with   Weakness    Jeffrey Zimmerman is a 86 y.o. male.  Pt is a 86 yo wm who has a pmhx of htn, cad, svt, and dementia.  He is from home.  His wife has been unable to care for him. He has had poor oral intake.  Pt does not know why he is here.        Home Medications Prior to Admission medications   Medication Sig Start Date End Date Taking? Authorizing Provider  acetaminophen (TYLENOL) 650 MG CR tablet Take 650 mg by mouth every 8 (eight) hours as needed for pain.    [provider]  amLODipine (NORVASC) 5 MG tablet TAKE 1 TABLET BY MOUTH DAILY Patient not taking: Reported on 07/08/2021 03/28/19   [name], MD  bisacodyl (DULCOLAX) 10 MG suppository Place 10 mg rectally daily as needed for moderate constipation.    [provider]  Cholecalciferol (VITAMIN D3) 50 MCG (2000 UT) TABS Take 2,000 Units by mouth in the morning.    [provider]  clopidogrel (PLAVIX) 75 MG tablet Take 1 tablet (75 mg total) by mouth daily. Patient not taking: Reported on 07/08/2021 07/05/20   [name], MD  Cranberry (AZO CRANBERRY GUMMIES) 250 MG CHEW Chew 250 mg by mouth daily.    [provider]  dutasteride (AVODART) 0.5 MG capsule Take 0.5 mg by mouth daily.    [provider]  Ensure (ENSURE) Take 237 mLs by mouth 3 (three) times daily between meals.    [provider]  erythromycin ophthalmic ointment Place 1 application into the right eye 4 (four) times daily. Started on 07-04-21    X 10 days    [provider]  escitalopram (LEXAPRO) 10 MG tablet Take 10 mg by mouth daily.    [provider]  fluticasone (FLONASE SENSIMIST) 27.5 MCG/SPRAY nasal spray Place 1 spray into the nose daily.    [provider]  lactase (LACTAID) 3000  units tablet Take 3,000-6,000 Units by mouth 3 (three) times daily with meals.    [provider]  lisinopril (ZESTRIL) 20 MG tablet Take 1 tablet (20 mg total) by mouth daily. 07/11/21   Mercy Riding, MD  magnesium hydroxide (MILK OF MAGNESIA) 400 MG/5ML suspension Take 30 mLs by mouth daily as needed for mild constipation.    [provider]  metoprolol tartrate (LOPRESSOR) 25 MG tablet TAKE 1/2 TABLET BY MOUTH TWICE A DAY Patient taking differently: Take 25 mg by mouth 2 (two) times daily. 10/09/20   Belva Crome, MD  nystatin ointment (MYCOSTATIN) Apply 1 application topically 2 (two) times daily. 06/29/21   Fatima Blank, MD  ondansetron (ZOFRAN) 4 MG tablet Take 1 tablet (4 mg total) by mouth every 8 (eight) hours as needed for nausea or vomiting. 06/23/21   Scheeler, Carola Rhine, PA-C  pravastatin (PRAVACHOL) 20 MG tablet TAKE 1 TABLET (20 MG TOTAL) BY MOUTH DAILY. PLEASE SCHEDULE APPOINTMENT FOR FUTURE REFILLS. Patient taking differently: Take 20 mg by mouth daily. 08/07/20   [name], MD  sennosides-docusate sodium (SENOKOT-S) 8.6-50 MG tablet Take 2 tablets by mouth at bedtime.    [provider]  Sodium Phosphates (RA SALINE ENEMA RE) Place 1 each rectally once. If no results with Biscodyl suppository  [provider]  tamsulosin (FLOMAX) 0.4 MG CAPS capsule Take 0.8 mg by mouth at bedtime. 11/19/19   [provider]  tetrahydrozoline-zinc (VISINE-AC) 0.05-0.25 % ophthalmic solution Place 1 drop into the right eye in the morning, at noon, in the evening, and at bedtime.    [provider]      Allergies    Codeine    Review of Systems   Review of Systems  Constitutional:  Positive for appetite change.  Neurological:  Positive for weakness.  All other systems reviewed and are negative.  Physical Exam Updated Vital Signs BP (!) 123/58    Pulse 63    Temp 100.1 F (37.8 C) (Oral)    Resp 19    SpO2 93%   Physical Exam Vitals and nursing note reviewed.  HENT:     Head: Normocephalic and atraumatic.     Right Ear: External ear normal.     Left Ear: External ear normal.     Nose: Nose normal.     Mouth/Throat:     Mouth: Mucous membranes are dry.  Eyes:     Conjunctiva/sclera:     Right eye: Right conjunctiva is injected. Exudate present.     Pupils: Pupils are equal, round, and reactive to light.  Cardiovascular:     Rate and Rhythm: Normal rate and regular rhythm.     Pulses: Normal pulses.     Heart sounds: Normal heart sounds.  Pulmonary:     Effort: Pulmonary effort is normal.     Breath sounds: Normal breath sounds.  Abdominal:     General: Abdomen is flat. Bowel sounds are normal.     Palpations: Abdomen is soft.  Musculoskeletal:        General: Normal range of motion.     Cervical back: Normal range of motion and neck supple.  Skin:    General: Skin is warm.     Capillary Refill: Capillary refill takes less than 2 seconds.  Neurological:     Mental Status: He is alert. Mental status is at baseline.  Psychiatric:        Mood and Affect: Mood normal.    ED Results / Procedures / Treatments   Labs (all labs ordered are listed, but only abnormal results are displayed) Labs Reviewed  CBC WITH DIFFERENTIAL/PLATELET - Abnormal; Notable for the following components:      Result Value   Neutro Abs 7.8 (*)    Abs Immature Granulocytes 0.11 (*)    All other components within normal limits  COMPREHENSIVE METABOLIC PANEL - Abnormal; Notable for the following components:   Potassium 2.9 (*)    Chloride 95 (*)    Glucose, Bld 112 (*)    Calcium 8.8 (*)    Albumin 3.2 (*)    Total Bilirubin 1.4 (*)    Anion gap 18 (*)    All other components within normal limits  URINALYSIS, ROUTINE W REFLEX MICROSCOPIC - Abnormal; Notable for the following components:   Color, Urine AMBER (*)    APPearance CLOUDY (*)    Hgb urine dipstick LARGE (*)    Bilirubin Urine MODERATE (*)     Ketones, ur >80 (*)    Protein, ur 100 (*)    Leukocytes,Ua LARGE (*)    All other components within normal limits  LACTIC ACID, PLASMA - Abnormal; Notable for the following components:   Lactic Acid, Venous 4.2 (*)    All other components within normal limits  MAGNESIUM -  Abnormal; Notable for the following components:   Magnesium 1.6 (*)    All other components within normal limits  URINALYSIS, MICROSCOPIC (REFLEX) - Abnormal; Notable for the following components:   Bacteria, UA RARE (*)    All other components within normal limits  CBG MONITORING, ED - Abnormal; Notable for the following components:   Glucose-Capillary 100 (*)    All other components within normal limits  RESP PANEL BY RT-PCR (FLU A&B, COVID) ARPGX2  URINE CULTURE  CULTURE, BLOOD (ROUTINE X 2)  CULTURE, BLOOD (ROUTINE X 2)    EKG EKG Interpretation  Date/Time:  Saturday August 02 2021 11:14:42 EST Ventricular Rate:  84 PR Interval:  190 QRS Duration: 84 QT Interval:  374 QTC Calculation: 441 R Axis:   -28 Text Interpretation: Normal sinus rhythm Nonspecific T wave abnormality Abnormal ECG When compared with ECG of 08-Jul-2021 01:58, PREVIOUS ECG IS PRESENT No significant change since last tracing Confirmed by Isla Pence [phone]) on 08/02/2021 11:21:52 AM  Radiology DG Chest 2 View  Result Date: 08/02/2021 CLINICAL DATA:  Shortness of breath.  Weakness. EXAM: CHEST - 2 VIEW COMPARISON:  07/08/2021 FINDINGS: The heart size and mediastinal contours are within normal limits. Coronary artery stents noted. Mild scarring again noted in left lung base. Both lungs are otherwise clear. The visualized skeletal structures are unremarkable. IMPRESSION: No active cardiopulmonary disease. Electronically Signed   By: Marlaine Hind M.D.   On: 08/02/2021 11:22    Procedures Procedures    Medications Ordered in ED Medications  cefTRIAXone (ROCEPHIN) 1 g in sodium chloride 0.9 % 100 mL IVPB (has no administration in  time range)  sodium chloride 0.9 % bolus 1,000 mL (has no administration in time range)  acetaminophen (TYLENOL) tablet 1,000 mg (1,000 mg Oral Given 08/02/21 1148)  LORazepam (ATIVAN) injection 1 mg (1 mg Intravenous Given 08/02/21 1210)  sodium chloride 0.9 % bolus 1,000 mL (0 mLs Intravenous Stopped 08/02/21 1436)  potassium chloride 10 mEq in 100 mL IVPB ( Intravenous Not Given 08/02/21 1444)  potassium chloride SA (KLOR-CON M) CR tablet 40 mEq (40 mEq Oral Given 08/02/21 1323)  magnesium sulfate IVPB 2 g 50 mL (0 g Intravenous Stopped 08/02/21 1436)    ED Course/ Medical Decision Making/ A&P                           Medical Decision Making  Labs and xrays reviewed by me.  Pt does have low potassium and magnesium.  These are replaced.  Pt does have a uti.  I started him on rocephin.  Lactic is elevated, so I gave him fluids.  Covid/flu neg.  CXR is clear.  Pt is agitated and has tried to hit the staff.  Pt given ativan and he's required restraints.   I have attempted calling his son with no answer or voice mail.  His wife does not speak [place] and I can't get ahold of her either.  Pt d/w Dr. Roosevelt Locks ([facility]) who will admit.  CRITICAL CARE Performed by: Isla Pence   Total critical care time: 30 minutes  Critical care time was exclusive of separately billable procedures and treating other patients.  Critical care was necessary to treat or prevent imminent or life-threatening deterioration.  Critical care was time spent personally by me on the following activities: development of treatment plan with patient and/or surrogate as well as nursing, discussions with consultants, evaluation of patient's response to treatment, examination of patient,  obtaining history from patient or surrogate, ordering and performing treatments and interventions, ordering and review of laboratory studies, ordering and review of radiographic studies, pulse oximetry and re-evaluation of patient's condition.          Final Clinical Impression(s) / ED Diagnoses Final diagnoses:  Dehydration  Hypokalemia  Hypomagnesemia  Metabolic encephalopathy  Urinary tract infection associated with indwelling urethral catheter, initial encounter ([place])  Failure to thrive in adult  Acute bacterial conjunctivitis of right eye    Rx / DC Orders ED Discharge Orders     None         Isla Pence, MD 08/02/21 1505

## 2021-08-03 ENCOUNTER — Inpatient Hospital Stay (HOSPITAL_COMMUNITY): Payer: Medicare (Managed Care)

## 2021-08-03 DIAGNOSIS — R410 Disorientation, unspecified: Secondary | ICD-10-CM | POA: Diagnosis not present

## 2021-08-03 DIAGNOSIS — H1031 Unspecified acute conjunctivitis, right eye: Secondary | ICD-10-CM

## 2021-08-03 LAB — CBC
HCT: 34.2 % — ABNORMAL LOW (ref 39.0–52.0)
Hemoglobin: 11.6 g/dL — ABNORMAL LOW (ref 13.0–17.0)
MCH: 30.7 pg (ref 26.0–34.0)
MCHC: 33.9 g/dL (ref 30.0–36.0)
MCV: 90.5 fL (ref 80.0–100.0)
Platelets: 174 10*3/uL (ref 150–400)
RBC: 3.78 MIL/uL — ABNORMAL LOW (ref 4.22–5.81)
RDW: 13.4 % (ref 11.5–15.5)
WBC: 11.1 10*3/uL — ABNORMAL HIGH (ref 4.0–10.5)
nRBC: 0 % (ref 0.0–0.2)

## 2021-08-03 LAB — URINE CULTURE

## 2021-08-03 LAB — MAGNESIUM: Magnesium: 1.8 mg/dL (ref 1.7–2.4)

## 2021-08-03 LAB — BASIC METABOLIC PANEL
Anion gap: 8 (ref 5–15)
BUN: 10 mg/dL (ref 8–23)
CO2: 24 mmol/L (ref 22–32)
Calcium: 7.7 mg/dL — ABNORMAL LOW (ref 8.9–10.3)
Chloride: 103 mmol/L (ref 98–111)
Creatinine, Ser: 0.76 mg/dL (ref 0.61–1.24)
GFR, Estimated: >60 mL/min (ref >60)
Glucose, Bld: 106 mg/dL — ABNORMAL HIGH (ref 70–99)
Potassium: 3.2 mmol/L — ABNORMAL LOW (ref 3.5–5.1)
Sodium: 135 mmol/L (ref 135–145)

## 2021-08-03 LAB — LACTIC ACID, PLASMA
Lactic Acid, Venous: 2 mmol/L (ref 0.5–1.9)
Lactic Acid, Venous: 1.3 mmol/L (ref 0.5–1.9)

## 2021-08-03 MED ORDER — CHLORHEXIDINE GLUCONATE CLOTH 2 % EX PADS
6.0000 | MEDICATED_PAD | Freq: Every day | CUTANEOUS | Status: DC
  Administered 2021-08-03 – 2021-08-04 (×2): 6 via TOPICAL

## 2021-08-03 NOTE — ED Notes (Signed)
Pts linen changed, pt cleaned, pt repositioned for comfort and given fluids

## 2021-08-03 NOTE — ED Notes (Signed)
Patient increasingly agitated and restless stating he "wants to go home". Medicated per orders.

## 2021-08-03 NOTE — Progress Notes (Signed)
HPI: Jeffrey Zimmerman is a 86 y.o. male with medical history significant of advanced dementia, BPH, indwelling Foley catheter, HTN, recent oral BCC status post excision, CAD, was brought in for fever and mentation changes.  Patient very agitated unable to provide any history, I tried to reach patient's 2 contacts his wife and son, neither pick up the phone.  Could not leave message either.  All history collected by reviewing ED chart and talking to ED staff.  Patient was hospitalized about 3 weeks ago for episodes of Enterococcus fasciitis UTI and was discharged.  Today, family brought patient in reporting that " they can no longer taking her patient".  Appeared that outpatient family also mentioned patient mentation deteriorated and become more confused and agitated at home.  And patient was found low-grade fever in the ED 100.9.  According to ER chart, patient was noticed to have borderline low O2 saturation of 90% on room air when EMS picked him up and was placed on oxygen.   Prior to Admission medications   Medication Sig Start Date End Date Taking? Authorizing Provider  acetaminophen (TYLENOL) 650 MG CR tablet Take 650 mg by mouth every 8 (eight) hours as needed for pain.    [provider]  amLODipine (NORVASC) 5 MG tablet TAKE 1 TABLET BY MOUTH DAILY Patient not taking: Reported on 07/08/2021 03/28/19   [name], MD  bisacodyl (DULCOLAX) 10 MG suppository Place 10 mg rectally daily as needed for moderate constipation.    [provider]  Cholecalciferol (VITAMIN D3) 50 MCG (2000 UT) TABS Take 2,000 Units by mouth in the morning.    [provider]  clopidogrel (PLAVIX) 75 MG tablet Take 1 tablet (75 mg total) by mouth daily. Patient not taking: Reported on 07/08/2021 07/05/20   [name], MD  Cranberry (AZO CRANBERRY GUMMIES) 250 MG CHEW Chew 250 mg by mouth daily.    [provider]  dutasteride (AVODART) 0.5 MG capsule Take 0.5 mg by  mouth daily.    [provider]  Ensure (ENSURE) Take 237 mLs by mouth 3 (three) times daily between meals.    [provider]  erythromycin ophthalmic ointment Place 1 application into the right eye 4 (four) times daily. Started on 07-04-21    X 10 days    [provider]  escitalopram (LEXAPRO) 10 MG tablet Take 10 mg by mouth daily.    [provider]  fluticasone (FLONASE SENSIMIST) 27.5 MCG/SPRAY nasal spray Place 1 spray into the nose daily.    [provider]  lactase (LACTAID) 3000 units tablet Take 3,000-6,000 Units by mouth 3 (three) times daily with meals.    [provider]  lisinopril (ZESTRIL) 20 MG tablet Take 1 tablet (20 mg total) by mouth daily. 07/11/21   Mercy Riding, MD  magnesium hydroxide (MILK OF MAGNESIA) 400 MG/5ML suspension Take 30 mLs by mouth daily as needed for mild constipation.    [provider]  metoprolol tartrate (LOPRESSOR) 25 MG tablet TAKE 1/2 TABLET BY MOUTH TWICE A DAY Patient taking differently: Take 25 mg by mouth 2 (two) times daily. 10/09/20   Belva Crome, MD  nystatin ointment (MYCOSTATIN) Apply 1 application topically 2 (two) times daily. 06/29/21   Fatima Blank, MD  ondansetron (ZOFRAN) 4 MG tablet Take 1 tablet (4 mg total) by mouth every 8 (eight) hours as needed for nausea or vomiting. 06/23/21   Scheeler, Carola Rhine, PA-C  pravastatin (PRAVACHOL) 20 MG  tablet TAKE 1 TABLET (20 MG TOTAL) BY MOUTH DAILY. PLEASE SCHEDULE APPOINTMENT FOR FUTURE REFILLS. Patient taking differently: Take 20 mg by mouth daily. 08/07/20   [name], MD  sennosides-docusate sodium (SENOKOT-S) 8.6-50 MG tablet Take 2 tablets by mouth at bedtime.    [provider]  Sodium Phosphates (RA SALINE ENEMA RE) Place 1 each rectally once. If no results with Biscodyl suppository    [provider]  tamsulosin (FLOMAX) 0.4 MG CAPS capsule Take 0.8 mg by mouth at bedtime. 11/19/19    [provider]  tetrahydrozoline-zinc (VISINE-AC) 0.05-0.25 % ophthalmic solution Place 1 drop into the right eye in the morning, at noon, in the evening, and at bedtime.    [provider]    Physical Exam: Vitals:   08/03/21 0030 08/03/21 0544 08/03/21 0847 08/03/21 1131  BP: (!) 145/67 (!) 106/92 112/78 125/63  Pulse: 87 78 82 64  Resp: 16 18 20 18   Temp:  98.4 F (36.9 C) 98 F (36.7 C)   TempSrc:  Oral Oral   SpO2: 99% 99% 99% 93%  Weight:   90.7 kg   Height:   5\' 8"  (1.727 m)     Constitutional: NAD, calm, comfortable Vitals:   08/03/21 0030 08/03/21 0544 08/03/21 0847 08/03/21 1131  BP: (!) 145/67 (!) 106/92 112/78 125/63  Pulse: 87 78 82 64  Resp: 16 18 20 18   Temp:  98.4 F (36.9 C) 98 F (36.7 C)   TempSrc:  Oral Oral   SpO2: 99% 99% 99% 93%  Weight:   90.7 kg   Height:   5\' 8"  (1.727 m)    Eyes: PERRL, lids and conjunctivae normal ENMT: Mucous membranes are dry. Posterior pharynx clear of any exudate or lesions.Normal dentition.  Neck: normal, supple, no masses, no thyromegaly Respiratory: clear to auscultation bilaterally, no wheezing, no crackles. Normal respiratory effort. No accessory muscle use.  Cardiovascular: Regular rate and rhythm, no murmurs / rubs / gallops. No extremity edema. 2+ pedal pulses. No carotid bruits.  Abdomen: no tenderness, no masses palpated. No hepatosplenomegaly. Bowel sounds positive.  Musculoskeletal: no clubbing / cyanosis. No joint deformity upper and lower extremities. Good ROM, no contractures. Normal muscle tone.  Skin: no rashes, lesions, ulcers. No induration Neurologic: Moving all limits Psychiatric: not agitated    Labs on Admission: I have personally reviewed following labs and imaging studies  CBC: Recent Labs  Lab 08/02/21 1022 08/03/21 0327  WBC 9.3 11.1*  NEUTROABS 7.8*  --   HGB 13.4 11.6*  HCT 39.7 34.2*  MCV 91.3 90.5  PLT 199 993   Basic Metabolic Panel: Recent Labs  Lab  08/02/21 1022 08/03/21 0327  NA 139 135  K 2.9* 3.2*  CL 95* 103  CO2 26 24  GLUCOSE 112* 106*  BUN 16 10  CREATININE 1.05 0.76  CALCIUM 8.8* 7.7*  MG 1.6* 1.8   GFR: Estimated Creatinine Clearance: 76.5 mL/min (by C-G formula based on SCr of 0.76 mg/dL). Liver Function Tests: Recent Labs  Lab 08/02/21 1022  AST 34  ALT 34  ALKPHOS 81  BILITOT 1.4*  PROT 6.8  ALBUMIN 3.2*   No results for input(s): LIPASE, AMYLASE in the last 168 hours. No results for input(s): AMMONIA in the last 168 hours. Coagulation Profile: No results for input(s): INR, PROTIME in the last 168 hours. Cardiac Enzymes: No results for input(s): CKTOTAL, CKMB, CKMBINDEX, TROPONINI in the last 168 hours. BNP (last 3 results) No results for input(s):  PROBNP in the last 8760 hours. HbA1C: No results for input(s): HGBA1C in the last 72 hours. CBG: Recent Labs  Lab 08/02/21 1153  GLUCAP 100*   Lipid Profile: No results for input(s): CHOL, HDL, LDLCALC, TRIG, CHOLHDL, LDLDIRECT in the last 72 hours. Thyroid Function Tests: No results for input(s): TSH, T4TOTAL, FREET4, T3FREE, THYROIDAB in the last 72 hours. Anemia Panel: No results for input(s): VITAMINB12, FOLATE, FERRITIN, TIBC, IRON, RETICCTPCT in the last 72 hours. Urine analysis:    Component Value Date/Time   COLORURINE AMBER (A) 08/02/2021 1351   APPEARANCEUR CLOUDY (A) 08/02/2021 1351   LABSPEC 1.020 08/02/2021 1351   PHURINE 7.5 08/02/2021 1351   GLUCOSEU NEGATIVE 08/02/2021 1351   HGBUR LARGE (A) 08/02/2021 1351   BILIRUBINUR MODERATE (A) 08/02/2021 1351   KETONESUR >80 (A) 08/02/2021 1351   PROTEINUR 100 (A) 08/02/2021 1351   NITRITE NEGATIVE 08/02/2021 1351   LEUKOCYTESUR LARGE (A) 08/02/2021 1351    Radiological Exams on Admission: DG Chest 2 View  Result Date: 08/02/2021 CLINICAL DATA:  Shortness of breath.  Weakness. EXAM: CHEST - 2 VIEW COMPARISON:  07/08/2021 FINDINGS: The heart size and mediastinal contours are within  normal limits. Coronary artery stents noted. Mild scarring again noted in left lung base. Both lungs are otherwise clear. The visualized skeletal structures are unremarkable. IMPRESSION: No active cardiopulmonary disease. Electronically Signed   By: Marlaine Hind M.D.   On: 08/02/2021 11:22   DG Chest Port 1 View  Result Date: 08/03/2021 CLINICAL DATA:  86 year old male with history of poor p.o. intake. EXAM: PORTABLE CHEST 1 VIEW COMPARISON:  Chest x-ray 08/02/2021. FINDINGS: Lung volumes are low. Elevation of the right hemidiaphragm. No consolidative airspace disease. No pleural effusions. No pneumothorax. No pulmonary nodule or mass noted. Pulmonary vasculature and the cardiomediastinal silhouette are within normal limits. Atherosclerosis in the thoracic aorta. IMPRESSION: 1. Low lung volumes without radiographic evidence of acute cardiopulmonary disease. 2. Elevation of the right hemidiaphragm. Electronically Signed   By: Vinnie Langton M.D.   On: 08/03/2021 07:20    EKG: Independently reviewed.  Sinus tachycardia, no acute ST changes.  Assessment/Plan Principal Problem:   AMS (altered mental status) Active Problems:   Complicated UTI (urinary tract infection)   Acute metabolic encephalopathy -As of now, cannot compare patient current mentation with his baseline as family is not immediately available. -We will treat UTI and dehydration, overall improved -Consider CT had once patient more cooperative. -Normal saline at 100 mL/h x 12 hours  - seems back to baseline  SIRS -Secondary UTI, no grossly sepsis this point. -Treat UTI, empirically for Enterococcus with ampicillin but waiting for urine culture result. -Other DDx, aspiration cannot be ruled out completely at this moment, but patient no longer hypoxic.  We will recheck chest x-ray tomorrow and consider speech evaluation and patient able to participate.  Hypokalemia -IV and p.o. replacement, recheck BMP  tomorrow  Hypomagnesemia -Received 2 g IV magnesium sulfate.  Indwelling catheter -Foley exchanged in ED today  EKG -Source patient had QTC pulm occasional auscultation, today's EKG showed normalized QTC.  HTN -As needed hydralazine for now   Nahia Nissan A MD [facility] Pager [phone]  08/03/2021, 1:26 PM

## 2021-08-03 NOTE — ED Notes (Signed)
Resting with eyes closed. Vitals stable.

## 2021-08-03 NOTE — ED Notes (Signed)
Trying to climb out of bed and kicking legs at staff. Repositioned in bed. Environment stimulus at a minimum. PO fluid sips given. Tolerated well.

## 2021-08-03 NOTE — ED Notes (Signed)
Patient had managed to get a restraint loose and was trying to climb out of the bed. Patient agitated and restless. Repositioned for comfort. PO fluids tolerated well. Refused to eat.

## 2021-08-03 NOTE — ED Notes (Signed)
Pt requesting water, water provided.

## 2021-08-03 NOTE — ED Notes (Signed)
Moaning and groaning. States his back and legs hurt. Repositioned. Continues to ask to go home and try to get out of bed. Soft wrist restraints remain in place.

## 2021-08-03 NOTE — ED Notes (Signed)
Spouse arrived to bedside. Trial off of restraints at this time while wife is in room. Wife to attempt to feed patient some applesauce.

## 2021-08-04 DIAGNOSIS — N390 Urinary tract infection, site not specified: Secondary | ICD-10-CM | POA: Diagnosis not present

## 2021-08-04 DIAGNOSIS — R410 Disorientation, unspecified: Secondary | ICD-10-CM | POA: Diagnosis not present

## 2021-08-04 LAB — CBC WITH DIFFERENTIAL/PLATELET
Abs Immature Granulocytes: 0.07 10*3/uL (ref 0.00–0.07)
Basophils Absolute: 0 10*3/uL (ref 0.0–0.1)
Basophils Relative: 0 %
Eosinophils Absolute: 0.3 10*3/uL (ref 0.0–0.5)
Eosinophils Relative: 3 %
HCT: 35.1 % — ABNORMAL LOW (ref 39.0–52.0)
Hemoglobin: 11.5 g/dL — ABNORMAL LOW (ref 13.0–17.0)
Immature Granulocytes: 1 %
Lymphocytes Relative: 7 %
Lymphs Abs: 0.7 10*3/uL (ref 0.7–4.0)
MCH: 29.4 pg (ref 26.0–34.0)
MCHC: 32.8 g/dL (ref 30.0–36.0)
MCV: 89.8 fL (ref 80.0–100.0)
Monocytes Absolute: 0.4 10*3/uL (ref 0.1–1.0)
Monocytes Relative: 4 %
Neutro Abs: 8.9 10*3/uL — ABNORMAL HIGH (ref 1.7–7.7)
Neutrophils Relative %: 85 %
Platelets: 190 10*3/uL (ref 150–400)
RBC: 3.91 MIL/uL — ABNORMAL LOW (ref 4.22–5.81)
RDW: 13.6 % (ref 11.5–15.5)
WBC: 10.4 10*3/uL (ref 4.0–10.5)
nRBC: 0 % (ref 0.0–0.2)

## 2021-08-04 LAB — BASIC METABOLIC PANEL
Anion gap: 14 (ref 5–15)
BUN: 9 mg/dL (ref 8–23)
CO2: 23 mmol/L (ref 22–32)
Calcium: 8.3 mg/dL — ABNORMAL LOW (ref 8.9–10.3)
Chloride: 102 mmol/L (ref 98–111)
Creatinine, Ser: 0.78 mg/dL (ref 0.61–1.24)
GFR, Estimated: >60 mL/min (ref >60)
Glucose, Bld: 105 mg/dL — ABNORMAL HIGH (ref 70–99)
Potassium: 3.2 mmol/L — ABNORMAL LOW (ref 3.5–5.1)
Sodium: 139 mmol/L (ref 135–145)

## 2021-08-04 MED ORDER — ERYTHROMYCIN 5 MG/GM OP OINT
TOPICAL_OINTMENT | Freq: Four times a day (QID) | OPHTHALMIC | Status: DC
  Filled 2021-08-04: qty 3.5

## 2021-08-04 NOTE — Plan of Care (Signed)
  Problem: Education: Goal: Knowledge of General Education information will improve Description: Including pain rating scale, medication(s)/side effects and non-pharmacologic comfort measures Outcome: Not Progressing   Problem: Health Behavior/Discharge Planning: Goal: Ability to manage health-related needs will improve Outcome: Not Progressing   

## 2021-08-04 NOTE — Progress Notes (Signed)
Patient was able to rest last night after arriving to the department and receiving one dose of haldol. He tolerated well and was very cooperative with nursing staff. Patient was removed from restraints at 2300. He is pleasant this morning wanting to know where he is and why he is here.

## 2021-08-04 NOTE — Evaluation (Signed)
Physical Therapy Evaluation Patient Details Name: Jeffrey Zimmerman MRN: [id number] DOB: Nov 25, 1935 Today's Date: 08/04/2021  History of Present Illness  Pt is 86 yo male who was brought to the ED for fever and AMS on 08/02/21.  Pt admitted with encephalopathy.  Per chart, family with difficulty taking care of pt at home. Pt with hx of advanced dementia, BPH, indwelling foley catheter, HTN, recent oral BCC s/p excision, and CAD.  Clinical Impression   Pt admitted with above diagnosis. Pt is questionable historian due to dementia but information provided does seem to agree with documentation.  Pt reports he is from home with wife and ambulates with RW.  Per chart, family brought pt in due to unable to manage at home with AMS.  Today , pt oriented x 4, followed all commands, and was pleasant and cooperative with PT.  Did note in chart were pt agitated and confused last night with wrist restraints in room.  Today, pt presented with strength 5/5 throughout and performed transfers and ambulated 200' with supervision to min guard level with cues.  Suspect that pt is near baseline mobility status, but will maintain on PT caseload to determine consistency of mobility, as well as, Pt at risk for declining mobility while hospitalized if not mobilized.   At this time, do not recommend SNF at d/c for skilled rehab at d/c; if pt unable to manage at home due to cognition - may need to consider long term care without therapy. Pt currently with functional limitations due to the deficits listed below (see PT Problem List). Pt will benefit from skilled PT to increase their independence and safety with mobility to allow discharge to the venue listed below.          Recommendations for follow up therapy are one component of a multi-disciplinary discharge planning process, led by the attending physician.  Recommendations may be updated based on patient status, additional functional criteria and insurance authorization.  Follow  Up Recommendations Other (comment) (No PT follow up; Home but if family unable to manage due to cognition/dementia/sundowning may need long-term institutional care without therapy follow-up)    Assistance Recommended at Discharge Frequent or constant Supervision/Assistance  Patient can return home with the following  A little help with walking and/or transfers;A little help with bathing/dressing/bathroom;Assistance with cooking/housework;Direct supervision/assist for financial management;Assist for transportation;Direct supervision/assist for medications management    Equipment Recommendations None recommended by PT  Recommendations for Other Services       Functional Status Assessment Patient has not had a recent decline in their functional status     Precautions / Restrictions Precautions Precautions: Fall      Mobility  Bed Mobility Overal bed mobility: Needs Assistance Bed Mobility: Rolling;Supine to Sit Rolling: Supervision   Supine to sit: Supervision;HOB elevated          Transfers Overall transfer level: Needs assistance Equipment used: Rolling walker (2 wheels) Transfers: Sit to/from Stand Sit to Stand: Min guard           General transfer comment: Min guard for safety, performed x 2; min cues to push up with hands    Ambulation/Gait Ambulation/Gait assistance: Min guard;Supervision Gait Distance (Feet): 200 Feet Assistive device: Rolling walker (2 wheels) Gait Pattern/deviations: Step-through pattern;Decreased stride length;Trunk flexed Gait velocity: decreased     General Gait Details: Trunk mildly flexed - corrected with cues; Min cues for direction; Progressed from min guard to close supervision  Stairs  Wheelchair Mobility    Modified Rankin (Stroke Patients Only)       Balance Overall balance assessment: Needs assistance Sitting-balance support: No upper extremity supported Sitting balance-Leahy Scale: Good      Standing balance support: Bilateral upper extremity supported;No upper extremity supported Standing balance-Leahy Scale: Fair Standing balance comment: Could static stand  no upper extremity support; RW to ambulate                             Pertinent Vitals/Pain Pain Assessment: No/denies pain    Home Living Family/patient expects to be discharged to:: Unsure Living Arrangements: Spouse/significant other Available Help at Discharge: Family;Available PRN/intermittently Type of Home: House Home Access: Level entry       Home Layout: One level Home Equipment: Conservation officer, nature (2 wheels) Additional Comments: Pt somewhat questionable historian but does report he lives with spouse and has RW (seems to agree with hx in chart); no family present    Prior Function Prior Level of Function : Independent/Modified Independent;Patient poor historian/Family not available             Mobility Comments: Per pt he can ambulate with RW ADLs Comments: Per pt he can do his own basic ADLs     Hand Dominance        Extremity/Trunk Assessment   Upper Extremity Assessment Upper Extremity Assessment: Overall WFL for tasks assessed (ROM WFL; MMT 5/5 bil)    Lower Extremity Assessment Lower Extremity Assessment: Overall WFL for tasks assessed (ROM WFL; MMT 5/5)    Cervical / Trunk Assessment Cervical / Trunk Assessment: Other exceptions Cervical / Trunk Exceptions: noted facial droop on right side (eyes and mouth) - suspect chronic due to redness of eye, notified MD who reports is chronic  Communication   Communication: No difficulties  Cognition Arousal/Alertness: Awake/alert Behavior During Therapy: WFL for tasks assessed/performed Overall Cognitive Status: History of cognitive impairments - at baseline                                 General Comments: Pt was able to follow all commands - did require repetition at times due to very [place].  He was oriented to  self, month/year, and [facility] (even stated he was on 6th floor - he is on 5th).  In regards to situation, pt reports "I'm confused as to why I am here, I think my wife brought me in."        General Comments General comments (skin integrity, edema, etc.): VSS    Exercises     Assessment/Plan    PT Assessment Patient needs continued PT services  PT Problem List Decreased safety awareness;Decreased activity tolerance;Decreased balance;Decreased knowledge of use of DME;Decreased cognition;Decreased mobility       PT Treatment Interventions DME instruction;Therapeutic activities;Gait training;Therapeutic exercise;Patient/family education;Stair training;Balance training;Functional mobility training    PT Goals (Current goals can be found in the Care Plan section)  Acute Rehab PT Goals Patient Stated Goal: return home PT Goal Formulation: With patient Time For Goal Achievement: 08/18/21 Potential to Achieve Goals: Good    Frequency Min 3X/week     Co-evaluation               AM-PAC PT "6 Clicks" Mobility  Outcome Measure Help needed turning from your back to your side while in a flat bed without using bedrails?: A Little Help needed moving  from lying on your back to sitting on the side of a flat bed without using bedrails?: A Little Help needed moving to and from a bed to a chair (including a wheelchair)?: A Little Help needed standing up from a chair using your arms (e.g., wheelchair or bedside chair)?: A Little Help needed to walk in hospital room?: A Little Help needed climbing 3-5 steps with a railing? : A Little 6 Click Score: 18    End of Session Equipment Utilized During Treatment: Gait belt Activity Tolerance: Patient tolerated treatment well Patient left: with chair alarm set;in chair;with call bell/phone within reach Nurse Communication: Mobility status PT Visit Diagnosis: Other abnormalities of gait and mobility (R26.89)    Time: 0141-0301 PT Time  Calculation (min) (ACUTE ONLY): 30 min   Charges:   PT Evaluation $PT Eval Low Complexity: 1 Low PT Treatments $Gait Training: 8-22 mins        Abran Richard, PT Acute Rehab Services Pager [phone] [facility] [place] 08/04/2021, 1:45 PM

## 2021-08-04 NOTE — Progress Notes (Signed)
°  Transition of Care [facility]) Screening Note   Patient Details  Name: Jeffrey Zimmerman Date of Birth: [birth year]   Transition of Care [facility]) CM/SW Contact:    Tom-Johnson, Renea Ee, RN Phone Number: 08/04/2021, 3:17 PM  Transition of Care Department (TOC) has reviewed patient and no TOC needs have been identified at this time. CM notified [facility] of patient's admission.We will continue to monitor patient advancement through interdisciplinary progression rounds. If new patient transition needs arise, please place a TOC consult.

## 2021-08-04 NOTE — Progress Notes (Signed)
HPI: Jeffrey Zimmerman is a 86 y.o. male with medical history significant of advanced dementia, BPH, indwelling Foley catheter, HTN, recent oral BCC status post excision, CAD, was brought in for fever and mentation changes.  Patient very agitated unable to provide any history, I tried to reach patient's 2 contacts his wife and son, neither pick up the phone.  Could not leave message either.  All history collected by reviewing ED chart and talking to ED staff.  Patient was hospitalized about 3 weeks ago for episodes of Enterococcus fasciitis UTI and was discharged.  Today, family brought patient in reporting that " they can no longer taking her patient".  Appeared that outpatient family also mentioned patient mentation deteriorated and become more confused and agitated at home.  And patient was found low-grade fever in the ED 100.9.  According to ER chart, patient was noticed to have borderline low O2 saturation of 90% on room air when EMS picked him up and was placed on oxygen.  Subjective: Much more alert today and ready to go home   Prior to Admission medications   Medication Sig Start Date End Date Taking? Authorizing Provider  acetaminophen (TYLENOL) 650 MG CR tablet Take 650 mg by mouth every 8 (eight) hours as needed for pain.    [provider]  amLODipine (NORVASC) 5 MG tablet TAKE 1 TABLET BY MOUTH DAILY Patient not taking: Reported on 07/08/2021 03/28/19   [name], MD  bisacodyl (DULCOLAX) 10 MG suppository Place 10 mg rectally daily as needed for moderate constipation.    [provider]  Cholecalciferol (VITAMIN D3) 50 MCG (2000 UT) TABS Take 2,000 Units by mouth in the morning.    [provider]  clopidogrel (PLAVIX) 75 MG tablet Take 1 tablet (75 mg total) by mouth daily. Patient not taking: Reported on 07/08/2021 07/05/20   [name], MD  Cranberry (AZO CRANBERRY GUMMIES) 250 MG CHEW Chew 250 mg by mouth daily.    [provider]   dutasteride (AVODART) 0.5 MG capsule Take 0.5 mg by mouth daily.    [provider]  Ensure (ENSURE) Take 237 mLs by mouth 3 (three) times daily between meals.    [provider]  erythromycin ophthalmic ointment Place 1 application into the right eye 4 (four) times daily. Started on 07-04-21    X 10 days    [provider]  escitalopram (LEXAPRO) 10 MG tablet Take 10 mg by mouth daily.    [provider]  fluticasone (FLONASE SENSIMIST) 27.5 MCG/SPRAY nasal spray Place 1 spray into the nose daily.    [provider]  lactase (LACTAID) 3000 units tablet Take 3,000-6,000 Units by mouth 3 (three) times daily with meals.    [provider]  lisinopril (ZESTRIL) 20 MG tablet Take 1 tablet (20 mg total) by mouth daily. 07/11/21   Mercy Riding, MD  magnesium hydroxide (MILK OF MAGNESIA) 400 MG/5ML suspension Take 30 mLs by mouth daily as needed for mild constipation.    [provider]  metoprolol tartrate (LOPRESSOR) 25 MG tablet TAKE 1/2 TABLET BY MOUTH TWICE A DAY Patient taking differently: Take 25 mg by mouth 2 (two) times daily. 10/09/20   Belva Crome, MD  nystatin ointment (MYCOSTATIN) Apply 1 application topically 2 (two) times daily. 06/29/21   Fatima Blank, MD  ondansetron (ZOFRAN) 4 MG tablet Take 1 tablet (4 mg total) by mouth every 8 (eight) hours as needed for nausea or vomiting. 06/23/21  Scheeler, Carola Rhine, PA-C  pravastatin (PRAVACHOL) 20 MG tablet TAKE 1 TABLET (20 MG TOTAL) BY MOUTH DAILY. PLEASE SCHEDULE APPOINTMENT FOR FUTURE REFILLS. Patient taking differently: Take 20 mg by mouth daily. 08/07/20   [name], MD  sennosides-docusate sodium (SENOKOT-S) 8.6-50 MG tablet Take 2 tablets by mouth at bedtime.    [provider]  Sodium Phosphates (RA SALINE ENEMA RE) Place 1 each rectally once. If no results with Biscodyl suppository    [provider]  tamsulosin (FLOMAX) 0.4 MG CAPS  capsule Take 0.8 mg by mouth at bedtime. 11/19/19   [provider]  tetrahydrozoline-zinc (VISINE-AC) 0.05-0.25 % ophthalmic solution Place 1 drop into the right eye in the morning, at noon, in the evening, and at bedtime.    [provider]    Physical Exam: Vitals:   08/03/21 1929 08/04/21 0000 08/04/21 0332 08/04/21 0926  BP: 136/62 137/75 (!) 145/74 (!) 137/99  Pulse: 65 78 63 78  Resp: 17 18 18 18   Temp: 98 F (36.7 C) 97.9 F (36.6 C) 97.6 F (36.4 C) 99.1 F (37.3 C)  TempSrc: Oral Oral Oral Oral  SpO2: 98% 99% 99% 96%  Weight:   78.3 kg   Height:        Constitutional: NAD, calm, comfortable Vitals:   08/03/21 1929 08/04/21 0000 08/04/21 0332 08/04/21 0926  BP: 136/62 137/75 (!) 145/74 (!) 137/99  Pulse: 65 78 63 78  Resp: 17 18 18 18   Temp: 98 F (36.7 C) 97.9 F (36.6 C) 97.6 F (36.4 C) 99.1 F (37.3 C)  TempSrc: Oral Oral Oral Oral  SpO2: 98% 99% 99% 96%  Weight:   78.3 kg   Height:       Eyes: PERRL, lids and conjunctivae normal ENMT: Mucous membranes are dry. Posterior pharynx clear of any exudate or lesions.Normal dentition.  Neck: normal, supple, no masses, no thyromegaly Respiratory: clear to auscultation bilaterally, no wheezing, no crackles. Normal respiratory effort. No accessory muscle use.  Cardiovascular: Regular rate and rhythm, no murmurs / rubs / gallops. No extremity edema. 2+ pedal pulses. No carotid bruits.  Abdomen: no tenderness, no masses palpated. No hepatosplenomegaly. Bowel sounds positive.  Musculoskeletal: no clubbing / cyanosis. No joint deformity upper and lower extremities. Good ROM, no contractures. Normal muscle tone.  Skin: no rashes, lesions, ulcers. No induration Neurologic: Moving all limits Psychiatric: not agitated    Labs on Admission: I have personally reviewed following labs and imaging studies  CBC: Recent Labs  Lab 08/02/21 1022 08/03/21 0327 08/04/21 0812  WBC 9.3 11.1* 10.4  NEUTROABS  7.8*  --  8.9*  HGB 13.4 11.6* 11.5*  HCT 39.7 34.2* 35.1*  MCV 91.3 90.5 89.8  PLT 199 174 166   Basic Metabolic Panel: Recent Labs  Lab 08/02/21 1022 08/03/21 0327 08/04/21 0812  NA 139 135 139  K 2.9* 3.2* 3.2*  CL 95* 103 102  CO2 26 24 23   GLUCOSE 112* 106* 105*  BUN 16 10 9   CREATININE 1.05 0.76 0.78  CALCIUM 8.8* 7.7* 8.3*  MG 1.6* 1.8  --    GFR: Estimated Creatinine Clearance: 67.7 mL/min (by C-G formula based on SCr of 0.78 mg/dL). Liver Function Tests: Recent Labs  Lab 08/02/21 1022  AST 34  ALT 34  ALKPHOS 81  BILITOT 1.4*  PROT 6.8  ALBUMIN 3.2*   No results for input(s): LIPASE, AMYLASE in the last 168 hours. No results for input(s): AMMONIA in the last 168 hours.  Coagulation Profile: No results for input(s): INR, PROTIME in the last 168 hours. Cardiac Enzymes: No results for input(s): CKTOTAL, CKMB, CKMBINDEX, TROPONINI in the last 168 hours. BNP (last 3 results) No results for input(s): PROBNP in the last 8760 hours. HbA1C: No results for input(s): HGBA1C in the last 72 hours. CBG: Recent Labs  Lab 08/02/21 1153  GLUCAP 100*   Lipid Profile: No results for input(s): CHOL, HDL, LDLCALC, TRIG, CHOLHDL, LDLDIRECT in the last 72 hours. Thyroid Function Tests: No results for input(s): TSH, T4TOTAL, FREET4, T3FREE, THYROIDAB in the last 72 hours. Anemia Panel: No results for input(s): VITAMINB12, FOLATE, FERRITIN, TIBC, IRON, RETICCTPCT in the last 72 hours. Urine analysis:    Component Value Date/Time   COLORURINE AMBER (A) 08/02/2021 1351   APPEARANCEUR CLOUDY (A) 08/02/2021 1351   LABSPEC 1.020 08/02/2021 1351   PHURINE 7.5 08/02/2021 1351   GLUCOSEU NEGATIVE 08/02/2021 1351   HGBUR LARGE (A) 08/02/2021 1351   BILIRUBINUR MODERATE (A) 08/02/2021 1351   KETONESUR >80 (A) 08/02/2021 1351   PROTEINUR 100 (A) 08/02/2021 1351   NITRITE NEGATIVE 08/02/2021 1351   LEUKOCYTESUR LARGE (A) 08/02/2021 1351    Radiological Exams on  Admission: DG Chest 2 View  Result Date: 08/02/2021 CLINICAL DATA:  Shortness of breath.  Weakness. EXAM: CHEST - 2 VIEW COMPARISON:  07/08/2021 FINDINGS: The heart size and mediastinal contours are within normal limits. Coronary artery stents noted. Mild scarring again noted in left lung base. Both lungs are otherwise clear. The visualized skeletal structures are unremarkable. IMPRESSION: No active cardiopulmonary disease. Electronically Signed   By: Marlaine Hind M.D.   On: 08/02/2021 11:22   DG Chest Port 1 View  Result Date: 08/03/2021 CLINICAL DATA:  86 year old male with history of poor p.o. intake. EXAM: PORTABLE CHEST 1 VIEW COMPARISON:  Chest x-ray 08/02/2021. FINDINGS: Lung volumes are low. Elevation of the right hemidiaphragm. No consolidative airspace disease. No pleural effusions. No pneumothorax. No pulmonary nodule or mass noted. Pulmonary vasculature and the cardiomediastinal silhouette are within normal limits. Atherosclerosis in the thoracic aorta. IMPRESSION: 1. Low lung volumes without radiographic evidence of acute cardiopulmonary disease. 2. Elevation of the right hemidiaphragm. Electronically Signed   By: Vinnie Langton M.D.   On: 08/03/2021 07:20    EKG: Independently reviewed.  Sinus tachycardia, no acute ST changes.  Assessment/Plan Principal Problem:   AMS (altered mental status) Active Problems:   Complicated UTI (urinary tract infection)   Acute metabolic encephalopathy -As of now, cannot compare patient current mentation with his baseline as family is not immediately available. -We will treat UTI and dehydration, overall improved -Consider CT had once patient more cooperative. -Normal saline at 100 mL/h x 12 hours  - seems back to baseline  SIRS -Secondary UTI, no grossly sepsis this point. -Treat UTI, empirically for Enterococcus with ampicillin but waiting for urine culture result.  Hypokalemia -IV and p.o. replacement, recheck BMP  tomorrow  Hypomagnesemia -Received 2 g IV magnesium sulfate.  Indwelling catheter -Foley exchanged in ED on day of admission  EKG -Source patient had QTC pulm occasional auscultation, today's EKG showed normalized QTC.  HTN -As needed hydralazine for now  Obtain PT evaluation.  Will be ready for likely for discharge in the next 24 to 48 hours   Hawthorne Day A MD [facility] Pager [phone]  08/04/2021, 10:21 AM   Patient ID: Jeffrey Zimmerman, male   DOB: [birth year], 86 y.o.   MRN: [id number]

## 2021-08-05 ENCOUNTER — Other Ambulatory Visit (HOSPITAL_COMMUNITY): Payer: Medicare (Managed Care)

## 2021-08-05 ENCOUNTER — Other Ambulatory Visit (HOSPITAL_COMMUNITY): Payer: Self-pay

## 2021-08-05 DIAGNOSIS — R410 Disorientation, unspecified: Secondary | ICD-10-CM | POA: Diagnosis not present

## 2021-08-05 MED ORDER — AMOXICILLIN 500 MG PO CAPS
500.0000 mg | ORAL_CAPSULE | Freq: Two times a day (BID) | ORAL | 0 refills | Status: DC
  Filled 2021-08-05: qty 14, 7d supply, fill #0

## 2021-08-05 MED ORDER — POTASSIUM CHLORIDE CRYS ER 20 MEQ PO TBCR
40.0000 meq | EXTENDED_RELEASE_TABLET | Freq: Once | ORAL | Status: AC
  Administered 2021-08-05: 40 meq via ORAL
  Filled 2021-08-05: qty 2

## 2021-08-05 MED ORDER — ERYTHROMYCIN 5 MG/GM OP OINT
TOPICAL_OINTMENT | Freq: Four times a day (QID) | OPHTHALMIC | 0 refills | Status: DC
  Filled 2021-08-05: qty 3.5, fill #0

## 2021-08-05 MED ORDER — ERYTHROMYCIN 5 MG/GM OP OINT
1.0000 "application " | TOPICAL_OINTMENT | Freq: Four times a day (QID) | OPHTHALMIC | 0 refills | Status: AC
  Filled 2021-08-05: qty 3.5, 1d supply, fill #0

## 2021-08-05 NOTE — TOC Transition Note (Signed)
Transition of Care [facility]) - CM/SW Discharge Note   Patient Details  Name: Jeffrey Zimmerman MRN: [id number] Date of Birth: [birth year]  Transition of Care [facility]) CM/SW Contact:  Tom-Johnson, Renea Ee, RN Phone Number: 08/05/2021, 2:06 PM   Clinical Narrative:    Patient is scheduled for discharged. PT/OT recommended SNF. CM called and spoke with Marcie Bal, SW with [facility] and informed her about PT's recommendations. Marcie Bal in agreement with SNF that accepts [facility] at this time but would prefer patient return home and continue care by [facility] staff. [employer] and [employer] mentioned. SW notified and started process for SNF. CM notified by nurse that [facility]  transporting patient home. CM told her that SW was in the process of getting patient to SNF for rehab per recommendation. RN told CM that Marcie Bal called her and told her they were picking up patient and transporting home. No further TOC needs noted.    Final next level of care: Home/Self Care (Patient is with [facility] Barriers to Discharge: Barriers Resolved   Patient Goals and CMS Choice Patient states their goals for this hospitalization and ongoing recovery are:: To return home CMS Medicare.gov Compare Post Acute Care list provided to:: Patient Choice offered to / list presented to : NA  Discharge Placement                Patient to be transferred to facility by: [facility] transportation.      Discharge Plan and Services                DME Arranged: N/A DME Agency: NA       HH Arranged: NA HH Agency: NA        Social Determinants of Health (SDOH) Interventions     Readmission Risk Interventions Readmission Risk Prevention Plan 08/04/2021  Transportation Screening Complete  Medication Review [employer]) Complete  PCP or Specialist appointment within 3-5 days of discharge Not Complete  PCP/Specialist Appt Not Complete comments [place] not medically ready for discharge.  [place] or Home Care Consult  Complete  SW Recovery Care/Counseling Consult Complete  Palliative Care Screening Not Applicable  Skilled Nursing Facility Not Applicable  Some recent data might be hidden

## 2021-08-05 NOTE — NC FL2 (Signed)
[place] LEVEL OF CARE SCREENING TOOL     IDENTIFICATION  Patient Name: Jeffrey Zimmerman Birthdate: 24-Oct-1935 Sex: male Admission Date (Current Location): 08/02/2021  [facility] and [place] Number:  Herbalist and Address:  [facility] [place] [address], [place], [place] [address]      Provider Number: [id number]  Attending Physician Name and Address:  Phillips Grout, MD  Relative Name and Phone Number:  Paul Half)   [phone]    Current Level of Care: Hospital Recommended Level of Care: [place] Prior Approval Number:    Date Approved/Denied:   PASRR Number: [id number]  Discharge Plan: SNF    Current Diagnoses: Patient Active Problem List   Diagnosis Date Noted   Acute bacterial conjunctivitis of right eye    AMS (altered mental status) 50/09/7046   Complicated UTI (urinary tract infection) 07/08/2021   Hypokalemia 07/08/2021   Prolonged QT interval 07/08/2021   Alzheimer's dementia ([place]) 07/08/2021   Basal cell carcinoma (BCC) 06/23/2021   Bilateral lower extremity edema 04/10/2016   Old myocardial infarction 08/10/2013   Mixed hyperlipidemia 08/10/2013   Other malaise and fatigue 08/08/2013   Hypertension    Coronary artery disease    Cancer (HCC)    Basal cell carcinoma    Gout    Irritable bowel syndrome    SVT (supraventricular tachycardia) (HCC)     Orientation RESPIRATION BLADDER Height & Weight     Self  Normal Continent Weight: 166 lb 10.7 oz (75.6 kg) Height:  5\' 8"  (172.7 cm)  BEHAVIORAL SYMPTOMS/MOOD NEUROLOGICAL BOWEL NUTRITION STATUS      Incontinent Diet (see d/c summary)  AMBULATORY STATUS COMMUNICATION OF NEEDS Skin   Supervision Verbally Normal                       Personal Care Assistance Level of Assistance  Bathing, Feeding, Dressing Bathing Assistance: Limited assistance Feeding assistance: Limited assistance Dressing Assistance: Limited assistance      Functional Limitations Info  Sight, Hearing, Speech Sight Info: Adequate Hearing Info: Impaired Speech Info: Adequate    SPECIAL CARE FACTORS FREQUENCY                       Contractures Contractures Info: Not present    Additional Factors Info  Code Status, Allergies Code Status Info: Full Allergies Info: Codeine           Current Medications (08/05/2021):  This is the current hospital active medication list Current Facility-Administered Medications  Medication Dose Route Frequency Provider Last Rate Last Admin   acetaminophen (TYLENOL) tablet 650 mg  650 mg Oral Q6H PRN Wynetta Fines T, MD       Or   acetaminophen (TYLENOL) suppository 650 mg  650 mg Rectal Q6H PRN Wynetta Fines T, MD   650 mg at 08/03/21 0009   ampicillin (OMNIPEN) 1 g in sodium chloride 0.9 % 100 mL IVPB  1 g Intravenous Q6H Wynetta Fines T, MD 300 mL/hr at 08/05/21 1211 1 g at 08/05/21 1211   Chlorhexidine Gluconate Cloth 2 % PADS 6 each  6 each Topical Daily Lequita Halt, MD   6 each at 08/04/21 1057   dutasteride (AVODART) capsule 0.5 mg  0.5 mg Oral Daily Wynetta Fines T, MD   0.5 mg at 08/05/21 0904   enoxaparin (LOVENOX) injection 40 mg  40 mg Subcutaneous Q24H Lequita Halt, MD  40 mg at 08/04/21 1744   erythromycin ophthalmic ointment   Right Eye Q6H Phillips Grout, MD   Given at 08/05/21 1215   fluticasone (FLONASE) 50 MCG/ACT nasal spray 1 spray  1 spray Each Nare Daily Wynetta Fines T, MD   1 spray at 08/04/21 1057   haloperidol lactate (HALDOL) injection 2 mg  2 mg Intravenous Q6H PRN Wynetta Fines T, MD   2 mg at 08/05/21 1208   hydrALAZINE (APRESOLINE) injection 5 mg  5 mg Intravenous Q6H PRN Wynetta Fines T, MD   5 mg at 08/05/21 0550   HYDROmorphone (DILAUDID) injection 0.5-1 mg  0.5-1 mg Intravenous Q2H PRN Wynetta Fines T, MD   1 mg at 08/03/21 1108   naphazoline-glycerin (CLEAR EYES REDNESS) ophth solution 1 drop  1 drop Right Eye QID PRN Lequita Halt, MD       ondansetron [facility]) tablet 4  mg  4 mg Oral Q6H PRN Wynetta Fines T, MD       Or   ondansetron [facility]) injection 4 mg  4 mg Intravenous Q6H PRN Wynetta Fines T, MD       tamsulosin [facility]) capsule 0.8 mg  0.8 mg Oral QHS Wynetta Fines T, MD   0.8 mg at 08/04/21 2124     Discharge Medications: Please see discharge summary for a list of discharge medications.  Relevant Imaging Results:  Relevant Lab Results:   Additional Information SSN [id number]  Milinda Antis, LCSWA

## 2021-08-05 NOTE — Care Management Important Message (Signed)
Important Message  Patient Details  Name: Jeffrey Zimmerman MRN: [id number] Date of Birth: Aug 26, 1935   Medicare Important Message Given:  Yes     Baruc Tugwell 08/05/2021, 1:03 PM

## 2021-08-05 NOTE — Progress Notes (Signed)
DISCHARGE NOTE HOME LYNK MARTI to be discharged Home per MD order. Discussed prescriptions and follow up appointments with the patient. Prescriptions given to patient; medication list explained in detail. Patient verbalized understanding.  Skin clean, dry and intact without evidence of skin break down, no evidence of skin tears noted. IV catheter discontinued intact. Site without signs and symptoms of complications. Dressing and pressure applied. Pt denies pain at the site currently. No complaints noted.  Patient free of lines, drains, and wounds.   An After Visit Summary (AVS) was printed and given to the patient. Patient escorted via wheelchair, and discharged home via private auto sent by [facility].  Berneta Levins, RN

## 2021-08-05 NOTE — Discharge Summary (Signed)
Physician Discharge Summary  Patient ID: Jeffrey Zimmerman MRN: [id number] DOB/AGE: 1935-09-18 86 y.o.  Admit date: 08/02/2021 Discharge date: 08/05/2021  Admission Diagnoses:  Discharge Diagnoses:  Principal Problem:   AMS (altered mental status) Active Problems:   Complicated UTI (urinary tract infection)   Discharged Condition: stable  Hospital Course: Jeffrey Zimmerman is a 86 y.o. male with medical history significant of advanced dementia, BPH, indwelling Foley catheter, HTN, recent oral BCC status post excision, CAD, was brought in for fever and mentation changes.   Patient very agitated unable to provide any history, I tried to reach patient's 2 contacts his wife and son, neither pick up the phone.  Could not leave message either.  All history collected by reviewing ED chart and talking to ED staff.  Patient was hospitalized about 3 weeks ago for episodes of Enterococcus fasciitis UTI and was discharged.  Today, family brought patient in reporting that " they can no longer taking her patient".  Appeared that outpatient family also mentioned patient mentation deteriorated and become more confused and agitated at home.  And patient was found low-grade fever in the ED 100.9.  According to ER chart, patient was noticed to have borderline low O2 saturation of 90% on room air when EMS picked him up and was placed on oxygen.  Acute metabolic encephalopathy -Secondary to infection -We will treat UTI and dehydration, overall improved -Resolved approximately 48 hours after hospitalization   SIRS -Secondary UTI, no grossly sepsis this point. -Initial urine culture inadequate.  Patient was on ampicillin.  This is being switched over to amoxicillin for 7 days.   Hypokalemia -IV and p.o. replacement, normalized   Hypomagnesemia -Received 2 g IV magnesium sulfate.  Normalized   Indwelling catheter -Foley exchanged in ED on day of admission   EKG -Source patient had QTC pulm occasional  auscultation, today's EKG showed normalized QTC.   HTN -Resume home meds  At this time of discharge patient's back to his baseline status confusion resolved.  Patient is a paced patient.  Have updated son on the phone.  However [facility] program is also working with son on patient being placed in assisted living due to his underlying dementia which is impeding his compliance with therapy as he lives alone.  His ex-wife used to help who is no longer able to help.  Patient medically stable for discharge pending disposition plan per case management team.  Discharge Exam: Blood pressure 135/85, pulse 92, temperature 97.8 F (36.6 C), temperature source Oral, resp. rate 16, height 5\' 8"  (1.727 m), weight 75.6 kg, SpO2 96 %. General appearance: alert and cooperative Resp: clear to auscultation bilaterally Cardio: regular rate and rhythm, S1, S2 normal, no murmur, click, rub or gallop GI: soft, non-tender; bowel sounds normal; no masses,  no organomegaly Extremities: extremities normal, atraumatic, no cyanosis or edema Pulses: 2+ and symmetric Skin: Skin color, texture, turgor normal. No rashes or lesions  Disposition: Assisted living pending  Discharge Instructions     Diet - low sodium heart healthy   Complete by: As directed    Discharge instructions   Complete by: As directed    Follow-up with [facility] in the next 1 to 2 days   Increase activity slowly   Complete by: As directed    No wound care   Complete by: As directed       Allergies as of 08/05/2021       Reactions   Codeine Other (See Comments)   "Doesn't help with  with pain control"        Medication List     STOP taking these medications    ciprofloxacin 500 MG tablet Commonly known as: CIPRO       TAKE these medications    acetaminophen 650 MG CR tablet Commonly known as: TYLENOL Take 650 mg by mouth every 8 (eight) hours as needed for pain.   amLODipine 5 MG tablet Commonly known as: NORVASC TAKE 1 TABLET BY  MOUTH DAILY   amoxicillin 500 MG tablet Commonly known as: AMOXIL Take 1 tablet (500 mg total) by mouth 2 (two) times daily.   AZO Cranberry Gummies 250 MG Chew Generic drug: Cranberry Chew 250 mg by mouth daily.   AZO CRANBERRY GUMMIES PO Take 1 tablet by mouth daily.   bisacodyl 10 MG suppository Commonly known as: DULCOLAX Place 10 mg rectally daily as needed for moderate constipation.   citalopram 10 MG tablet Commonly known as: CELEXA Take 10 mg by mouth daily.   clopidogrel 75 MG tablet Commonly known as: PLAVIX Take 1 tablet (75 mg total) by mouth daily.   dutasteride 0.5 MG capsule Commonly known as: AVODART Take 0.5 mg by mouth daily.   erythromycin ophthalmic ointment Place into the right eye every 6 (six) hours. What changed: You were already taking a medication with the same name, and this prescription was added. Make sure you understand how and when to take each.   erythromycin ophthalmic ointment Place 1 application into the right eye 4 (four) times daily for 7 days. What changed: additional instructions   Flonase Sensimist 27.5 MCG/SPRAY nasal spray Generic drug: fluticasone Place 1 spray into the nose daily.   lactase 3000 units tablet Commonly known as: LACTAID Take 3,000-6,000 Units by mouth 3 (three) times daily with meals.   lisinopril 20 MG tablet Commonly known as: ZESTRIL Take 1 tablet (20 mg total) by mouth daily.   magnesium hydroxide 400 MG/5ML suspension Commonly known as: MILK OF MAGNESIA Take 30 mLs by mouth daily as needed for mild constipation.   metoprolol tartrate 25 MG tablet Commonly known as: LOPRESSOR TAKE 1/2 TABLET BY MOUTH TWICE A DAY   nystatin ointment Commonly known as: MYCOSTATIN Apply 1 application topically 2 (two) times daily. What changed:  when to take this additional instructions   ondansetron 4 MG tablet Commonly known as: Zofran Take 1 tablet (4 mg total) by mouth every 8 (eight) hours as needed for  nausea or vomiting.   pravastatin 20 MG tablet Commonly known as: PRAVACHOL TAKE 1 TABLET (20 MG TOTAL) BY MOUTH DAILY. PLEASE SCHEDULE APPOINTMENT FOR FUTURE REFILLS. What changed: See the new instructions.   RA SALINE ENEMA RE Place 1 each rectally once. If no results with Biscodyl suppository   sennosides-docusate sodium 8.6-50 MG tablet Commonly known as: SENOKOT-S Take 2 tablets by mouth at bedtime.   tamsulosin 0.4 MG Caps capsule Commonly known as: FLOMAX Take 0.8 mg by mouth at bedtime.   tetrahydrozoline-zinc 0.05-0.25 % ophthalmic solution Commonly known as: VISINE-AC Place 1 drop into the right eye in the morning, at noon, in the evening, and at bedtime.   VISINE PURE TEARS OP Place 1 drop into the right eye 4 (four) times daily.   Vitamin D3 50 MCG (2000 UT) Tabs Take 2,000 Units by mouth in the morning.         Signed: Jackqulyn Mendel A 08/05/2021, 10:15 AM

## 2021-08-05 NOTE — TOC Progression Note (Signed)
Transition of Care [facility]) - Initial/Assessment Note    Patient Details  Name: Jeffrey Zimmerman MRN: [id number] Date of Birth: 02-Apr-1936  [facility] CM/SW Contact:    Milinda Antis, [place] Phone Number: 08/05/2021, 12:41 PM  Clinical Narrative:                 CSW sent referral to [facility] at the request of [facility] social worker.  The facility is reviewing the patient.         Patient Goals and CMS Choice        Expected Discharge Plan and Services           Expected Discharge Date: 08/05/21                                    Prior Living Arrangements/Services                       Activities of Daily Living      Permission Sought/Granted                  Emotional Assessment              Admission diagnosis:  Metabolic encephalopathy [I29.79] Dehydration [E86.0] Hypokalemia [E87.6] Hypomagnesemia [E83.42] Failure to thrive in adult [R62.7] PNA (pneumonia) [J18.9] Acute bacterial conjunctivitis of right eye [H10.31] Urinary tract infection associated with indwelling urethral catheter, initial encounter ([place]) [G92.119E, N39.0] AMS (altered mental status) [R41.82] Patient Active Problem List   Diagnosis Date Noted   Acute bacterial conjunctivitis of right eye    AMS (altered mental status) 17/40/8144   Complicated UTI (urinary tract infection) 07/08/2021   Hypokalemia 07/08/2021   Prolonged QT interval 07/08/2021   Alzheimer's dementia ([place]) 07/08/2021   Basal cell carcinoma (BCC) 06/23/2021   Bilateral lower extremity edema 04/10/2016   Old myocardial infarction 08/10/2013   Mixed hyperlipidemia 08/10/2013   Other malaise and fatigue 08/08/2013   Hypertension    Coronary artery disease    Cancer (HCC)    Basal cell carcinoma    Gout    Irritable bowel syndrome    SVT (supraventricular tachycardia) ([place])    PCP:  [name], MD Pharmacy:   [facility] [address] [place] [address] Phone: [phone] Fax: [phone]     Social Determinants of Health (SDOH) Interventions    Readmission Risk Interventions Readmission Risk Prevention Plan 08/04/2021  Transportation Screening Complete  Medication Review ([place]) Complete  PCP or Specialist appointment within 3-5 days of discharge Not Complete  PCP/Specialist Appt Not Complete comments [place] not medically ready for discharge.  [place] or Home Care Consult Complete  SW Recovery Care/Counseling Consult Complete  Palliative Care Screening Not Applicable  Skilled Nursing Facility Not Applicable  Some recent data might be hidden

## 2021-08-05 NOTE — Care Management Important Message (Signed)
Important Message  Patient Details  Name: Jeffrey Zimmerman MRN: [id number] Date of Birth: [birth year]   Medicare Important Message Given:  Yes     [name] 08/05/2021, 1:05 PM

## 2021-08-05 NOTE — Plan of Care (Signed)
  Problem: Education: Goal: Knowledge of General Education information will improve Description Including pain rating scale, medication(s)/side effects and non-pharmacologic comfort measures Outcome: Progressing   

## 2021-08-07 ENCOUNTER — Inpatient Hospital Stay (HOSPITAL_COMMUNITY)
Admission: EM | Admit: 2021-08-07 | Discharge: 2021-08-13 | DRG: 683 | Disposition: A | Discharge status: Skilled Nursing Facility | Payer: Medicare (Managed Care) | Attending: Family Medicine | Admitting: Family Medicine

## 2021-08-07 ENCOUNTER — Encounter (HOSPITAL_COMMUNITY): Payer: Self-pay | Admitting: Emergency Medicine

## 2021-08-07 ENCOUNTER — Emergency Department (HOSPITAL_COMMUNITY): Payer: Medicare (Managed Care)

## 2021-08-07 ENCOUNTER — Other Ambulatory Visit: Payer: Self-pay

## 2021-08-07 ENCOUNTER — Encounter: Payer: Medicare (Managed Care) | Admitting: Plastic Surgery

## 2021-08-07 DIAGNOSIS — I252 Old myocardial infarction: Secondary | ICD-10-CM

## 2021-08-07 DIAGNOSIS — Z20822 Contact with and (suspected) exposure to covid-19: Secondary | ICD-10-CM | POA: Diagnosis present

## 2021-08-07 DIAGNOSIS — G309 Alzheimer's disease, unspecified: Secondary | ICD-10-CM | POA: Diagnosis present

## 2021-08-07 DIAGNOSIS — E860 Dehydration: Secondary | ICD-10-CM | POA: Diagnosis present

## 2021-08-07 DIAGNOSIS — G510 Bell's palsy: Secondary | ICD-10-CM

## 2021-08-07 DIAGNOSIS — H269 Unspecified cataract: Secondary | ICD-10-CM

## 2021-08-07 DIAGNOSIS — Z9049 Acquired absence of other specified parts of digestive tract: Secondary | ICD-10-CM

## 2021-08-07 DIAGNOSIS — Z85038 Personal history of other malignant neoplasm of large intestine: Secondary | ICD-10-CM

## 2021-08-07 DIAGNOSIS — Z79899 Other long term (current) drug therapy: Secondary | ICD-10-CM

## 2021-08-07 DIAGNOSIS — E782 Mixed hyperlipidemia: Secondary | ICD-10-CM | POA: Diagnosis present

## 2021-08-07 DIAGNOSIS — H268 Other specified cataract: Secondary | ICD-10-CM | POA: Diagnosis present

## 2021-08-07 DIAGNOSIS — Z955 Presence of coronary angioplasty implant and graft: Secondary | ICD-10-CM

## 2021-08-07 DIAGNOSIS — Z8249 Family history of ischemic heart disease and other diseases of the circulatory system: Secondary | ICD-10-CM

## 2021-08-07 DIAGNOSIS — Z800 Family history of malignant neoplasm of digestive organs: Secondary | ICD-10-CM

## 2021-08-07 DIAGNOSIS — F02818 Dementia in other diseases classified elsewhere, unspecified severity, with other behavioral disturbance: Secondary | ICD-10-CM | POA: Diagnosis present

## 2021-08-07 DIAGNOSIS — I10 Essential (primary) hypertension: Secondary | ICD-10-CM | POA: Diagnosis present

## 2021-08-07 DIAGNOSIS — Z85828 Personal history of other malignant neoplasm of skin: Secondary | ICD-10-CM

## 2021-08-07 DIAGNOSIS — Z7902 Long term (current) use of antithrombotics/antiplatelets: Secondary | ICD-10-CM

## 2021-08-07 DIAGNOSIS — M109 Gout, unspecified: Secondary | ICD-10-CM | POA: Diagnosis present

## 2021-08-07 DIAGNOSIS — N3000 Acute cystitis without hematuria: Secondary | ICD-10-CM

## 2021-08-07 DIAGNOSIS — N400 Enlarged prostate without lower urinary tract symptoms: Secondary | ICD-10-CM | POA: Diagnosis present

## 2021-08-07 DIAGNOSIS — N179 Acute kidney failure, unspecified: Secondary | ICD-10-CM | POA: Diagnosis not present

## 2021-08-07 DIAGNOSIS — I2510 Atherosclerotic heart disease of native coronary artery without angina pectoris: Secondary | ICD-10-CM | POA: Diagnosis present

## 2021-08-07 DIAGNOSIS — E876 Hypokalemia: Secondary | ICD-10-CM | POA: Diagnosis present

## 2021-08-07 DIAGNOSIS — D72828 Other elevated white blood cell count: Secondary | ICD-10-CM | POA: Diagnosis present

## 2021-08-07 LAB — CBC
HCT: 42.9 % (ref 39.0–52.0)
Hemoglobin: 14.2 g/dL (ref 13.0–17.0)
MCH: 30 pg (ref 26.0–34.0)
MCHC: 33.1 g/dL (ref 30.0–36.0)
MCV: 90.5 fL (ref 80.0–100.0)
Platelets: 331 10*3/uL (ref 150–400)
RBC: 4.74 MIL/uL (ref 4.22–5.81)
RDW: 14.4 % (ref 11.5–15.5)
WBC: 12 10*3/uL — ABNORMAL HIGH (ref 4.0–10.5)
nRBC: 0 % (ref 0.0–0.2)

## 2021-08-07 LAB — TROPONIN I (HIGH SENSITIVITY)
Troponin I (High Sensitivity): 37 ng/L — ABNORMAL HIGH (ref <18)
Troponin I (High Sensitivity): 32 ng/L — ABNORMAL HIGH (ref <18)

## 2021-08-07 LAB — CULTURE, BLOOD (ROUTINE X 2)
Culture: NO GROWTH
Culture: NO GROWTH
Special Requests: ADEQUATE
Special Requests: ADEQUATE

## 2021-08-07 LAB — URINALYSIS, MICROSCOPIC (REFLEX)

## 2021-08-07 LAB — URINALYSIS, ROUTINE W REFLEX MICROSCOPIC
Glucose, UA: NEGATIVE mg/dL
Ketones, ur: 15 mg/dL — AB
Nitrite: NEGATIVE
Protein, ur: 100 mg/dL — AB
Specific Gravity, Urine: >1.03 — ABNORMAL HIGH (ref 1.005–1.030)
pH: 5.5 (ref 5.0–8.0)

## 2021-08-07 LAB — BASIC METABOLIC PANEL
Anion gap: 12 (ref 5–15)
BUN: 27 mg/dL — ABNORMAL HIGH (ref 8–23)
CO2: 25 mmol/L (ref 22–32)
Calcium: 9.3 mg/dL (ref 8.9–10.3)
Chloride: 106 mmol/L (ref 98–111)
Creatinine, Ser: 1.81 mg/dL — ABNORMAL HIGH (ref 0.61–1.24)
GFR, Estimated: 37 mL/min — ABNORMAL LOW (ref >60)
Glucose, Bld: 152 mg/dL — ABNORMAL HIGH (ref 70–99)
Potassium: 3.7 mmol/L (ref 3.5–5.1)
Sodium: 143 mmol/L (ref 135–145)

## 2021-08-07 LAB — DIFFERENTIAL
Abs Immature Granulocytes: 0.12 10*3/uL — ABNORMAL HIGH (ref 0.00–0.07)
Basophils Absolute: 0 10*3/uL (ref 0.0–0.1)
Basophils Relative: 0 %
Eosinophils Absolute: 0.1 10*3/uL (ref 0.0–0.5)
Eosinophils Relative: 1 %
Immature Granulocytes: 1 %
Lymphocytes Relative: 8 %
Lymphs Abs: 1 10*3/uL (ref 0.7–4.0)
Monocytes Absolute: 0.7 10*3/uL (ref 0.1–1.0)
Monocytes Relative: 6 %
Neutro Abs: 10 10*3/uL — ABNORMAL HIGH (ref 1.7–7.7)
Neutrophils Relative %: 84 %

## 2021-08-07 LAB — RESP PANEL BY RT-PCR (FLU A&B, COVID) ARPGX2
Influenza A by PCR: NEGATIVE
Influenza B by PCR: NEGATIVE
SARS Coronavirus 2 by RT PCR: NEGATIVE

## 2021-08-07 LAB — MAGNESIUM: Magnesium: 2 mg/dL (ref 1.7–2.4)

## 2021-08-07 MED ORDER — TAMSULOSIN HCL 0.4 MG PO CAPS
0.8000 mg | ORAL_CAPSULE | Freq: Every day | ORAL | Status: DC
  Administered 2021-08-07 – 2021-08-10 (×4): 0.8 mg via ORAL
  Filled 2021-08-07 (×4): qty 2

## 2021-08-07 MED ORDER — ACETAMINOPHEN 650 MG RE SUPP: 650.0000 mg | Freq: Four times a day (QID) | RECTAL | Status: DC | PRN

## 2021-08-07 MED ORDER — POLYETHYLENE GLYCOL 3350 17 G PO PACK: 17.0000 g | PACK | Freq: Every day | ORAL | Status: DC | PRN

## 2021-08-07 MED ORDER — AMOXICILLIN 500 MG PO CAPS
500.0000 mg | ORAL_CAPSULE | Freq: Two times a day (BID) | ORAL | Status: DC
Start: 2021-08-07 — End: 2021-08-07

## 2021-08-07 MED ORDER — CITALOPRAM HYDROBROMIDE 20 MG PO TABS
10.0000 mg | ORAL_TABLET | Freq: Every day | ORAL | Status: DC
  Administered 2021-08-08 – 2021-08-13 (×6): 10 mg via ORAL
  Filled 2021-08-07 (×6): qty 1

## 2021-08-07 MED ORDER — ERYTHROMYCIN 5 MG/GM OP OINT
1.0000 "application " | TOPICAL_OINTMENT | Freq: Four times a day (QID) | OPHTHALMIC | Status: AC
  Administered 2021-08-08 – 2021-08-12 (×19): 1 via OPHTHALMIC
  Filled 2021-08-07: qty 3.5

## 2021-08-07 MED ORDER — DUTASTERIDE 0.5 MG PO CAPS
0.5000 mg | ORAL_CAPSULE | Freq: Every day | ORAL | Status: DC
  Administered 2021-08-08 – 2021-08-13 (×6): 0.5 mg via ORAL
  Filled 2021-08-07 (×7): qty 1

## 2021-08-07 MED ORDER — SODIUM CHLORIDE 0.9 % IV BOLUS
500.0000 mL | Freq: Once | INTRAVENOUS | Status: AC
  Administered 2021-08-07: 500 mL via INTRAVENOUS

## 2021-08-07 MED ORDER — ONDANSETRON HCL 4 MG/2ML IJ SOLN: 4.0000 mg | Freq: Four times a day (QID) | INTRAMUSCULAR | Status: DC | PRN

## 2021-08-07 MED ORDER — ENOXAPARIN SODIUM 30 MG/0.3ML IJ SOSY
30.0000 mg | PREFILLED_SYRINGE | INTRAMUSCULAR | Status: DC
  Administered 2021-08-07 – 2021-08-12 (×6): 30 mg via SUBCUTANEOUS
  Filled 2021-08-07 (×6): qty 0.3

## 2021-08-07 MED ORDER — ONDANSETRON HCL 4 MG PO TABS: 4.0000 mg | ORAL_TABLET | Freq: Four times a day (QID) | ORAL | Status: DC | PRN

## 2021-08-07 MED ORDER — SODIUM CHLORIDE 0.9 % IV SOLN
1.0000 g | Freq: Once | INTRAVENOUS | Status: AC
  Administered 2021-08-07: 1 g via INTRAVENOUS
  Filled 2021-08-07: qty 10

## 2021-08-07 MED ORDER — SENNOSIDES-DOCUSATE SODIUM 8.6-50 MG PO TABS
2.0000 | ORAL_TABLET | Freq: Every day | ORAL | Status: DC
  Administered 2021-08-07 – 2021-08-12 (×6): 2 via ORAL
  Filled 2021-08-07 (×6): qty 2

## 2021-08-07 MED ORDER — AMOXICILLIN 500 MG PO CAPS
500.0000 mg | ORAL_CAPSULE | Freq: Two times a day (BID) | ORAL | Status: DC
  Administered 2021-08-08 – 2021-08-10 (×5): 500 mg via ORAL
  Filled 2021-08-07 (×6): qty 1

## 2021-08-07 MED ORDER — SENNOSIDES-DOCUSATE SODIUM 8.6-50 MG PO TABS: 1.0000 | ORAL_TABLET | Freq: Every evening | ORAL | Status: DC | PRN

## 2021-08-07 MED ORDER — AMLODIPINE BESYLATE 5 MG PO TABS: 5.0000 mg | ORAL_TABLET | Freq: Every day | ORAL | Status: DC

## 2021-08-07 MED ORDER — LACTASE 3000 UNITS PO TABS: 3000.0000 [IU] | ORAL_TABLET | Freq: Three times a day (TID) | ORAL | Status: DC | PRN

## 2021-08-07 MED ORDER — METOPROLOL TARTRATE 12.5 MG HALF TABLET
12.5000 mg | ORAL_TABLET | Freq: Two times a day (BID) | ORAL | Status: DC
  Administered 2021-08-07 – 2021-08-13 (×12): 12.5 mg via ORAL
  Filled 2021-08-07 (×12): qty 1

## 2021-08-07 MED ORDER — CLOPIDOGREL BISULFATE 75 MG PO TABS
75.0000 mg | ORAL_TABLET | Freq: Every day | ORAL | Status: DC
  Administered 2021-08-08 – 2021-08-11 (×4): 75 mg via ORAL
  Filled 2021-08-07 (×5): qty 1

## 2021-08-07 MED ORDER — ACETAMINOPHEN 325 MG PO TABS: 650.0000 mg | ORAL_TABLET | Freq: Four times a day (QID) | ORAL | Status: DC | PRN

## 2021-08-07 MED ORDER — LACTATED RINGERS IV SOLN: INTRAVENOUS | Status: AC

## 2021-08-07 MED ORDER — PRAVASTATIN SODIUM 10 MG PO TABS
20.0000 mg | ORAL_TABLET | Freq: Every day | ORAL | Status: DC
  Administered 2021-08-08 – 2021-08-13 (×6): 20 mg via ORAL
  Filled 2021-08-07 (×4): qty 2
  Filled 2021-08-07: qty 1
  Filled 2021-08-07: qty 2

## 2021-08-07 NOTE — Assessment & Plan Note (Addendum)
Chronic issue limiting ability to care for self at home and resulting in repeat admissions.  Also tried as an outpatient.  Likely needs assisted living facility placement. -Continue Celexa and delirium precautions -TOC consult

## 2021-08-07 NOTE — ED Notes (Signed)
Pt crawled off of end of bed. Pt minimally redirectable. Pt assisted back into bed. Fall bracelet placed on pt and posey alarm placed on on. Pt unable to verbalize need to stay in bed.

## 2021-08-07 NOTE — ED Provider Triage Note (Signed)
Emergency Medicine Provider Triage Evaluation Note  Jeffrey Zimmerman , a 86 y.o. male  was evaluated in triage.  Pt complains of CP. Was discharged two days, admitted for UTI with sepsis.   Review of Systems  Positive: CP Negative: fever  Physical Exam  BP 126/76 (BP Location: Right Arm)    Pulse (!) 110    Temp 97.7 F (36.5 C) (Oral)    Resp 18    SpO2 97%  Gen:   Awake, no distress   Resp:  Normal effort  MSK:   Moves extremities without difficulty  Other:  Tachycardic but regular rhythm, radial pulse 2+ equal bilaterally  Medical Decision Making  Medically screening exam initiated at 2:09 PM.  Appropriate orders placed.  [name] was informed that the remainder of the evaluation will be completed by another provider, this initial triage assessment does not replace that evaluation, and the importance of remaining in the ED until their evaluation is complete.  CP. Will check mag and UA given recent hospitalization    Sherrill Raring, Hershal Coria 08/07/21 1410

## 2021-08-07 NOTE — Assessment & Plan Note (Signed)
Continue pravastatin 

## 2021-08-07 NOTE — Assessment & Plan Note (Signed)
Continue amlodipine, Lopressor.  Hold lisinopril with AKI.

## 2021-08-07 NOTE — Assessment & Plan Note (Signed)
Patient reported chest pain to EMS, denies now.  Troponin minimally elevated and downtrending on repeat.  EKG without acute ischemic changes.  Has history of bare-metal stenting. -Continue Plavix and pravastatin

## 2021-08-07 NOTE — Assessment & Plan Note (Signed)
Had indwelling Foley catheter on prior admissions, Foley not present on arrival.  Continue tamsulosin and dutasteride.  Monitor urine output, bladder scan as needed.

## 2021-08-07 NOTE — Assessment & Plan Note (Signed)
Likely prerenal from poor oral intake/dehydration in setting of advanced dementia. -Continue IV fluid hydration overnight and repeat labs in a.m.

## 2021-08-07 NOTE — ED Notes (Signed)
Pt verbally aggressive and threatening staff

## 2021-08-07 NOTE — ED Triage Notes (Signed)
Patient BIB [facility] from home for evaluation of chest pain, cough, and nausea that started three days ago. Patient has history of dementia, is oriented x4 at this time.  EMS Vitals HR 110

## 2021-08-07 NOTE — ED Provider Notes (Signed)
[facility] EMERGENCY DEPARTMENT Provider Note   CSN: [id number] Arrival date & time: 08/07/21  1348     History  Chief Complaint  Patient presents with   Chest Pain    Jeffrey Zimmerman is a 86 y.o. male.  86 year old male with history of hypertension, MI, brought in by EMS from home.  Patient has dementia and denies any complaints at this time.  Per EMS, reported chest pain with cough and nausea for 3 days.  Also reports that patient was recently discharged from the hospital, was supposed to go to SNF however went home and wife is unable to care for the patient.      Home Medications Prior to Admission medications   Medication Sig Start Date End Date Taking? Authorizing Provider  acetaminophen (TYLENOL) 650 MG CR tablet Take 650 mg by mouth every 8 (eight) hours as needed for pain.    [provider]  amLODipine (NORVASC) 5 MG tablet TAKE 1 TABLET BY MOUTH DAILY Patient not taking: Reported on 08/03/2021 03/28/19   [name], MD  amoxicillin (AMOXIL) 500 MG capsule Take 1 capsule (500 mg total) by mouth 2 (two) times daily. 08/05/21   Phillips Grout, MD  AZO CRANBERRY GUMMIES PO Take 1 tablet by mouth daily.    [provider]  bisacodyl (DULCOLAX) 10 MG suppository Place 10 mg rectally daily as needed for moderate constipation. Patient not taking: Reported on 08/03/2021    [provider]  Cholecalciferol (VITAMIN D3) 50 MCG (2000 UT) TABS Take 2,000 Units by mouth in the morning.    [provider]  citalopram (CELEXA) 10 MG tablet Take 10 mg by mouth daily.    [provider]  clopidogrel (PLAVIX) 75 MG tablet Take 1 tablet (75 mg total) by mouth daily. Patient not taking: Reported on 08/03/2021 07/05/20   [name], MD  Cranberry (AZO CRANBERRY GUMMIES) 250 MG CHEW Chew 250 mg by mouth daily.    [provider]  dutasteride (AVODART) 0.5 MG capsule Take 0.5 mg by mouth daily.    [provider]  erythromycin ophthalmic ointment Place into the right eye every 6 (six) hours. 08/05/21   Phillips Grout, MD  erythromycin ophthalmic ointment Place 1 application into the right eye 4 (four) times daily for 7 days. 08/05/21 08/12/21  Phillips Grout, MD  fluticasone (FLONASE SENSIMIST) 27.5 MCG/SPRAY nasal spray Place 1 spray into the nose daily.    [provider]  Glycerin-Hypromellose-PEG 400 (VISINE PURE TEARS OP) Place 1 drop into the right eye 4 (four) times daily.    [provider]  lactase (LACTAID) 3000 units tablet Take 3,000-6,000 Units by mouth 3 (three) times daily with meals.    [provider]  lisinopril (ZESTRIL) 20 MG tablet Take 1 tablet (20 mg total) by mouth daily. 07/11/21   Mercy Riding, MD  magnesium hydroxide (MILK OF MAGNESIA) 400 MG/5ML suspension Take 30 mLs by mouth daily as needed for mild constipation. Patient not taking: Reported on 08/03/2021    [provider]  metoprolol tartrate (LOPRESSOR) 25 MG tablet TAKE 1/2 TABLET BY MOUTH TWICE A DAY Patient taking differently: Take 12.5 mg by mouth 2 (two) times daily. 10/09/20   Belva Crome, MD  nystatin ointment (MYCOSTATIN) Apply 1 application topically 2 (two) times daily. Patient taking differently: Apply 1 application topically See admin instructions. Apply to affected area 2 times a day 06/29/21   Addison Lank  Johnsie Cancel, MD  ondansetron (ZOFRAN) 4 MG tablet Take 1 tablet (4 mg total) by mouth every 8 (eight) hours as needed for nausea or vomiting. Patient not taking: Reported on 08/03/2021 06/23/21   Scheeler, Carola Rhine, PA-C  pravastatin (PRAVACHOL) 20 MG tablet TAKE 1 TABLET (20 MG TOTAL) BY MOUTH DAILY. PLEASE SCHEDULE APPOINTMENT FOR FUTURE REFILLS. Patient taking differently: Take 20 mg by mouth daily. 08/07/20   [name], MD  sennosides-docusate sodium (SENOKOT-S) 8.6-50 MG tablet Take 2 tablets by mouth at bedtime.    [provider]   Sodium Phosphates (RA SALINE ENEMA RE) Place 1 each rectally once. If no results with Biscodyl suppository Patient not taking: Reported on 08/03/2021    [provider]  tamsulosin (FLOMAX) 0.4 MG CAPS capsule Take 0.8 mg by mouth at bedtime. 11/19/19   [provider]  tetrahydrozoline-zinc (VISINE-AC) 0.05-0.25 % ophthalmic solution Place 1 drop into the right eye in the morning, at noon, in the evening, and at bedtime. Patient not taking: Reported on 08/03/2021    [provider]      Allergies    Codeine    Review of Systems   Review of Systems  Unable to perform ROS: Dementia  Respiratory:  Negative for shortness of breath.   Cardiovascular:  Negative for chest pain.  Gastrointestinal:  Negative for abdominal pain.   Physical Exam Updated Vital Signs BP 131/86    Pulse 80    Temp 97.7 F (36.5 C) (Oral)    Resp 19    SpO2 91%  Physical Exam Vitals and nursing note reviewed.  Constitutional:      General: He is not in acute distress.    Appearance: He is well-developed. He is not diaphoretic.  HENT:     Head: Normocephalic and atraumatic.  Cardiovascular:     Rate and Rhythm: Normal rate and regular rhythm.     Heart sounds: Normal heart sounds.  Pulmonary:     Effort: Pulmonary effort is normal.     Breath sounds: Normal breath sounds.  Chest:     Chest wall: No tenderness.  Abdominal:     Palpations: Abdomen is soft.     Tenderness: There is no abdominal tenderness.  Musculoskeletal:     Cervical back: Neck supple.     Right lower leg: No edema.     Left lower leg: No edema.  Neurological:     Mental Status: He is alert.     Motor: No weakness.     Comments: Oriented to person, date of birth, place  Psychiatric:        Behavior: Behavior normal.    ED Results / Procedures / Treatments   Labs (all labs ordered are listed, but only abnormal results are displayed) Labs Reviewed  BASIC METABOLIC PANEL - Abnormal; Notable for the  following components:      Result Value   Glucose, Bld 152 (*)    BUN 27 (*)    Creatinine, Ser 1.81 (*)    GFR, Estimated 37 (*)    All other components within normal limits  CBC - Abnormal; Notable for the following components:   WBC 12.0 (*)    All other components within normal limits  DIFFERENTIAL - Abnormal; Notable for the following components:   Neutro Abs 10.0 (*)    Abs Immature Granulocytes 0.12 (*)    All other components within normal limits  URINALYSIS, ROUTINE W REFLEX MICROSCOPIC - Abnormal; Notable for the following components:  Specific Gravity, Urine >1.030 (*)    Hgb urine dipstick MODERATE (*)    Bilirubin Urine MODERATE (*)    Ketones, ur 15 (*)    Protein, ur 100 (*)    Leukocytes,Ua TRACE (*)    All other components within normal limits  URINALYSIS, MICROSCOPIC (REFLEX) - Abnormal; Notable for the following components:   Bacteria, UA FEW (*)    All other components within normal limits  TROPONIN I (HIGH SENSITIVITY) - Abnormal; Notable for the following components:   Troponin I (High Sensitivity) 37 (*)    All other components within normal limits  TROPONIN I (HIGH SENSITIVITY) - Abnormal; Notable for the following components:   Troponin I (High Sensitivity) 32 (*)    All other components within normal limits  RESP PANEL BY RT-PCR (FLU A&B, COVID) [id number]  URINE CULTURE  MAGNESIUM    EKG None  Radiology DG Chest 2 View  Result Date: 08/07/2021 CLINICAL DATA:  Chest pain EXAM: CHEST - 2 VIEW COMPARISON:  08/03/2021 FINDINGS: The heart size and mediastinal contours are within normal limits. Both lungs are clear. Disc degenerative disease of the thoracic spine. IMPRESSION: No acute abnormality of the lungs. Electronically Signed   By: Delanna Ahmadi M.D.   On: 08/07/2021 14:42    Procedures Procedures    Medications Ordered in ED Medications  sodium chloride 0.9 % bolus 500 mL (has no administration in time range)  cefTRIAXone (ROCEPHIN) 1 g in  sodium chloride 0.9 % 100 mL IVPB (has no administration in time range)  sodium chloride 0.9 % bolus 500 mL (0 mLs Intravenous Stopped 08/07/21 1716)    ED Course/ Medical Decision Making/ A&P                           Medical Decision Making  This patient presents to the ED for concern of no complaints, this involves an extensive number of treatment options, and is a complaint that carries with it a high risk of complications and morbidity.  The differential diagnosis includes but not limited to dehydration, metabolic derangement    Co morbidities that complicate the patient evaluation  Dementia    Additional history obtained:  Additional history obtained from EMS report External records from outside source obtained and reviewed including recent discharge summary from 08/05/21 hospitalization    Lab Tests:  I Ordered, and personally interpreted labs.  The pertinent results include:  cbc, cmp (with AKI with Cr increase to 1.81, previously 0.78, troponin (elevated at 37),  UA  suggests UTI, culture reviewed from recent admission, multiple species, will send for culture and treat with Rocephin   Imaging Studies ordered:  I ordered imaging studies including CXR  I independently visualized and interpreted imaging which showed no acute findings I agree with the radiologist interpretation     Medicines ordered and prescription drug management:  I ordered medication including IV fluids for dehydration/AKI Reevaluation of the patient after these medicines showed that the patient stayed the same I have reviewed the patients home medicines and have made adjustments as needed  Critical Interventions:  IV fluids, straight cath for urinalysis.  Plan for condom cath placement for output monitoring   Consultations Obtained:  I requested consultation with the hospitalist,  and discussed lab and imaging findings as well as pertinent plan - they recommend: admission.   Problem List  / ED Course:  86 year old male brought in by EMS from home.  Per EMS, reported  chest pain however on arrival in emergency room has no complaints.  Patient is hard of hearing but is oriented to person and place.  Attempted to call patient's son for additional information however he did not answer the phone and does not have voicemail. On exam, lungs clear to auscultation, abdomen is soft and nontender. Labs reviewed, troponins are flat at 37 and 32.  No prior for comparison.  CBC with mildly elevated white blood cell count at 12,000 with slight increase in neutrophils.  COVID and flu negative, magnesium normal.  BMP does show acute kidney injury with creatinine of 1.8, given IV fluids.  Urinalysis suggest UTI.  Plan is to admit patient to the hospital for IV fluids and antibiotics.   Reevaluation:  After the interventions noted above, I reevaluated the patient and found that they have :stayed the same   Social Determinants of Health:  History of dementia, reviewed recent discharge summary, patient is living at home, was previously cared for by his ex-wife.  Upon discharge, he went home however there was discussion with the son at some point he would need placement.  It is not clear in the discharge summary the care arrangement at the time of discharge.   Dispostion:  After consideration of the diagnostic results and the patients response to treatment, I feel that the patent would benefit from admission.          Final Clinical Impression(s) / ED Diagnoses Final diagnoses:  AKI (acute kidney injury) ([place])  Acute cystitis without hematuria    Rx / DC Orders ED Discharge Orders     None         Roque Lias 08/07/21 1901    Carmin Muskrat, MD 08/08/21 1525

## 2021-08-07 NOTE — H&P (Signed)
History and Physical    Jeffrey Zimmerman EVO:[id number] DOB: Nov 14, 1935 DOA: 08/07/2021  PCP: [name], MD  Patient coming from: Home via EMS  I have personally briefly reviewed patient's old medical records in [place]  Chief Complaint: Cough, nausea  HPI: Jeffrey Zimmerman is a 86 y.o. male with medical history significant for advanced dementia with physical deconditioning, CAD s/p prior bare-metal stenting, HTN, or BCC s/p excision, hard of hearing who presented to the ED for evaluation of cough, nausea.  History limited from patient due to advanced dementia.  Patient recently admitted 08/02/2021-08/05/2021 for acute metabolic encephalopathy secondary to UTI and dehydration.  Urine culture grew multiple species.  Patient was previously on ampicillin for Enterococcus faecalis UTI and transitioned to amoxicillin on discharge for 7 days.  Patient has been active with the [facility] program.  He was ultimately discharged to home with plans to seek placement to assisted living facility on an outpatient basis due to his underlying dementia.  Patient returned to the ED from home via EMS for reported chest pain, cough, nausea.  Patient denies any symptoms during admission evaluation other than feeling fatigued and sleepy.  He is oriented to self, place, year but not situation.  He says he lives alone.  He is unable to provide any further relevant history.  Unable to reach son by phone.  ED Course:  Initial vitals showed BP 126/76, pulse 110, RR 18, temp 97.7 F, SPO2 97% on room air.  Labs show sodium 143, potassium 3.7, bicarb 25, BUN 27, creatinine 1.1 (previously 0.78 08/04/2021), WBC 12, hemoglobin 14.2, platelets 331,000, magnesium 2.0, troponin 37 > 32.  Urinalysis shows negative nitrites, trace leukocytes, 11-20 RBC/hpf, 21-50 WBC/hpf, few bacteria microscopy.  Urine culture ordered.  SARS-CoV-2 and influenza PCR are negative.  2 view chest x-ray negative for focal consolidation,  edema, effusion.  Patient was given 500 cc normal saline x 2 and IV ceftriaxone.  The hospitalist service was consulted to admit for further evaluation and management.  Review of Systems:  Unable to obtain full review of systems due to patient's advanced dementia.   Past Medical History:  Diagnosis Date   Adenomatous colon polyp 08/2008   Arthritis    Basal cell carcinoma    Cancer (HCC)    colon   Coronary artery disease    04/21/10 Anter MI with BMS to LAD with residual proximal LAD EF 40%   Gout    History of cholecystectomy    History of colonoscopy    1/5 AND 2/10   Hypertension    Irritable bowel syndrome    Myocardial infarct [facility])    SVT (supraventricular tachycardia) ([place])     Past Surgical History:  Procedure Laterality Date   ADJACENT TISSUE TRANSFER/TISSUE REARRANGEMENT Right 06/23/2021   Procedure: ADJACENT TISSUE TRANSFER/TISSUE REARRANGEMENT;  Surgeon: Cindra Presume, MD;  Location: [place];  Service: Plastics;  Laterality: Right;   APPENDECTOMY     CARDIAC CATHETERIZATION     04/21/10 Anter MI with BMS to LAD with residual proximal LAD EF 40%   CHOLECYSTECTOMY     pt does not remember when   [place] Right 06/23/2021   Procedure: Right cheek reconstruction;  Surgeon: Cindra Presume, MD;  Location: [place];  Service: Plastics;  Laterality: Right;   KNEE ARTHOSCOPY Bilateral    PAROTIDECTOMY N/A 06/23/2021   Procedure: PAROTIDECTOMY  RIGHT SUPERFICIAL WITH FACIAL NERVE DISSECTION;  Surgeon: Izora Gala, MD;  Location[facility]  OR;  Service: ENT;  Laterality: N/A;   PARTIAL LEFT NEPHRECTOMY     (for oncocytoma-benign)    Social History:  reports that he has never smoked. He has never used smokeless tobacco. He reports that he does not drink alcohol and does not use drugs.  Allergies  Allergen Reactions   Codeine Other (See Comments)    "Doesn't help with with pain control"    Family History  Problem Relation Age of Onset   Heart attack  Mother    CAD Mother    Colon cancer Father    Rheum arthritis Maternal Grandfather      Prior to Admission medications   Medication Sig Start Date End Date Taking? Authorizing Provider  acetaminophen (TYLENOL) 650 MG CR tablet Take 650 mg by mouth every 8 (eight) hours as needed for pain.    [provider]  amLODipine (NORVASC) 5 MG tablet TAKE 1 TABLET BY MOUTH DAILY Patient not taking: Reported on 08/03/2021 03/28/19   [name], MD  amoxicillin (AMOXIL) 500 MG capsule Take 1 capsule (500 mg total) by mouth 2 (two) times daily. 08/05/21   Phillips Grout, MD  AZO CRANBERRY GUMMIES PO Take 1 tablet by mouth daily.    [provider]  bisacodyl (DULCOLAX) 10 MG suppository Place 10 mg rectally daily as needed for moderate constipation. Patient not taking: Reported on 08/03/2021    [provider]  Cholecalciferol (VITAMIN D3) 50 MCG (2000 UT) TABS Take 2,000 Units by mouth in the morning.    [provider]  citalopram (CELEXA) 10 MG tablet Take 10 mg by mouth daily.    [provider]  clopidogrel (PLAVIX) 75 MG tablet Take 1 tablet (75 mg total) by mouth daily. Patient not taking: Reported on 08/03/2021 07/05/20   [name], MD  Cranberry (AZO CRANBERRY GUMMIES) 250 MG CHEW Chew 250 mg by mouth daily.    [provider]  dutasteride (AVODART) 0.5 MG capsule Take 0.5 mg by mouth daily.    [provider]  erythromycin ophthalmic ointment Place into the right eye every 6 (six) hours. 08/05/21   Phillips Grout, MD  erythromycin ophthalmic ointment Place 1 application into the right eye 4 (four) times daily for 7 days. 08/05/21 08/12/21  Phillips Grout, MD  fluticasone (FLONASE SENSIMIST) 27.5 MCG/SPRAY nasal spray Place 1 spray into the nose daily.    [provider]  Glycerin-Hypromellose-PEG 400 (VISINE PURE TEARS OP) Place 1 drop into the right eye 4 (four) times daily.    [provider]   lactase (LACTAID) 3000 units tablet Take 3,000-6,000 Units by mouth 3 (three) times daily with meals.    [provider]  lisinopril (ZESTRIL) 20 MG tablet Take 1 tablet (20 mg total) by mouth daily. 07/11/21   Mercy Riding, MD  magnesium hydroxide (MILK OF MAGNESIA) 400 MG/5ML suspension Take 30 mLs by mouth daily as needed for mild constipation. Patient not taking: Reported on 08/03/2021    [provider]  metoprolol tartrate (LOPRESSOR) 25 MG tablet TAKE 1/2 TABLET BY MOUTH TWICE A DAY Patient taking differently: Take 12.5 mg by mouth 2 (two) times daily. 10/09/20   Belva Crome, MD  nystatin ointment (MYCOSTATIN) Apply 1 application topically 2 (two) times daily. Patient taking differently: Apply 1 application topically See admin instructions. Apply to affected area 2 times a day 06/29/21   Fatima Blank, MD  ondansetron (ZOFRAN) 4 MG tablet Take 1 tablet (  4 mg total) by mouth every 8 (eight) hours as needed for nausea or vomiting. Patient not taking: Reported on 08/03/2021 06/23/21   Scheeler, Carola Rhine, PA-C  pravastatin (PRAVACHOL) 20 MG tablet TAKE 1 TABLET (20 MG TOTAL) BY MOUTH DAILY. PLEASE SCHEDULE APPOINTMENT FOR FUTURE REFILLS. Patient taking differently: Take 20 mg by mouth daily. 08/07/20   [name], MD  sennosides-docusate sodium (SENOKOT-S) 8.6-50 MG tablet Take 2 tablets by mouth at bedtime.    [provider]  Sodium Phosphates (RA SALINE ENEMA RE) Place 1 each rectally once. If no results with Biscodyl suppository Patient not taking: Reported on 08/03/2021    [provider]  tamsulosin (FLOMAX) 0.4 MG CAPS capsule Take 0.8 mg by mouth at bedtime. 11/19/19   [provider]  tetrahydrozoline-zinc (VISINE-AC) 0.05-0.25 % ophthalmic solution Place 1 drop into the right eye in the morning, at noon, in the evening, and at bedtime. Patient not taking: Reported on 08/03/2021    [provider]    Physical  Exam: Vitals:   08/07/21 1615 08/07/21 1700 08/07/21 1730 08/07/21 1830  BP: 135/62 (!) 136/92 135/78 131/86  Pulse: 82 91 84 80  Resp: 20 (!) 22 (!) 23 19  Temp:      TempSrc:      SpO2: 97% 95% 94% 91%   Constitutional: Chronically ill-appearing elderly man resting in bed  Eyes: PERRL ENMT: Mucous membranes are dry. Posterior pharynx clear of any exudate or lesions.hard of hearing. Bandage over right cheek at site of [facility] excision Neck: normal, supple, no masses. Respiratory: clear to auscultation bilaterally, no wheezing, no crackles. Normal respiratory effort. No accessory muscle use.  Cardiovascular: Regular rate and rhythm, no murmurs / rubs / gallops.  Trace bilateral lower extremity edema. Abdomen: no tenderness, no masses palpated. No hepatosplenomegaly.  Musculoskeletal: no clubbing / cyanosis. No joint deformity upper and lower extremities. No contractures. Normal muscle tone.  Skin: dry Neurologic: CN 2-12 grossly intact. Sensation intact. Strength 5/5 in all 4.  Psychiatric: Alert and oriented to self, place, year but not to situation.  Unable to provide further relevant history.   Labs on Admission: I have personally reviewed following labs and imaging studies  CBC: Recent Labs  Lab 08/02/21 1022 08/03/21 0327 08/04/21 0812 08/07/21 1409  WBC 9.3 11.1* 10.4 12.0*  NEUTROABS 7.8*  --  8.9* 10.0*  HGB 13.4 11.6* 11.5* 14.2  HCT 39.7 34.2* 35.1* 42.9  MCV 91.3 90.5 89.8 90.5  PLT 199 174 190 086   Basic Metabolic Panel: Recent Labs  Lab 08/02/21 1022 08/03/21 0327 08/04/21 0812 08/07/21 1409  NA 139 135 139 143  K 2.9* 3.2* 3.2* 3.7  CL 95* 103 102 106  CO2 26 24 23 25   GLUCOSE 112* 106* 105* 152*  BUN 16 10 9  27*  CREATININE 1.05 0.76 0.78 1.81*  CALCIUM 8.8* 7.7* 8.3* 9.3  MG 1.6* 1.8  --  2.0   GFR: Estimated Creatinine Clearance: 29.9 mL/min (A) (by C-G formula based on SCr of 1.81 mg/dL (H)). Liver Function Tests: Recent Labs  Lab  08/02/21 1022  AST 34  ALT 34  ALKPHOS 81  BILITOT 1.4*  PROT 6.8  ALBUMIN 3.2*   No results for input(s): LIPASE, AMYLASE in the last 168 hours. No results for input(s): AMMONIA in the last 168 hours. Coagulation Profile: No results for input(s): INR, PROTIME in the last 168 hours. Cardiac Enzymes: No results for input(s): CKTOTAL, CKMB, CKMBINDEX, TROPONINI in the last  168 hours. BNP (last 3 results) No results for input(s): PROBNP in the last 8760 hours. HbA1C: No results for input(s): HGBA1C in the last 72 hours. CBG: Recent Labs  Lab 08/02/21 1153  GLUCAP 100*   Lipid Profile: No results for input(s): CHOL, HDL, LDLCALC, TRIG, CHOLHDL, LDLDIRECT in the last 72 hours. Thyroid Function Tests: No results for input(s): TSH, T4TOTAL, FREET4, T3FREE, THYROIDAB in the last 72 hours. Anemia Panel: No results for input(s): VITAMINB12, FOLATE, FERRITIN, TIBC, IRON, RETICCTPCT in the last 72 hours. Urine analysis:    Component Value Date/Time   COLORURINE YELLOW 08/07/2021 [place] 08/07/2021 1715   LABSPEC >1.030 (H) 08/07/2021 1715   PHURINE 5.5 08/07/2021 1715   GLUCOSEU NEGATIVE 08/07/2021 1715   HGBUR MODERATE (A) 08/07/2021 1715   BILIRUBINUR MODERATE (A) 08/07/2021 1715   KETONESUR 15 (A) 08/07/2021 1715   PROTEINUR 100 (A) 08/07/2021 1715   NITRITE NEGATIVE 08/07/2021 1715   LEUKOCYTESUR TRACE (A) 08/07/2021 1715    Radiological Exams on Admission: DG Chest 2 View  Result Date: 08/07/2021 CLINICAL DATA:  Chest pain EXAM: CHEST - 2 VIEW COMPARISON:  08/03/2021 FINDINGS: The heart size and mediastinal contours are within normal limits. Both lungs are clear. Disc degenerative disease of the thoracic spine. IMPRESSION: No acute abnormality of the lungs. Electronically Signed   By: Delanna Ahmadi M.D.   On: 08/07/2021 14:42    EKG: Personally reviewed. Sinus tachycardia, rate 111, no acute ischemic changes.  Rate is faster when compared to  prior.  Assessment/Plan Principal Problem:   AKI (acute kidney injury) ([place]) Active Problems:   Alzheimer's dementia with behavioral disturbance ([place])   Hypertension   Coronary artery disease   Mixed hyperlipidemia   BPH (benign prostatic hyperplasia)   Jeffrey Zimmerman is a 86 y.o. male with medical history significant for advanced dementia with physical deconditioning, CAD s/p prior bare-metal stenting, HTN, or BCC s/p excision right cheek, hard of hearing who is admitted with AKI.  * AKI (acute kidney injury) ([place])- (present on admission) Likely prerenal from poor oral intake/dehydration in setting of advanced dementia. -Continue IV fluid hydration overnight and repeat labs in a.m.  Alzheimer's dementia with behavioral disturbance ([place])- (present on admission) Chronic issue limiting ability to care for self at home and resulting in repeat admissions.  Also tried as an outpatient.  Likely needs assisted living facility placement. -Continue Celexa and delirium precautions -TOC consult  BPH (benign prostatic hyperplasia)- (present on admission) Had indwelling Foley catheter on prior admissions, Foley not present on arrival.  Continue tamsulosin and dutasteride.  Monitor urine output, bladder scan as needed.  Mixed hyperlipidemia- (present on admission) Continue pravastatin.  Coronary artery disease- (present on admission) Patient reported chest pain to EMS, denies now.  Troponin minimally elevated and downtrending on repeat.  EKG without acute ischemic changes.  Has history of bare-metal stenting. -Continue Plavix and pravastatin  Hypertension- (present on admission) Continue amlodipine, Lopressor.  Hold lisinopril with AKI.  Recent UTI: Continue oral amoxicillin.  DVT prophylaxis: enoxaparin (LOVENOX) injection 30 mg Start: 08/07/21 2200 Code Status: Full code Family Communication: None available on admission Disposition Plan: From home, likely needs placement on  discharge Consults called: None Level of care: Med-Surg Admission status:   Status is: Observation  The patient remains OBS appropriate and will d/c before 2 midnights.  Zada Finders MD [facility]  If 7PM-7AM, please contact night-coverage [email or web address]  08/07/2021, 8:05 PM

## 2021-08-08 ENCOUNTER — Observation Stay (HOSPITAL_COMMUNITY): Payer: Medicare (Managed Care)

## 2021-08-08 ENCOUNTER — Encounter (HOSPITAL_COMMUNITY): Payer: Self-pay | Admitting: Internal Medicine

## 2021-08-08 DIAGNOSIS — R29810 Facial weakness: Secondary | ICD-10-CM | POA: Diagnosis not present

## 2021-08-08 DIAGNOSIS — N179 Acute kidney failure, unspecified: Secondary | ICD-10-CM | POA: Diagnosis not present

## 2021-08-08 LAB — BASIC METABOLIC PANEL
Anion gap: 12 (ref 5–15)
BUN: 24 mg/dL — ABNORMAL HIGH (ref 8–23)
CO2: 24 mmol/L (ref 22–32)
Calcium: 8.1 mg/dL — ABNORMAL LOW (ref 8.9–10.3)
Chloride: 107 mmol/L (ref 98–111)
Creatinine, Ser: 0.96 mg/dL (ref 0.61–1.24)
GFR, Estimated: >60 mL/min (ref >60)
Glucose, Bld: 82 mg/dL (ref 70–99)
Potassium: 3.1 mmol/L — ABNORMAL LOW (ref 3.5–5.1)
Sodium: 143 mmol/L (ref 135–145)

## 2021-08-08 LAB — CBC
HCT: 35.3 % — ABNORMAL LOW (ref 39.0–52.0)
Hemoglobin: 11.2 g/dL — ABNORMAL LOW (ref 13.0–17.0)
MCH: 29.9 pg (ref 26.0–34.0)
MCHC: 31.7 g/dL (ref 30.0–36.0)
MCV: 94.4 fL (ref 80.0–100.0)
Platelets: 188 10*3/uL (ref 150–400)
RBC: 3.74 MIL/uL — ABNORMAL LOW (ref 4.22–5.81)
RDW: 14.4 % (ref 11.5–15.5)
WBC: 6.6 10*3/uL (ref 4.0–10.5)
nRBC: 0 % (ref 0.0–0.2)

## 2021-08-08 LAB — GLUCOSE, CAPILLARY: Glucose-Capillary: 99 mg/dL (ref 70–99)

## 2021-08-08 MED ORDER — AMLODIPINE BESYLATE 5 MG PO TABS
5.0000 mg | ORAL_TABLET | Freq: Every day | ORAL | Status: DC
  Administered 2021-08-08 – 2021-08-13 (×6): 5 mg via ORAL
  Filled 2021-08-08 (×6): qty 1

## 2021-08-08 MED ORDER — POTASSIUM CHLORIDE CRYS ER 20 MEQ PO TBCR
40.0000 meq | EXTENDED_RELEASE_TABLET | ORAL | Status: AC
  Administered 2021-08-08 (×2): 40 meq via ORAL
  Filled 2021-08-08 (×2): qty 2

## 2021-08-08 MED ORDER — POTASSIUM CHLORIDE CRYS ER 20 MEQ PO TBCR
40.0000 meq | EXTENDED_RELEASE_TABLET | ORAL | Status: AC
  Filled 2021-08-08: qty 2

## 2021-08-08 MED ORDER — SODIUM CHLORIDE 0.9 % IV SOLN: INTRAVENOUS | Status: DC

## 2021-08-08 NOTE — Consult Note (Signed)
Neurology Consultation Reason for Consult: Facial weakness Referring Physician: Code stroke  CC: Facial weakness  History is obtained from: Chart review  HPI: Jeffrey Zimmerman is a 86 y.o. male with a history of facial squamous cell carcinoma recently having undergone parotidectomy with facial nerve dissection who was noted to have right facial weakness today.  He has also been somewhat hypertensive.  The nurse helping transport him to CT had actually seen him earlier in the evening and helped him with medications, and was quite confident that he had not had any right facial weakness earlier tonight.  Though the facial weakness is definitely new, he has had some confusion over the course of the hospitalization.  LKW: Unclear, given confusion tpa given?: no, unclear time of onset    Past Medical History:  Diagnosis Date   Adenomatous colon polyp 08/2008   Arthritis    Basal cell carcinoma    Cancer (HCC)    colon   Coronary artery disease    04/21/10 Anter MI with BMS to LAD with residual proximal LAD EF 40%   Gout    History of cholecystectomy    History of colonoscopy    1/5 AND 2/10   Hypertension    Irritable bowel syndrome    Myocardial infarct (HCC)    SVT (supraventricular tachycardia) (HCC)      Family History  Problem Relation Age of Onset   Heart attack Mother    CAD Mother    Colon cancer Father    Rheum arthritis Maternal Grandfather      Social History:  reports that he has never smoked. He has never used smokeless tobacco. He reports that he does not drink alcohol and does not use drugs.   Exam: Current vital signs: BP (!) 170/87 (BP Location: Left Arm)    Pulse 64    Temp 97.7 F (36.5 C) (Oral)    Resp 20    SpO2 97%  Vital signs in last 24 hours: Temp:  [97.7 F (36.5 C)-98.3 F (36.8 C)] 97.7 F (36.5 C) (01/20 2027) Pulse Rate:  [60-85] 64 (01/20 2027) Resp:  [14-22] 20 (01/20 2027) BP: (133-170)/(67-91) 170/87 (01/20 2027) SpO2:  [93 %-100  %] 97 % (01/20 2027)   Physical Exam  Constitutional: Appears well-developed and well-nourished.  Psych: Affect appropriate to situation Eyes: No scleral injection HENT: Healing right facial reconstruction MSK: no joint deformities.  Cardiovascular: Normal rate and regular rhythm.  Respiratory: Effort normal, non-labored breathing GI: Soft.  No distension. There is no tenderness.  Skin: WDI  Neuro: Mental Status: Patient is awake, alert, oriented to person, month, age He occasionally uses the wrong word, for instance when trying to say he is 25 he says 24, and then corrects himself. Cranial Nerves: II: Visual Fields are full. Pupils are equal, round, and reactive to light.   III,IV, VI: EOMI without ptosis or diploplia.  V: Facial sensation is symmetric to temperature VII: Facial movement with both upper and lower facial weakness VIII: hearingdecreased bilaterally X: Uvula elevates symmetrically XI: Shoulder shrug is symmetric. XII: tongue is midline without atrophy or fasciculations.  Motor: Tone is normal. Bulk is normal. 5/5 strength was present in all four extremities.  Sensory: Sensation is symmetric to light touch and temperature in the arms and legs. Cerebellar: He has mild tremor without definite ataxia     I have reviewed labs in epic and the results pertinent to this consultation are: Creatinine 0.96  I have reviewed the images  obtained: CT head-negative  Impression: 86 year old male with what looks like a peripheral facial palsy.  Given that he has been hypertensive, and the relatively sudden onset, I do think that imaging with MRI would be prudent.  If this is negative, then I suspect it is likely due to his recent surgery and would consider involvement of his facial surgeons.  An idiopathic Bell's palsy could be a consideration and if this is felt to be likely by his surgeons, steroids would be a consideration.  In an 86 year old with dementia, there could be  some risk of steroid associated delirium.  Recommendations: 1) MRI brain with and without contrast 2) if no intracranial abnormality, consider discussion with his facial surgeons on need for steroids.   Roland Rack, MD [facility] [phone]  If 7pm- 7am, please page neurology on call as listed in [place].

## 2021-08-08 NOTE — NC FL2 (Signed)
[place] LEVEL OF CARE SCREENING TOOL     IDENTIFICATION  Patient Name: Jeffrey Zimmerman Birthdate: September 05, 1935 Sex: male Admission Date (Current Location): 08/07/2021  [facility] [place] Number:  Herbalist and Address:  The . [facility], [place] [address] [place], [place] [address]      Provider Number: [id number]  Attending Physician Name and Address:  Darliss Cheney, MD  Relative Name and Phone Number:  Mickle Plumb [phone])   [phone]    Current Level of Care: Hospital Recommended Level of Care: [place] Prior Approval Number:    Date Approved/Denied:   PASRR Number: [id number]  Discharge Plan: SNF    Current Diagnoses: Patient Active Problem List   Diagnosis Date Noted   AKI (acute kidney injury) ([place]) 08/07/2021   BPH (benign prostatic hyperplasia) 08/07/2021   Acute bacterial conjunctivitis of right eye    AMS (altered mental status) 81/27/5170   Complicated UTI (urinary tract infection) 07/08/2021   Hypokalemia 07/08/2021   Alzheimer's dementia with behavioral disturbance ([place]) 07/08/2021   Basal cell carcinoma (BCC) 06/23/2021   Bilateral lower extremity edema 04/10/2016   Old myocardial infarction 08/10/2013   Mixed hyperlipidemia 08/10/2013   Other malaise and fatigue 08/08/2013   Hypertension    Coronary artery disease    Cancer (HCC)    Basal cell carcinoma    Gout    Irritable bowel syndrome    SVT (supraventricular tachycardia) ([place])     Orientation RESPIRATION BLADDER Height & Weight     Self  Normal Continent Weight:   Height:     BEHAVIORAL SYMPTOMS/MOOD NEUROLOGICAL BOWEL NUTRITION STATUS      Incontinent Diet  AMBULATORY STATUS COMMUNICATION OF NEEDS Skin   Supervision Verbally Normal                       Personal Care Assistance Level of Assistance  Bathing, Feeding, Dressing Bathing Assistance: Limited assistance Feeding assistance: Limited assistance Dressing  Assistance: Limited assistance     Functional Limitations Info  Sight, Hearing, Speech Sight Info: Adequate Hearing Info: Impaired Speech Info: Adequate    SPECIAL CARE FACTORS FREQUENCY                       Contractures Contractures Info: Not present    Additional Factors Info  Code Status, Allergies Code Status Info: Full Allergies Info: Codeine           Current Medications (08/08/2021):  This is the current hospital active medication list Current Facility-Administered Medications  Medication Dose Route Frequency Provider Last Rate Last Admin   0.9 %  sodium chloride infusion   Intravenous Continuous Pahwani, Ravi, MD       acetaminophen (TYLENOL) tablet 650 mg  650 mg Oral Q6H PRN Lenore Cordia, MD       Or   acetaminophen (TYLENOL) suppository 650 mg  650 mg Rectal Q6H PRN Lenore Cordia, MD       [START ON 08/09/2021] amLODipine (NORVASC) tablet 5 mg  5 mg Oral Daily Zada Finders R, MD       amoxicillin (AMOXIL) capsule 500 mg  500 mg Oral BID Zada Finders R, MD   500 mg at 08/08/21 1440   citalopram (CELEXA) tablet 10 mg  10 mg Oral Daily Zada Finders R, MD   10 mg at 08/08/21 1441   clopidogrel (PLAVIX) tablet 75 mg  75 mg Oral Daily Posey Pronto,  Vishal R, MD       dutasteride (AVODART) capsule 0.5 mg  0.5 mg Oral Daily Zada Finders R, MD   0.5 mg at 08/08/21 1443   enoxaparin (LOVENOX) injection 30 mg  30 mg Subcutaneous Q24H Zada Finders R, MD   30 mg at 08/07/21 2155   erythromycin ophthalmic ointment 1 application  1 application Right Eye QID Lenore Cordia, MD   1 application at 68/15/94 1443   lactase (LACTAID) tablet 3,000-6,000 Units  3,000-6,000 Units Oral TID PRN Lenore Cordia, MD       metoprolol tartrate (LOPRESSOR) tablet 12.5 mg  12.5 mg Oral BID Zada Finders R, MD   12.5 mg at 08/08/21 1234   ondansetron (ZOFRAN) tablet 4 mg  4 mg Oral Q6H PRN Lenore Cordia, MD       Or   ondansetron (ZOFRAN) injection 4 mg  4 mg Intravenous Q6H PRN  Lenore Cordia, MD       polyethylene glycol (MIRALAX / GLYCOLAX) packet 17 g  17 g Oral Daily PRN Zada Finders R, MD       pravastatin (PRAVACHOL) tablet 20 mg  20 mg Oral Daily Zada Finders R, MD   20 mg at 08/08/21 1235   senna-docusate (Senokot-S) tablet 2 tablet  2 tablet Oral QHS Lenore Cordia, MD   2 tablet at 08/07/21 2152   tamsulosin (FLOMAX) capsule 0.8 mg  0.8 mg Oral QHS Lenore Cordia, MD   0.8 mg at 08/07/21 2152     Discharge Medications: Please see discharge summary for a list of discharge medications.  Relevant Imaging Results:  Relevant Lab Results:   Additional Information SSN [id number]; From [facility]  Devinn Voshell F Jaylee Lantry, LCSWA

## 2021-08-08 NOTE — Code Documentation (Signed)
Responded to Code Stroke called at 2058 for R side facial paralysis and aphasia, LSN-unclear. CBG-99, NIH-5(total paralysis of R face and aphasia), CT head negative for acute changes. TNK not given-unclear LSN. Please keep pt NPO until he passes yale swallow screen. Q2h VS/neuro checks x 12 hours, then q4h ordered. Plan for MRI.

## 2021-08-08 NOTE — Plan of Care (Signed)
  Problem: Education: Goal: Knowledge of General Education information will improve Description: Including pain rating scale, medication(s)/side effects and non-pharmacologic comfort measures Outcome: Progressing   Problem: Clinical Measurements: Goal: Respiratory complications will improve Outcome: Progressing   Problem: Coping: Goal: Level of anxiety will decrease Outcome: Progressing   

## 2021-08-08 NOTE — TOC Progression Note (Addendum)
Transition of Care [facility]) - Initial/Assessment Note    Patient Details  Name: Jeffrey Zimmerman MRN: [id number] Date of Birth: 10-20-1935  Transition of Care [facility]) CM/SW Contact:    Milinda Antis, [place] Phone Number: 08/08/2021, 3:10 PM  Clinical Narrative:                 CSW attempted to contact [facility] SW Orlene Erm [phone]). There was no answer.  CSW left a VM requesting a returned call informing the social worker that PT and OT are recommending SNF and [facility]'s approval is needed.    16:15-  CSW received a returned call from [place] with [facility].  ([phone])  The agency has approved SNF placement and requested that CSW send the referral to [facility] and [facility].  CSW completed FL2 and faxed to the above agencies.   CSW attempted to call the patient's son.       (The patient is only oriented to person and the chart shows that the patient's wife does not speak english.)  There was no answer.  CSW left a VM requesting a returned call to discuss d/c planning.       Patient Goals and CMS Choice        Expected Discharge Plan and Services                                                Prior Living Arrangements/Services                       Activities of Daily Living      Permission Sought/Granted                  Emotional Assessment              Admission diagnosis:  Acute cystitis without hematuria [N30.00] AKI (acute kidney injury) ([place]) [N17.9] Acute kidney injury ([place]) [N17.9] Patient Active Problem List   Diagnosis Date Noted   AKI (acute kidney injury) ([place]) 08/07/2021   BPH (benign prostatic hyperplasia) 08/07/2021   Acute bacterial conjunctivitis of right eye    AMS (altered mental status) 00/93/8182   Complicated UTI (urinary tract infection) 07/08/2021   Hypokalemia 07/08/2021   Alzheimer's dementia with behavioral disturbance ([place]) 07/08/2021   Basal cell carcinoma (BCC) 06/23/2021    Bilateral lower extremity edema 04/10/2016   Old myocardial infarction 08/10/2013   Mixed hyperlipidemia 08/10/2013   Other malaise and fatigue 08/08/2013   Hypertension    Coronary artery disease    Cancer (HCC)    Basal cell carcinoma    Gout    Irritable bowel syndrome    SVT (supraventricular tachycardia) ([place])    PCP:  [name], MD Pharmacy:   [facility] [address] [place] [address] Phone: [phone] Fax: [phone]     Social Determinants of Health (SDOH) Interventions    Readmission Risk Interventions Readmission Risk Prevention Plan 08/04/2021  Transportation Screening Complete  Medication Review ([place]) Complete  PCP or Specialist appointment within 3-5 days of discharge Not Complete  PCP/Specialist Appt Not Complete comments [place] not medically ready for discharge.  [place] or Home Care Consult Complete  SW Recovery Care/Counseling Consult Complete  Palliative Care Screening Not [place] Not  Applicable  Some recent data might be hidden

## 2021-08-08 NOTE — Evaluation (Signed)
Occupational Therapy Evaluation Patient Details Name: Jeffrey Zimmerman MRN: [id number] DOB: [birth year] Today's Date: 08/08/2021   History of Present Illness Pt is a 86 y/o male presenting on 08/07/21 with cough, nausea. Recent admission 1/14-1/17 with AMS due to UTI/dehydration. Found with AKI. PMH includes: advanced dementia with behavioral disturbance, CAD, HTN, hard of hearing, gout, IBS, MI, colon cancer.   Clinical Impression   Patient admitted for above and limited by problem list below, including impaired balance, generalized weakness, decreased activity tolerance.  He reports PTA was independent with ADLs, using RW for mobility and living alone (reports spouse lives in another home?)--pt questionable historian due to dementia. Today, he requires mod assist +2 for sit to stand and side stepping towards HOB, up to max assist for LB ADLs.  He is oriented and following commands, but presents with decreased problem solving, awareness, attention (and known decreased recall). Based on performance today, believe pt will benefit from continued OT services while admitted and after dc at SNF level to optimize independence, safety with mobility and ADLs to decrease burden of care.  Ultimately due to dementia, will need 24/7 support at dc and likely long term care after SNF. Will follow.      Recommendations for follow up therapy are one component of a multi-disciplinary discharge planning process, led by the attending physician.  Recommendations may be updated based on patient status, additional functional criteria and insurance authorization.   Follow Up Recommendations  Skilled nursing-short term rehab (<3 hours/day)    Assistance Recommended at Discharge Frequent or constant Supervision/Assistance  Patient can return home with the following A lot of help with walking and/or transfers;A lot of help with bathing/dressing/bathroom;Direct supervision/assist for medications management;Direct  supervision/assist for financial management;Assist for transportation;Help with stairs or ramp for entrance;Assistance with cooking/housework    Functional Status Assessment  Patient has had a recent decline in their functional status and demonstrates the ability to make significant improvements in function in a reasonable and predictable amount of time.  Equipment Recommendations  Other (comment) (TBD)    Recommendations for Other Services       Precautions / Restrictions Precautions Precautions: Fall Restrictions Weight Bearing Restrictions: No      Mobility Bed Mobility Overal bed mobility: Needs Assistance Bed Mobility: Supine to Sit, Sit to Supine     Supine to sit: Min assist Sit to supine: Min assist   General bed mobility comments: min assist for trunk support to ascend and BLEs fully back to supine    Transfers Overall transfer level: Needs assistance Equipment used: 2 person hand held assist Transfers: Sit to/from Stand Sit to Stand: Mod assist, +2 physical assistance, +2 safety/equipment           General transfer comment: to power up and steady      Balance Overall balance assessment: Needs assistance Sitting-balance support: No upper extremity supported, Feet supported Sitting balance-Leahy Scale: Fair Sitting balance - Comments: limited dynamically with posterior lean during LB ADLs   Standing balance support: Bilateral upper extremity supported, During functional activity Standing balance-Leahy Scale: Poor Standing balance comment: relies on external support dynaimcally, able to stand statically with 1 UE support                           ADL either performed or assessed with clinical judgement   ADL Overall ADL's : Needs assistance/impaired Eating/Feeding: Set up;Sitting   Grooming: Minimal assistance;Sitting  Upper Body Dressing : Minimal assistance;Sitting   Lower Body Dressing: Sit to/from stand;Maximal  assistance;+2 for physical assistance;+2 for safety/equipment Lower Body Dressing Details (indicate cue type and reason): requires assist for socks, mod assist +2 to stand Toilet Transfer: Moderate assistance;+2 for physical assistance;+2 for safety/equipment Toilet Transfer Details (indicate cue type and reason): side stepping towards [facility] Toileting- Clothing Manipulation and Hygiene: Total assistance;+2 for safety/equipment;+2 for physical assistance;Sit to/from stand       Functional mobility during ADLs: Moderate assistance;+2 for physical assistance;+2 for safety/equipment;Cueing for safety General ADL Comments: pt     Vision   Vision Assessment?: No apparent visual deficits     Perception     Praxis      Pertinent Vitals/Pain Pain Assessment Pain Assessment: No/denies pain     Hand Dominance Right   Extremity/Trunk Assessment Upper Extremity Assessment Upper Extremity Assessment: Generalized weakness   Lower Extremity Assessment Lower Extremity Assessment: Defer to PT evaluation       Communication Communication Communication: HOH   Cognition Arousal/Alertness: Awake/alert Behavior During Therapy: WFL for tasks assessed/performed Overall Cognitive Status: History of cognitive impairments - at baseline                                 General Comments: pt very HOH.  Oriented and following simple commands, poor problem solving, awareness, recall and attention noted during session.     General Comments  VSS on RA    Exercises     Shoulder Instructions      Home Living Family/patient expects to be discharged to:: Private residence Living Arrangements: Spouse/significant other Available Help at Discharge: Family;Available PRN/intermittently Type of Home: House Home Access: Level entry     Home Layout: One level     Bathroom Shower/Tub: Teacher, early years/pre: Standard     Home Equipment: Conservation officer, nature (2 wheels)    Additional Comments: questionable historian      Prior Functioning/Environment Prior Level of Function : Independent/Modified Independent;Patient poor historian/Family not available             Mobility Comments: Uses RW for ambulation ADLs Comments: Pt has assist with cooking/cleaning, and medicine management. Reports independent ADLs.        OT Problem List: Decreased strength;Decreased activity tolerance;Impaired balance (sitting and/or standing);Decreased coordination;Decreased cognition;Decreased safety awareness;Decreased knowledge of precautions;Decreased knowledge of use of DME or AE      OT Treatment/Interventions: Self-care/ADL training;Therapeutic exercise;DME and/or AE instruction;Therapeutic activities;Patient/family education;Balance training    OT Goals(Current goals can be found in the care plan section) Acute Rehab OT Goals Patient Stated Goal: feel better OT Goal Formulation: With patient Time For Goal Achievement: 08/22/21 Potential to Achieve Goals: Good  OT Frequency: Min 2X/week    Co-evaluation PT/OT/SLP Co-Evaluation/Treatment: Yes Reason for Co-Treatment: For patient/therapist safety;To address functional/ADL transfers PT goals addressed during session: Mobility/safety with mobility;Balance OT goals addressed during session: ADL's and self-care      AM-PAC OT "6 Clicks" Daily Activity     Outcome Measure Help from another person eating meals?: A Little Help from another person taking care of personal grooming?: A Little Help from another person toileting, which includes using toliet, bedpan, or urinal?: Total Help from another person bathing (including washing, rinsing, drying)?: A Lot Help from another person to put on and taking off regular upper body clothing?: A Little Help from another person to put on and taking off regular lower  body clothing?: A Lot 6 Click Score: 14   End of Session Equipment Utilized During Treatment: Gait belt Nurse  Communication: Mobility status  Activity Tolerance: Patient tolerated treatment well Patient left: in bed;with call bell/phone within reach;with bed alarm set  OT Visit Diagnosis: Other abnormalities of gait and mobility (R26.89);Muscle weakness (generalized) (M62.81);Other symptoms and signs involving cognitive function                Time: 1036-1100 OT Time Calculation (min): 24 min Charges:  OT General Charges $OT Visit: 1 Visit OT Evaluation $OT Eval Moderate Complexity: 1 Mod  Jolaine Artist, OT Acute Rehabilitation Services Pager [phone] Office [phone]   Delight Stare 08/08/2021, 11:59 AM

## 2021-08-08 NOTE — Evaluation (Signed)
Physical Therapy Evaluation Patient Details Name: Jeffrey Zimmerman MRN: [id number] DOB: 26-Feb-1936 Today's Date: 08/08/2021  History of Present Illness  Pt is a 86 y/o male presenting on 08/07/21 with cough, nausea. Recent admission 1/14-1/17 with AMS due to UTI/dehydration. Found with AKI. PMH includes: advanced dementia with behavioral disturbance, CAD, HTN, hard of hearing, gout, IBS, MI, colon cancer.  Clinical Impression  Pt admitted secondary to problem above with deficits below. Pt with increased fatigue and difficulty with ambulation. Required mod A +2 for transfers and to take side steps at EOB. Per previous notes, pt was supervision for gait. Has had significant decline since discharge; recommend SNF level therapy at d/c to address current deficits. Will continue to follow acutely.        Recommendations for follow up therapy are one component of a multi-disciplinary discharge planning process, led by the attending physician.  Recommendations may be updated based on patient status, additional functional criteria and insurance authorization.  Follow Up Recommendations Skilled nursing-short term rehab (<3 hours/day)    Assistance Recommended at Discharge Frequent or constant Supervision/Assistance  Patient can return home with the following  A lot of help with walking and/or transfers;A lot of help with bathing/dressing/bathroom;Direct supervision/assist for medications management;Assistance with cooking/housework;Direct supervision/assist for financial management;Assist for transportation;Help with stairs or ramp for entrance    Equipment Recommendations None recommended by PT  Recommendations for Other Services       Functional Status Assessment Patient has had a recent decline in their functional status and demonstrates the ability to make significant improvements in function in a reasonable and predictable amount of time.     Precautions / Restrictions Precautions Precautions:  Fall Restrictions Weight Bearing Restrictions: No      Mobility  Bed Mobility Overal bed mobility: Needs Assistance Bed Mobility: Supine to Sit, Sit to Supine     Supine to sit: Min assist Sit to supine: Min assist   General bed mobility comments: min assist for trunk support to ascend and BLEs fully back to supine    Transfers Overall transfer level: Needs assistance Equipment used: 2 person hand held assist Transfers: Sit to/from Stand Sit to Stand: Mod assist, +2 physical assistance, +2 safety/equipment           General transfer comment: to power up and steady. Required mod A to take side steps at EOB, but pt fatiguing easily and unable to tolerate further.    Ambulation/Gait                  Stairs            Wheelchair Mobility    Modified Rankin (Stroke Patients Only)       Balance Overall balance assessment: Needs assistance Sitting-balance support: No upper extremity supported, Feet supported Sitting balance-Leahy Scale: Fair Sitting balance - Comments: limited dynamically with posterior lean during LB ADLs   Standing balance support: Bilateral upper extremity supported, During functional activity Standing balance-Leahy Scale: Poor Standing balance comment: relies on external support dynaimcally, able to stand statically with 1 UE support                             Pertinent Vitals/Pain Pain Assessment Pain Assessment: No/denies pain    Home Living Family/patient expects to be discharged to:: Private residence Living Arrangements: Spouse/significant other Available Help at Discharge: Family;Available PRN/intermittently Type of Home: House Home Access: Level entry       Home  Layout: One level Home Equipment: Conservation officer, nature (2 wheels) Additional Comments: questionable historian    Prior Function Prior Level of Function : Independent/Modified Independent;Patient poor historian/Family not available              Mobility Comments: Uses RW for ambulation ADLs Comments: Pt has assist with cooking/cleaning, and medicine management. Reports independent ADLs.     Hand Dominance   Dominant Hand: Right    Extremity/Trunk Assessment   Upper Extremity Assessment Upper Extremity Assessment: Defer to OT evaluation    Lower Extremity Assessment Lower Extremity Assessment: Generalized weakness    Cervical / Trunk Assessment Cervical / Trunk Assessment: Kyphotic  Communication   Communication: HOH  Cognition Arousal/Alertness: Awake/alert Behavior During Therapy: WFL for tasks assessed/performed Overall Cognitive Status: History of cognitive impairments - at baseline                                 General Comments: pt very HOH.  Oriented and following simple commands, poor problem solving, awareness, recall and attention noted during session.        General Comments General comments (skin integrity, edema, etc.): VSS on RA    Exercises     Assessment/Plan    PT Assessment Patient needs continued PT services  PT Problem List Decreased safety awareness;Decreased activity tolerance;Decreased balance;Decreased knowledge of use of DME;Decreased cognition;Decreased mobility;Decreased strength;Decreased knowledge of precautions       PT Treatment Interventions DME instruction;Therapeutic activities;Gait training;Therapeutic exercise;Patient/family education;Stair training;Balance training;Functional mobility training    PT Goals (Current goals can be found in the Care Plan section)  Acute Rehab PT Goals Patient Stated Goal: to eat breakfast PT Goal Formulation: With patient Time For Goal Achievement: 08/22/21 Potential to Achieve Goals: Fair    Frequency Min 2X/week     Co-evaluation PT/OT/SLP Co-Evaluation/Treatment: Yes Reason for Co-Treatment: For patient/therapist safety;To address functional/ADL transfers PT goals addressed during session: Mobility/safety with  mobility;Balance OT goals addressed during session: ADL's and self-care       AM-PAC PT "6 Clicks" Mobility  Outcome Measure Help needed turning from your back to your side while in a flat bed without using bedrails?: A Little Help needed moving from lying on your back to sitting on the side of a flat bed without using bedrails?: A Little Help needed moving to and from a bed to a chair (including a wheelchair)?: Total Help needed standing up from a chair using your arms (e.g., wheelchair or bedside chair)?: Total Help needed to walk in hospital room?: Total Help needed climbing 3-5 steps with a railing? : Total 6 Click Score: 10    End of Session Equipment Utilized During Treatment: Gait belt Activity Tolerance: Patient tolerated treatment well Patient left: in bed;with call bell/phone within reach (on stretcher in ED) Nurse Communication: Mobility status PT Visit Diagnosis: Other abnormalities of gait and mobility (R26.89);Muscle weakness (generalized) (M62.81);Difficulty in walking, not elsewhere classified (R26.2)    Time: 6378-5885 PT Time Calculation (min) (ACUTE ONLY): 25 min   Charges:   PT Evaluation $PT Eval Moderate Complexity: 1 Mod          Reuel Derby, PT, DPT  Acute Rehabilitation Services  Pager: [phone] Office: [phone]   Rudean Hitt 08/08/2021, 1:59 PM

## 2021-08-08 NOTE — Progress Notes (Addendum)
Called report to receiving RN on 5W. Patient belongings with patient when he transferred to 5W. Telemetry monitoring was notified that the patient was being transferred to another unit.

## 2021-08-08 NOTE — Progress Notes (Addendum)
Patient was transported from the ED to [place]. Received report from Oretha Caprice ED-RN. Patient was oriented to his room and the unit. All questions were answered to patient's satisfaction. Attempted to catch up all medications. Patient refused some medications. Central telemetry monitoring was notified that patient was being placed on the monitor.  Awaiting for bed on 5W.

## 2021-08-08 NOTE — Progress Notes (Addendum)
PROGRESS NOTE    Jeffrey Zimmerman  HAL:[id number] DOB: 01/27/1936 DOA: 08/07/2021 PCP: [name], MD   Brief Narrative:  Jeffrey Zimmerman is a 86 y.o. male with medical history significant for advanced dementia with physical deconditioning, CAD s/p prior bare-metal stenting, HTN, or BCC s/p excision, hard of hearing who presented to the ED for evaluation of cough, nausea.   Patient recently admitted 08/02/2021-08/05/2021 for acute metabolic encephalopathy secondary to UTI and dehydration.  Urine culture grew multiple species.  Patient was previously on ampicillin for Enterococcus faecalis UTI and transitioned to amoxicillin on discharge for 7 days.  Patient has been active with the [facility].  He was ultimately discharged to home with plans to seek placement to assisted living facility on an outpatient basis due to his underlying dementia.  Patient returned to the ED from home via EMS for reported chest pain, cough, nausea.  Upon arrival to ED, he was hemodynamically stable.  He was found to have AKI and dehydration. Urinalysis shows negative nitrites, trace leukocytes, 11-20 RBC/hpf, 21-50 WBC/hpf, few bacteria microscopy.  Urine culture ordered.  SARS-CoV-2 and influenza PCR are negative.  2 view chest x-ray negative for focal consolidation, edema, effusion.  Patient was given 500 cc normal saline x 2 and IV ceftriaxone.  The hospitalist service was consulted to admit for further evaluation and management.  Assessment & Plan:   Principal Problem:   AKI (acute kidney injury) ([place]) Active Problems:   Hypertension   Coronary artery disease   Mixed hyperlipidemia   Alzheimer's dementia with behavioral disturbance (HCC)   BPH (benign prostatic hyperplasia)  AKI/dehydration, POA: Renal function improved, still appears dehydrated.  We will resume gentle IV fluids.  Hypokalemia: 3.1.  Will replace.   Alzheimer's dementia with behavioral disturbance ([place])- (present on admission) Chronic  issue limiting ability to care for self at home and resulting in repeat admissions.  Also tried as an outpatient.  Likely needs assisted living facility placement.  Seen by PT OT they recommend SNF.  Discussed with his son who is in agreement. -Continue Celexa and delirium precautions -TOC consulted.   BPH (benign prostatic hyperplasia)- (present on admission) Had indwelling Foley catheter on prior admissions, Foley not present on arrival.  Continue tamsulosin and dutasteride.     Mixed hyperlipidemia- (present on admission) Continue pravastatin.   Coronary artery disease- (present on admission): No chest pain.  Has history of bare-metal stenting. -Continue Plavix and pravastatin   Hypertension- (present on admission): Blood pressure controlled. Continue amlodipine, Lopressor.  Hold lisinopril with AKI.   Recent UTI: Continue oral amoxicillin.  DVT prophylaxis: enoxaparin (LOVENOX) injection 30 mg Start: 08/07/21 2200   Code Status: Full Code  Family Communication:  None present at bedside.  Discussed plan of care with the son over the phone.  Status is: Observation  The patient will require care spanning > 2 midnights and should be moved to inpatient because: Needs SNF placement      Estimated body mass index is 25.34 kg/m as calculated from the following:   Height as of 08/03/21: 5\' 8"  (1.727 m).   Weight as of 08/05/21: 75.6 kg.    Nutritional Assessment: There is no height or weight on file to calculate BMI.. Seen by dietician.  I agree with the assessment and plan as outlined below: Nutrition Status:        . Skin Assessment: I have examined the patient's skin and I agree with the wound assessment as performed by the wound  care RN as outlined below:    Consultants:  None  Procedures:  None  Antimicrobials:  Anti-infectives (From admission, onward)    Start     Dose/Rate Route Frequency Ordered Stop   08/08/21 1000  amoxicillin (AMOXIL) capsule 500 mg         500 mg Oral 2 times daily 08/07/21 2004 08/11/21 0959   08/07/21 2200  amoxicillin (AMOXIL) capsule 500 mg  Status:  Discontinued        500 mg Oral 2 times daily 08/07/21 2003 08/07/21 2004   08/07/21 1815  cefTRIAXone (ROCEPHIN) 1 g in sodium chloride 0.9 % 100 mL IVPB        1 g 200 mL/hr over 30 Minutes Intravenous  Once 08/07/21 1804 08/07/21 2104         Subjective: Patient seen and examined this morning in the ED.  He was alert and oriented to person and time but not place.  Patient was very hard of hearing.  He would not answer any question until it is repeatedly asked at least 3 times.  When asked him to lift his right lower extremity, he could not understand and I had to tap his right lower extremity to make him understand what I was talking about, he started yelling at me and basically said " if you hit my leg 1 more time, I swear I will kill you here".  At that time I calmed him down and left the room.  Objective: Vitals:   08/08/21 1100 08/08/21 1130 08/08/21 1138 08/08/21 1205  BP: (!) 153/85 134/83  133/76  Pulse: 82 76  71  Resp: 17 16  16   Temp:   98.2 F (36.8 C) 98.3 F (36.8 C)  TempSrc:    Oral  SpO2: 96% 96%  100%    Intake/Output Summary (Last 24 hours) at 08/08/2021 1441 Last data filed at 08/08/2021 0854 Gross per 24 hour  Intake 1519.74 ml  Output --  Net 1519.74 ml   There were no vitals filed for this visit.  Examination:  General exam: Appears aggressive  Respiratory system: Clear to auscultation. Respiratory effort normal. Cardiovascular system: S1 & S2 heard, RRR. No JVD, murmurs, rubs, gallops or clicks. No pedal edema. Gastrointestinal system: Abdomen is nondistended, soft and nontender. No organomegaly or masses felt. Normal bowel sounds heard. Central nervous system: Alert and oriented x2. No focal neurological deficits. Extremities: Symmetric 5 x 5 power.  Data Reviewed: I have personally reviewed following labs and imaging  studies  CBC: Recent Labs  Lab 08/02/21 1022 08/03/21 0327 08/04/21 0812 08/07/21 1409 08/08/21 0603  WBC 9.3 11.1* 10.4 12.0* 6.6  NEUTROABS 7.8*  --  8.9* 10.0*  --   HGB 13.4 11.6* 11.5* 14.2 11.2*  HCT 39.7 34.2* 35.1* 42.9 35.3*  MCV 91.3 90.5 89.8 90.5 94.4  PLT 199 174 190 331 427   Basic Metabolic Panel: Recent Labs  Lab 08/02/21 1022 08/03/21 0327 08/04/21 0812 08/07/21 1409 08/08/21 0603  NA 139 135 139 143 143  K 2.9* 3.2* 3.2* 3.7 3.1*  CL 95* 103 102 106 107  CO2 26 24 23 25 24   GLUCOSE 112* 106* 105* 152* 82  BUN 16 10 9  27* 24*  CREATININE 1.05 0.76 0.78 1.81* 0.96  CALCIUM 8.8* 7.7* 8.3* 9.3 8.1*  MG 1.6* 1.8  --  2.0  --    GFR: Estimated Creatinine Clearance: 56.4 mL/min (by C-G formula based on SCr of 0.96 mg/dL). Liver Function Tests:  Recent Labs  Lab 08/02/21 1022  AST 34  ALT 34  ALKPHOS 81  BILITOT 1.4*  PROT 6.8  ALBUMIN 3.2*   No results for input(s): LIPASE, AMYLASE in the last 168 hours. No results for input(s): AMMONIA in the last 168 hours. Coagulation Profile: No results for input(s): INR, PROTIME in the last 168 hours. Cardiac Enzymes: No results for input(s): CKTOTAL, CKMB, CKMBINDEX, TROPONINI in the last 168 hours. BNP (last 3 results) No results for input(s): PROBNP in the last 8760 hours. HbA1C: No results for input(s): HGBA1C in the last 72 hours. CBG: Recent Labs  Lab 08/02/21 1153  GLUCAP 100*   Lipid Profile: No results for input(s): CHOL, HDL, LDLCALC, TRIG, CHOLHDL, LDLDIRECT in the last 72 hours. Thyroid Function Tests: No results for input(s): TSH, T4TOTAL, FREET4, T3FREE, THYROIDAB in the last 72 hours. Anemia Panel: No results for input(s): VITAMINB12, FOLATE, FERRITIN, TIBC, IRON, RETICCTPCT in the last 72 hours. Sepsis Labs: Recent Labs  Lab 08/02/21 1140 08/03/21 0847 08/03/21 1925  LATICACIDVEN 4.2* 2.0* 1.3    Recent Results (from the past 240 hour(s))  Urine Culture     Status: Abnormal    Collection Time: 08/02/21 10:34 AM   Specimen: Urine, Catheterized  Result Value Ref Range Status   Specimen Description URINE, CATHETERIZED  Final   Special Requests   Final    NONE Performed at [facility], [address] [place], [place] [address]    Culture MULTIPLE SPECIES PRESENT, SUGGEST RECOLLECTION (A)  Final   Report Status 08/03/2021 FINAL  Final  Resp Panel by RT-PCR (Flu A&B, Covid) Nasopharyngeal Swab     Status: None   Collection Time: 08/02/21 10:34 AM   Specimen: Nasopharyngeal Swab; Nasopharyngeal(NP) swabs in vial transport medium  Result Value Ref Range Status   SARS Coronavirus 2 by RT PCR NEGATIVE NEGATIVE Final    Comment: (NOTE) SARS-CoV-2 target nucleic acids are NOT DETECTED.  The SARS-CoV-2 RNA is generally detectable in upper respiratory specimens during the acute phase of infection. The lowest concentration of SARS-CoV-2 viral copies this assay can detect is 138 copies/mL. A negative result does not preclude SARS-Cov-2 infection and should not be used as the sole basis for treatment or other patient management decisions. A negative result may occur with  improper specimen collection/handling, submission of specimen other than nasopharyngeal swab, presence of viral mutation(s) within the areas targeted by this assay, and inadequate number of viral copies(<138 copies/mL). A negative result must be combined with clinical observations, patient history, and epidemiological information. The expected result is Negative.  Fact Sheet for Patients:  EntrepreneurPulse.com.au  Fact Sheet for Healthcare Providers:  IncredibleEmployment.be  This test is no t yet approved or cleared by the [place] FDA and  has been authorized for detection and/or diagnosis of SARS-CoV-2 by FDA under an Emergency Use Authorization (EUA). This EUA will remain  in effect (meaning this test can be used) for the duration of  the COVID-19 declaration under Section 564(b)(1) of the Act, 21 U.S.C.section 360bbb-3(b)(1), unless the authorization is terminated  or revoked sooner.       Influenza A by PCR NEGATIVE NEGATIVE Final   Influenza B by PCR NEGATIVE NEGATIVE Final    Comment: (NOTE) The Xpert Xpress SARS-CoV-2/FLU/RSV plus assay is intended as an aid in the diagnosis of influenza from Nasopharyngeal swab specimens and should not be used as a sole basis for treatment. Nasal washings and aspirates are unacceptable for Xpert Xpress SARS-CoV-2/FLU/RSV testing.  Fact Sheet for Patients: EntrepreneurPulse.com.au  Fact Sheet for Healthcare Providers: IncredibleEmployment.be  This test is not yet approved or cleared by the [place] FDA and has been authorized for detection and/or diagnosis of SARS-CoV-2 by FDA under an Emergency Use Authorization (EUA). This EUA will remain in effect (meaning this test can be used) for the duration of the COVID-19 declaration under Section 564(b)(1) of the Act, 21 U.S.C. section 360bbb-3(b)(1), unless the authorization is terminated or revoked.  Performed at [facility], [place] [address] [place], [place] [address]   Culture, blood (routine x 2)     Status: None   Collection Time: 08/02/21 11:35 AM   Specimen: BLOOD  Result Value Ref Range Status   Specimen Description BLOOD LEFT ANTECUBITAL  Final   Special Requests   Final    BOTTLES DRAWN AEROBIC AND ANAEROBIC Blood Culture adequate volume   Culture   Final    NO GROWTH 5 DAYS Performed at [facility], [place] [address] [place], [place] [address]    Report Status 08/07/2021 FINAL  Final  Culture, blood (routine x 2)     Status: None   Collection Time: 08/02/21 11:40 AM   Specimen: BLOOD RIGHT FOREARM  Result Value Ref Range Status   Specimen Description BLOOD RIGHT FOREARM  Final   Special Requests   Final    BOTTLES DRAWN AEROBIC AND ANAEROBIC Blood  Culture adequate volume   Culture   Final    NO GROWTH 5 DAYS Performed at [facility], [place] [address], [place], [place] [address]    Report Status 08/07/2021 FINAL  Final  Resp Panel by RT-PCR (Flu A&B, Covid) Nasopharyngeal Swab     Status: None   Collection Time: 08/07/21  3:12 PM   Specimen: Nasopharyngeal Swab; Nasopharyngeal(NP) swabs in vial transport medium  Result Value Ref Range Status   SARS Coronavirus 2 by RT PCR NEGATIVE NEGATIVE Final    Comment: (NOTE) SARS-CoV-2 target nucleic acids are NOT DETECTED.  The SARS-CoV-2 RNA is generally detectable in upper respiratory specimens during the acute phase of infection. The lowest concentration of SARS-CoV-2 viral copies this assay can detect is 138 copies/mL. A negative result does not preclude SARS-Cov-2 infection and should not be used as the sole basis for treatment or other patient management decisions. A negative result may occur with  improper specimen collection/handling, submission of specimen other than nasopharyngeal swab, presence of viral mutation(s) within the areas targeted by this assay, and inadequate number of viral copies(<138 copies/mL). A negative result must be combined with clinical observations, patient history, and epidemiological information. The expected result is Negative.  Fact Sheet for Patients:  EntrepreneurPulse.com.au  Fact Sheet for Healthcare Providers:  IncredibleEmployment.be  This test is no t yet approved or cleared by the [place] FDA and  has been authorized for detection and/or diagnosis of SARS-CoV-2 by FDA under an Emergency Use Authorization (EUA). This EUA will remain  in effect (meaning this test can be used) for the duration of the COVID-19 declaration under Section 564(b)(1) of the Act, 21 U.S.C.section 360bbb-3(b)(1), unless the authorization is terminated  or revoked sooner.       Influenza A by PCR NEGATIVE NEGATIVE  Final   Influenza B by PCR NEGATIVE NEGATIVE Final    Comment: (NOTE) The Xpert Xpress SARS-CoV-2/FLU/RSV plus assay is intended as an aid in the diagnosis of influenza from Nasopharyngeal swab specimens and should not be used as a sole basis for  treatment. Nasal washings and aspirates are unacceptable for Xpert Xpress SARS-CoV-2/FLU/RSV testing.  Fact Sheet for Patients: EntrepreneurPulse.com.au  Fact Sheet for Healthcare Providers: IncredibleEmployment.be  This test is not yet approved or cleared by the [place] FDA and has been authorized for detection and/or diagnosis of SARS-CoV-2 by FDA under an Emergency Use Authorization (EUA). This EUA will remain in effect (meaning this test can be used) for the duration of the COVID-19 declaration under Section 564(b)(1) of the Act, 21 U.S.C. section 360bbb-3(b)(1), unless the authorization is terminated or revoked.  Performed at [facility], [place] [address] [place], [place] [address]      Radiology Studies: DG Chest 2 View  Result Date: 08/07/2021 CLINICAL DATA:  Chest pain EXAM: CHEST - 2 VIEW COMPARISON:  08/03/2021 FINDINGS: The heart size and mediastinal contours are within normal limits. Both lungs are clear. Disc degenerative disease of the thoracic spine. IMPRESSION: No acute abnormality of the lungs. Electronically Signed   By: Delanna Ahmadi M.D.   On: 08/07/2021 14:42    Scheduled Meds:  [START ON 08/09/2021] amLODipine  5 mg Oral Daily   amoxicillin  500 mg Oral BID   citalopram  10 mg Oral Daily   clopidogrel  75 mg Oral Daily   dutasteride  0.5 mg Oral Daily   enoxaparin (LOVENOX) injection  30 mg Subcutaneous Q24H   erythromycin  1 application Right Eye QID   metoprolol tartrate  12.5 mg Oral BID   potassium chloride  40 mEq Oral Q4H   pravastatin  20 mg Oral Daily   senna-docusate  2 tablet Oral QHS   tamsulosin  0.8 mg Oral QHS   Continuous Infusions:   LOS: 0  days   Time spent: 35 minutes  Darliss Cheney, MD [facility]  08/08/2021, 2:41 PM  Please page via [place] and do not message via secure chat for anything urgent. Secure chat can be used for anything non urgent.  How to contact the [facility] Attending or Consulting provider [place] or covering provider during after hours [place], for this patient?  Check the care team in [facility] and look for a) attending/consulting TR[facility] provider listed and b) the [facility] team listed. Page or secure chat [phone]. Log into [email or web address] and use [place]'s universal password to access. If you do not have the password, please contact the hospital operator. Locate the [facility] provider you are looking for under [facility] and page to a number that you can be directly reached. If you still have difficulty reaching the provider, please page the [facility] (Director on Call) for the Hospitalists listed on amion for assistance.

## 2021-08-08 NOTE — Progress Notes (Addendum)
Notified Dr. Marlowe Sax of hypertension.

## 2021-08-09 ENCOUNTER — Observation Stay (HOSPITAL_COMMUNITY): Payer: Medicare (Managed Care)

## 2021-08-09 DIAGNOSIS — E860 Dehydration: Secondary | ICD-10-CM | POA: Diagnosis present

## 2021-08-09 DIAGNOSIS — I2510 Atherosclerotic heart disease of native coronary artery without angina pectoris: Secondary | ICD-10-CM | POA: Diagnosis present

## 2021-08-09 DIAGNOSIS — Z79899 Other long term (current) drug therapy: Secondary | ICD-10-CM | POA: Diagnosis not present

## 2021-08-09 DIAGNOSIS — Z955 Presence of coronary angioplasty implant and graft: Secondary | ICD-10-CM | POA: Diagnosis not present

## 2021-08-09 DIAGNOSIS — Z800 Family history of malignant neoplasm of digestive organs: Secondary | ICD-10-CM | POA: Diagnosis not present

## 2021-08-09 DIAGNOSIS — Z8249 Family history of ischemic heart disease and other diseases of the circulatory system: Secondary | ICD-10-CM | POA: Diagnosis not present

## 2021-08-09 DIAGNOSIS — G510 Bell's palsy: Secondary | ICD-10-CM | POA: Diagnosis present

## 2021-08-09 DIAGNOSIS — G309 Alzheimer's disease, unspecified: Secondary | ICD-10-CM | POA: Diagnosis present

## 2021-08-09 DIAGNOSIS — Z7902 Long term (current) use of antithrombotics/antiplatelets: Secondary | ICD-10-CM | POA: Diagnosis not present

## 2021-08-09 DIAGNOSIS — Z20822 Contact with and (suspected) exposure to covid-19: Secondary | ICD-10-CM | POA: Diagnosis present

## 2021-08-09 DIAGNOSIS — M109 Gout, unspecified: Secondary | ICD-10-CM | POA: Diagnosis present

## 2021-08-09 DIAGNOSIS — I252 Old myocardial infarction: Secondary | ICD-10-CM | POA: Diagnosis not present

## 2021-08-09 DIAGNOSIS — D72828 Other elevated white blood cell count: Secondary | ICD-10-CM | POA: Diagnosis present

## 2021-08-09 DIAGNOSIS — Z9049 Acquired absence of other specified parts of digestive tract: Secondary | ICD-10-CM | POA: Diagnosis not present

## 2021-08-09 DIAGNOSIS — E782 Mixed hyperlipidemia: Secondary | ICD-10-CM | POA: Diagnosis present

## 2021-08-09 DIAGNOSIS — Z85038 Personal history of other malignant neoplasm of large intestine: Secondary | ICD-10-CM | POA: Diagnosis not present

## 2021-08-09 DIAGNOSIS — F02818 Dementia in other diseases classified elsewhere, unspecified severity, with other behavioral disturbance: Secondary | ICD-10-CM | POA: Diagnosis present

## 2021-08-09 DIAGNOSIS — Z85828 Personal history of other malignant neoplasm of skin: Secondary | ICD-10-CM | POA: Diagnosis not present

## 2021-08-09 DIAGNOSIS — N179 Acute kidney failure, unspecified: Secondary | ICD-10-CM | POA: Diagnosis present

## 2021-08-09 DIAGNOSIS — I10 Essential (primary) hypertension: Secondary | ICD-10-CM | POA: Diagnosis present

## 2021-08-09 DIAGNOSIS — E876 Hypokalemia: Secondary | ICD-10-CM | POA: Diagnosis present

## 2021-08-09 DIAGNOSIS — H268 Other specified cataract: Secondary | ICD-10-CM | POA: Diagnosis present

## 2021-08-09 DIAGNOSIS — N400 Enlarged prostate without lower urinary tract symptoms: Secondary | ICD-10-CM | POA: Diagnosis present

## 2021-08-09 LAB — BASIC METABOLIC PANEL
Anion gap: 9 (ref 5–15)
BUN: 12 mg/dL (ref 8–23)
CO2: 27 mmol/L (ref 22–32)
Calcium: 8.3 mg/dL — ABNORMAL LOW (ref 8.9–10.3)
Chloride: 103 mmol/L (ref 98–111)
Creatinine, Ser: 0.7 mg/dL (ref 0.61–1.24)
GFR, Estimated: >60 mL/min (ref >60)
Glucose, Bld: 96 mg/dL (ref 70–99)
Potassium: 3.5 mmol/L (ref 3.5–5.1)
Sodium: 139 mmol/L (ref 135–145)

## 2021-08-09 LAB — CBC WITH DIFFERENTIAL/PLATELET
Abs Immature Granulocytes: 0.04 10*3/uL (ref 0.00–0.07)
Basophils Absolute: 0 10*3/uL (ref 0.0–0.1)
Basophils Relative: 1 %
Eosinophils Absolute: 0.2 10*3/uL (ref 0.0–0.5)
Eosinophils Relative: 5 %
HCT: 36.3 % — ABNORMAL LOW (ref 39.0–52.0)
Hemoglobin: 11.8 g/dL — ABNORMAL LOW (ref 13.0–17.0)
Immature Granulocytes: 1 %
Lymphocytes Relative: 13 %
Lymphs Abs: 0.6 10*3/uL — ABNORMAL LOW (ref 0.7–4.0)
MCH: 29.2 pg (ref 26.0–34.0)
MCHC: 32.5 g/dL (ref 30.0–36.0)
MCV: 89.9 fL (ref 80.0–100.0)
Monocytes Absolute: 0.3 10*3/uL (ref 0.1–1.0)
Monocytes Relative: 7 %
Neutro Abs: 3.2 10*3/uL (ref 1.7–7.7)
Neutrophils Relative %: 73 %
Platelets: 203 10*3/uL (ref 150–400)
RBC: 4.04 MIL/uL — ABNORMAL LOW (ref 4.22–5.81)
RDW: 13.8 % (ref 11.5–15.5)
WBC: 4.5 10*3/uL (ref 4.0–10.5)
nRBC: 0 % (ref 0.0–0.2)

## 2021-08-09 LAB — MAGNESIUM: Magnesium: 1.7 mg/dL (ref 1.7–2.4)

## 2021-08-09 LAB — URINE CULTURE: Culture: NO GROWTH

## 2021-08-09 MED ORDER — SODIUM CHLORIDE 0.9 % IV SOLN: 1.0000 g | INTRAVENOUS | Status: DC

## 2021-08-09 NOTE — Progress Notes (Signed)
@  1400 pt brought down to MRI for exam. Attempted to prepare pt for exam. Explained exam to pt. Pt stated they could not lay flat for exam. Pt said "absolutely not, I cannot lay flat". Pt required to lay flat for exam. RN made aware. RN stated to send pt back to room and would contact doc about exam. Pt sent back to room via pt transport.

## 2021-08-09 NOTE — Evaluation (Signed)
Clinical/Bedside Swallow Evaluation Patient Details  Name: Jeffrey Zimmerman MRN: [id number] Date of Birth: [birth year]  Today's Date: 08/09/2021 Time: SLP Start Time (ACUTE ONLY): 67 SLP Stop Time (ACUTE ONLY): 1250 SLP Time Calculation (min) (ACUTE ONLY): 10 min  Past Medical History:  Past Medical History:  Diagnosis Date   Adenomatous colon polyp 08/2008   Arthritis    Basal cell carcinoma    Cancer ([place])    colon   Coronary artery disease    04/21/10 [place] MI with BMS to LAD with residual proximal LAD EF 40%   Gout    History of cholecystectomy    History of colonoscopy    1/5 AND 2/10   Hypertension    Irritable bowel syndrome    Myocardial infarct (HCC)    SVT (supraventricular tachycardia) ([place])    Past Surgical History:  Past Surgical History:  Procedure Laterality Date   ADJACENT TISSUE TRANSFER/TISSUE REARRANGEMENT Right 06/23/2021   Procedure: ADJACENT TISSUE TRANSFER/TISSUE REARRANGEMENT;  Surgeon: Cindra Presume, MD;  Location: [place];  Service: Plastics;  Laterality: Right;   APPENDECTOMY     CARDIAC CATHETERIZATION     04/21/10 An[place] MI with BMS to LAD with residual proximal LAD EF 40%   CHOLECYSTECTOMY     pt does not remember when   [place] Right 06/23/2021   Procedure: Right cheek reconstruction;  Surgeon: Cindra Presume, MD;  Location: [place];  Service: Plastics;  Laterality: Right;   KNEE ARTHOSCOPY Bilateral    PAROTIDECTOMY N/A 06/23/2021   Procedure: PAROTIDECTOMY  RIGHT SUPERFICIAL WITH FACIAL NERVE DISSECTION;  Surgeon: Izora Gala, MD;  Location: [place];  Service: ENT;  Laterality: N/A;   PARTIAL LEFT NEPHRECTOMY     (for oncocytoma-benign)   HPI:  Jeffrey Zimmerman is a 87 y.o. 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 MRI is pending, if no CVA pt to have f/u with surgeons for steriod treatment.     Assessment / Plan / Recommendation  Clinical Impression  Pt demonstrates no signs of aspiration or dysphagia. His right CN VII weakness is present, but does not impact speech or swallowing. His RN reports he had a significant coughing when taking meds crushed in puree. Recommend initiating a regular diet and thin liquids with brief f/u for tolerance given RN report. SLP Visit Diagnosis: Dysphagia, unspecified (R13.10)    Aspiration Risk  Mild aspiration risk    Diet Recommendation Regular;Thin liquid   Liquid Administration via: Cup;Straw Medication Administration: Whole meds with liquid Supervision: Patient able to self feed Compensations: Minimize environmental distractions;Slow rate;Small sips/bites Postural Changes: Seated upright at 90 degrees    Other  Recommendations      Recommendations for follow up therapy are one component of a multi-disciplinary discharge planning process, led by the attending physician.  Recommendations may be updated based on patient status, additional functional criteria and insurance authorization.  Follow up Recommendations No SLP follow up      Assistance Recommended at Discharge    Functional Status Assessment Patient has had a recent decline in their functional status and demonstrates the ability to make significant improvements in function in a reasonable and predictable amount of time.  Frequency and Duration min 1 x/week  1 week       Prognosis        Swallow Study   General HPI: Jeffrey Zimmerman is  a 86 y.o. 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 MRI is pending, if no CVA pt to have f/u with surgeons for steriod treatment. Type of Study: Bedside Swallow Evaluation Diet Prior to this Study: NPO Temperature Spikes Noted: No Respiratory Status: Room air History of Recent Intubation: No Behavior/Cognition:  Alert;Cooperative Oral Care Completed by SLP: No Oral Cavity - Dentition: Adequate natural dentition Vision: Functional for self-feeding Self-Feeding Abilities: Able to feed self Patient Positioning: Upright in bed Baseline Vocal Quality: Normal Volitional Cough: Strong Volitional Swallow: Able to elicit    Oral/Motor/Sensory Function Overall Oral Motor/Sensory Function: Mild impairment Facial ROM: Reduced right;Suspected CN VII (facial) dysfunction Facial Symmetry: Abnormal symmetry right;Suspected CN VII (facial) dysfunction Facial Strength: Reduced right;Suspected CN VII (facial) dysfunction Facial Sensation: Within Functional Limits Lingual ROM: Within Functional Limits Lingual Symmetry: Within Functional Limits Lingual Strength: Within Functional Limits Lingual Sensation: Within Functional Limits   Ice Chips     Thin Liquid Thin Liquid: Within functional limits Presentation: Straw    Nectar Thick Nectar Thick Liquid: Not tested   Honey Thick Honey Thick Liquid: Not tested   Puree Puree: Within functional limits   Solid     Solid: Within functional limits Presentation: Self Fed      Bettyann Birchler, Katherene Ponto 08/09/2021,1:08 PM

## 2021-08-09 NOTE — Progress Notes (Signed)
Pt refusing MRI, states he absolutely cannot lay flat for study. Becomes agitated and combative when pushed to try further. Pt refusing MRI at this time.

## 2021-08-09 NOTE — Progress Notes (Signed)
PROGRESS NOTE    Jeffrey Zimmerman  OVF:[id number] DOB: 03-15-1936 DOA: 08/07/2021 PCP: [name], MD   Brief Narrative:  Jeffrey Zimmerman is a 86 y.o. male with medical history significant for advanced dementia with physical deconditioning, CAD s/p prior bare-metal stenting, HTN, or BCC s/p excision, hard of hearing who presented to the ED for evaluation of cough, nausea.   Patient recently admitted 08/02/2021-08/05/2021 for acute metabolic encephalopathy secondary to UTI and dehydration.  Urine culture grew multiple species.  Patient was previously on ampicillin for Enterococcus faecalis UTI and transitioned to amoxicillin on discharge for 7 days.  Patient has been active with the pace program.  He was ultimately discharged to home with plans to seek placement to assisted living facility on an outpatient basis due to his underlying dementia.  Patient returned to the ED from home via EMS for reported chest pain, cough, nausea.  Upon arrival to ED, he was hemodynamically stable.  He was found to have AKI and dehydration. Urinalysis shows negative nitrites, trace leukocytes, 11-20 RBC/hpf, 21-50 WBC/hpf, few bacteria microscopy.  Urine culture ordered.  SARS-CoV-2 and influenza PCR are negative.  2 view chest x-ray negative for focal consolidation, edema, effusion.  Patient was given 500 cc normal saline x 2 and IV ceftriaxone.  The hospitalist service was consulted to admit for further evaluation and management.  Assessment & Plan:   Principal Problem:   AKI (acute kidney injury) ([place]) Active Problems:   Hypertension   Coronary artery disease   Mixed hyperlipidemia   Alzheimer's dementia with behavioral disturbance (HCC)   BPH (benign prostatic hyperplasia)  AKI/dehydration, POA: Renal function improved.  We will continue IV fluids since he is n.p.o.  Hypokalemia: Resolved.  We will give him another IV dose since he is at borderline.   Alzheimer's dementia with behavioral disturbance  ([place])- (present on admission) Chronic issue limiting ability to care for self at home and resulting in repeat admissions.  Also tried as an outpatient.  Likely needs assisted living facility placement.  Seen by PT OT they recommend SNF.  Discussed with his son who is in agreement. -Continue Celexa and delirium precautions -TOC consulted.   BPH (benign prostatic hyperplasia)- (present on admission) Had indwelling Foley catheter on prior admissions, Foley not present on arrival.  Continue tamsulosin and dutasteride.     Mixed hyperlipidemia- (present on admission) Continue pravastatin.   Coronary artery disease- (present on admission): No chest pain.  Has history of bare-metal stenting. -Continue Plavix and pravastatin   Hypertension- (present on admission): Blood pressure controlled. Continue amlodipine, Lopressor.  Hold lisinopril with AKI.   Recent UTI: Continue oral amoxicillin.  Facial weakness: Chart reviewed.  It appears that rapid response was called last night since patient was found to have right facial weakness.  Code stroke was called, patient was seen by neurology.  CT head was done which was unremarkable.  Patient did not have any other focal deficits.  MRI was ordered which is not done yet.  On my examination today and compared to yesterday, he does have right facial weakness but he did have the same yesterday as well.  He is a speech is clear.  His right facial weakness is secondary to recent surgery.  I do not see any swelling other than the swelling around the eye which also does not look any different than yesterday.  He does not have any other focal deficit than yesterday.  He is alert and oriented x2 like he was  yesterday.  I will wait for MRI but doubt this is a stroke.  Neurology has suggested talking to patient's ENT physician for possible steroids however I am unable to get a hold of patient's ENT physician over the weekend.  DVT prophylaxis: enoxaparin (LOVENOX)  injection 30 mg Start: 08/07/21 2200   Code Status: Full Code  Family Communication:  None present at bedside.   Status is: Observation  The patient will require care spanning > 2 midnights and should be moved to inpatient because: Needs SNF placement      Estimated body mass index is 25.34 kg/m as calculated from the following:   Height as of 08/03/21: 5\' 8"  (1.727 m).   Weight as of 08/05/21: 75.6 kg.    Nutritional Assessment: There is no height or weight on file to calculate BMI.. Seen by dietician.  I agree with the assessment and plan as outlined below: Nutrition Status:        . Skin Assessment: I have examined the patient's skin and I agree with the wound assessment as performed by the wound care RN as outlined below:    Consultants:  None  Procedures:  None  Antimicrobials:  Anti-infectives (From admission, onward)    Start     Dose/Rate Route Frequency Ordered Stop   08/09/21 0930  cefTRIAXone (ROCEPHIN) 1 g in sodium chloride 0.9 % 100 mL IVPB  Status:  Discontinued        1 g 200 mL/hr over 30 Minutes Intravenous Every 24 hours 08/09/21 0832 08/09/21 0838   08/08/21 1000  amoxicillin (AMOXIL) capsule 500 mg        500 mg Oral 2 times daily 08/07/21 2004 08/11/21 0959   08/07/21 2200  amoxicillin (AMOXIL) capsule 500 mg  Status:  Discontinued        500 mg Oral 2 times daily 08/07/21 2003 08/07/21 2004   08/07/21 1815  cefTRIAXone (ROCEPHIN) 1 g in sodium chloride 0.9 % 100 mL IVPB        1 g 200 mL/hr over 30 Minutes Intravenous  Once 08/07/21 1804 08/07/21 2104         Subjective:  Patient seen and examined.  He has no complaints.  He is fully alert and oriented x2.  He is in better mood compared to yesterday.  Objective: Vitals:   08/09/21 0246 08/09/21 0400 08/09/21 0600 08/09/21 1147  BP: (!) 159/82 (!) 142/65 133/66 (!) 155/81  Pulse: 93 74 66   Resp: 19 20 19 18   Temp: 98 F (36.7 C) 97.8 F (36.6 C) 98 F (36.7 C) 98.3 F (36.8 C)   TempSrc: Oral Oral Oral Oral  SpO2: 94% 94% 96% 98%    Intake/Output Summary (Last 24 hours) at 08/09/2021 1309 Last data filed at 08/09/2021 0400 Gross per 24 hour  Intake 189.62 ml  Output 550 ml  Net -360.38 ml    There were no vitals filed for this visit.  Examination:  General exam: Appears calm and comfortable  Respiratory system: Clear to auscultation. Respiratory effort normal. Cardiovascular system: S1 & S2 heard, RRR. No JVD, murmurs, rubs, gallops or clicks. No pedal edema. Gastrointestinal system: Abdomen is nondistended, soft and nontender. No organomegaly or masses felt. Normal bowel sounds heard. Central nervous system: Alert and oriented x2.  Right facial weakness with some swelling around right eye but no other focal neurological deficit. Extremities: Symmetric 5 x 5 power. Skin: No rashes, lesions or ulcers.  Psychiatry: Judgement and insight appear poor  Data Reviewed: I have personally reviewed following labs and imaging studies  CBC: Recent Labs  Lab 08/03/21 0327 08/04/21 0812 08/07/21 1409 08/08/21 0603 08/09/21 0923  WBC 11.1* 10.4 12.0* 6.6 4.5  NEUTROABS  --  8.9* 10.0*  --  3.2  HGB 11.6* 11.5* 14.2 11.2* 11.8*  HCT 34.2* 35.1* 42.9 35.3* 36.3*  MCV 90.5 89.8 90.5 94.4 89.9  PLT 174 190 331 188 518    Basic Metabolic Panel: Recent Labs  Lab 08/03/21 0327 08/04/21 0812 08/07/21 1409 08/08/21 0603 08/09/21 0923  NA 135 139 143 143 139  K 3.2* 3.2* 3.7 3.1* 3.5  CL 103 102 106 107 103  CO2 24 23 25 24 27   GLUCOSE 106* 105* 152* 82 96  BUN 10 9 27* 24* 12  CREATININE 0.76 0.78 1.81* 0.96 0.70  CALCIUM 7.7* 8.3* 9.3 8.1* 8.3*  MG 1.8  --  2.0  --  1.7    GFR: Estimated Creatinine Clearance: 67.7 mL/min (by C-G formula based on SCr of 0.7 mg/dL). Liver Function Tests: No results for input(s): AST, ALT, ALKPHOS, BILITOT, PROT, ALBUMIN in the last 168 hours.  No results for input(s): LIPASE, AMYLASE in the last 168 hours. No  results for input(s): AMMONIA in the last 168 hours. Coagulation Profile: No results for input(s): INR, PROTIME in the last 168 hours. Cardiac Enzymes: No results for input(s): CKTOTAL, CKMB, CKMBINDEX, TROPONINI in the last 168 hours. BNP (last 3 results) No results for input(s): PROBNP in the last 8760 hours. HbA1C: No results for input(s): HGBA1C in the last 72 hours. CBG: Recent Labs  Lab 08/08/21 2107  GLUCAP 99    Lipid Profile: No results for input(s): CHOL, HDL, LDLCALC, TRIG, CHOLHDL, LDLDIRECT in the last 72 hours. Thyroid Function Tests: No results for input(s): TSH, T4TOTAL, FREET4, T3FREE, THYROIDAB in the last 72 hours. Anemia Panel: No results for input(s): VITAMINB12, FOLATE, FERRITIN, TIBC, IRON, RETICCTPCT in the last 72 hours. Sepsis Labs: Recent Labs  Lab 08/03/21 0847 08/03/21 1925  LATICACIDVEN 2.0* 1.3     Recent Results (from the past 240 hour(s))  Urine Culture     Status: Abnormal   Collection Time: 08/02/21 10:34 AM   Specimen: Urine, Catheterized  Result Value Ref Range Status   Specimen Description URINE, CATHETERIZED  Final   Special Requests   Final    NONE Performed at [facility], [address] [place], [place] [address]    Culture MULTIPLE SPECIES PRESENT, SUGGEST RECOLLECTION (A)  Final   Report Status 08/03/2021 FINAL  Final  Resp Panel by RT-PCR (Flu A&B, Covid) Nasopharyngeal Swab     Status: None   Collection Time: 08/02/21 10:34 AM   Specimen: Nasopharyngeal Swab; Nasopharyngeal(NP) swabs in vial transport medium  Result Value Ref Range Status   SARS Coronavirus 2 by RT PCR NEGATIVE NEGATIVE Final    Comment: (NOTE) SARS-CoV-2 target nucleic acids are NOT DETECTED.  The SARS-CoV-2 RNA is generally detectable in upper respiratory specimens during the acute phase of infection. The lowest concentration of SARS-CoV-2 viral copies this assay can detect is 138 copies/mL. A negative result does not preclude  SARS-Cov-2 infection and should not be used as the sole basis for treatment or other patient management decisions. A negative result may occur with  improper specimen collection/handling, submission of specimen other than nasopharyngeal swab, presence of viral mutation(s) within the areas targeted by this assay, and inadequate number of viral copies(<138 copies/mL). A negative result must be combined  with clinical observations, patient history, and epidemiological information. The expected result is Negative.  Fact Sheet for Patients:  EntrepreneurPulse.com.au  Fact Sheet for Healthcare Providers:  IncredibleEmployment.be  This test is no t yet approved or cleared by the [place] FDA and  has been authorized for detection and/or diagnosis of SARS-CoV-2 by FDA under an Emergency Use Authorization (EUA). This EUA will remain  in effect (meaning this test can be used) for the duration of the COVID-19 declaration under Section 564(b)(1) of the Act, 21 U.S.C.section 360bbb-3(b)(1), unless the authorization is terminated  or revoked sooner.       Influenza A by PCR NEGATIVE NEGATIVE Final   Influenza B by PCR NEGATIVE NEGATIVE Final    Comment: (NOTE) The Xpert Xpress SARS-CoV-2/FLU/RSV plus assay is intended as an aid in the diagnosis of influenza from Nasopharyngeal swab specimens and should not be used as a sole basis for treatment. Nasal washings and aspirates are unacceptable for Xpert Xpress SARS-CoV-2/FLU/RSV testing.  Fact Sheet for Patients: EntrepreneurPulse.com.au  Fact Sheet for Healthcare Providers: IncredibleEmployment.be  This test is not yet approved or cleared by the [place] FDA and has been authorized for detection and/or diagnosis of SARS-CoV-2 by FDA under an Emergency Use Authorization (EUA). This EUA will remain in effect (meaning this test can be used) for the duration of  the COVID-19 declaration under Section 564(b)(1) of the Act, 21 U.S.C. section 360bbb-3(b)(1), unless the authorization is terminated or revoked.  Performed at [facility], [place] [address], [place], [place] [address]   Culture, blood (routine x 2)     Status: None   Collection Time: 08/02/21 11:35 AM   Specimen: BLOOD  Result Value Ref Range Status   Specimen Description BLOOD LEFT ANTECUBITAL  Final   Special Requests   Final    BOTTLES DRAWN AEROBIC AND ANAEROBIC Blood Culture adequate volume   Culture   Final    NO GROWTH 5 DAYS Performed at [facility], [place] [address], [place], [place] [address]    Report Status 08/07/2021 FINAL  Final  Culture, blood (routine x 2)     Status: None   Collection Time: 08/02/21 11:40 AM   Specimen: BLOOD RIGHT FOREARM  Result Value Ref Range Status   Specimen Description BLOOD RIGHT FOREARM  Final   Special Requests   Final    BOTTLES DRAWN AEROBIC AND ANAEROBIC Blood Culture adequate volume   Culture   Final    NO GROWTH 5 DAYS Performed at [facility], [place] [address] [place], [place] [address]    Report Status 08/07/2021 FINAL  Final  Resp Panel by RT-PCR (Flu A&B, Covid) Nasopharyngeal Swab     Status: None   Collection Time: 08/07/21  3:12 PM   Specimen: Nasopharyngeal Swab; Nasopharyngeal(NP) swabs in vial transport medium  Result Value Ref Range Status   SARS Coronavirus 2 by RT PCR NEGATIVE NEGATIVE Final    Comment: (NOTE) SARS-CoV-2 target nucleic acids are NOT DETECTED.  The SARS-CoV-2 RNA is generally detectable in upper respiratory specimens during the acute phase of infection. The lowest concentration of SARS-CoV-2 viral copies this assay can detect is 138 copies/mL. A negative result does not preclude SARS-Cov-2 infection and should not be used as the sole basis for treatment or other patient management decisions. A negative result may occur with  improper specimen collection/handling, submission of  specimen other than nasopharyngeal swab, presence of viral mutation(s) within the areas targeted by this assay,  and inadequate number of viral copies(<138 copies/mL). A negative result must be combined with clinical observations, patient history, and epidemiological information. The expected result is Negative.  Fact Sheet for Patients:  EntrepreneurPulse.com.au  Fact Sheet for Healthcare Providers:  IncredibleEmployment.be  This test is no t yet approved or cleared by the [place] FDA and  has been authorized for detection and/or diagnosis of SARS-CoV-2 by FDA under an Emergency Use Authorization (EUA). This EUA will remain  in effect (meaning this test can be used) for the duration of the COVID-19 declaration under Section 564(b)(1) of the Act, 21 U.S.C.section 360bbb-3(b)(1), unless the authorization is terminated  or revoked sooner.       Influenza A by PCR NEGATIVE NEGATIVE Final   Influenza B by PCR NEGATIVE NEGATIVE Final    Comment: (NOTE) The Xpert Xpress SARS-CoV-2/FLU/RSV plus assay is intended as an aid in the diagnosis of influenza from Nasopharyngeal swab specimens and should not be used as a sole basis for treatment. Nasal washings and aspirates are unacceptable for Xpert Xpress SARS-CoV-2/FLU/RSV testing.  Fact Sheet for Patients: EntrepreneurPulse.com.au  Fact Sheet for Healthcare Providers: IncredibleEmployment.be  This test is not yet approved or cleared by the [place] FDA and has been authorized for detection and/or diagnosis of SARS-CoV-2 by FDA under an Emergency Use Authorization (EUA). This EUA will remain in effect (meaning this test can be used) for the duration of the COVID-19 declaration under Section 564(b)(1) of the Act, 21 U.S.C. section 360bbb-3(b)(1), unless the authorization is terminated or revoked.  Performed at [facility], [place] [address],  [place], [place] [address]   Urine Culture     Status: None   Collection Time: 08/07/21  5:15 PM   Specimen: Urine, Catheterized  Result Value Ref Range Status   Specimen Description URINE, CATHETERIZED  Final   Special Requests NONE  Final   Culture   Final    NO GROWTH Performed at [facility], [address], [place], [place] [address]    Report Status 08/09/2021 FINAL  Final      Radiology Studies: DG Chest 2 View  Result Date: 08/07/2021 CLINICAL DATA:  Chest pain EXAM: CHEST - 2 VIEW COMPARISON:  08/03/2021 FINDINGS: The heart size and mediastinal contours are within normal limits. Both lungs are clear. Disc degenerative disease of the thoracic spine. IMPRESSION: No acute abnormality of the lungs. Electronically Signed   By: Delanna Ahmadi M.D.   On: 08/07/2021 14:42   CT HEAD CODE STROKE WO CONTRAST  Result Date: 08/08/2021 CLINICAL DATA:  Code stroke. Initial evaluation for acute neuro deficit, stroke suspected. EXAM: CT HEAD WITHOUT CONTRAST TECHNIQUE: Contiguous axial images were obtained from the base of the skull through the vertex without intravenous contrast. RADIATION DOSE REDUCTION: This exam was performed according to the departmental dose-optimization program which includes automated exposure control, adjustment of the mA and/or kV according to patient size and/or use of iterative reconstruction technique. COMPARISON:  Prior study from 11/20/2019. FINDINGS: Brain: Age-related cerebral atrophy with mild chronic small vessel ischemic disease. No acute intracranial hemorrhage or large vessel territory infarct. No mass lesion or midline shift. Mild ventricular prominence related global parenchymal volume loss of hydrocephalus. No extra-axial fluid collection. Vascular: No hyperdense vessel. Calcified atherosclerosis present at the skull base. Skull: Scalp soft tissues and calvarium within normal limits. Sinuses/Orbits: Globes and orbital soft tissues demonstrate no acute  finding. Paranasal sinuses and mastoid air cells are clear. Other: None. ASPECTS [facility] Stroke Program  Early CT Score) - Ganglionic level infarction (caudate, lentiform nuclei, internal capsule, insula, M1-M3 cortex): 7 - Supraganglionic infarction (M4-M6 cortex): 3 Total score (0-10 with 10 being normal): 10 IMPRESSION: 1. No acute intracranial abnormality. 2. ASPECTS is 10. 3. Age-related cerebral atrophy with mild chronic small vessel ischemic disease. These results were communicated to Dr. Leonel Ramsay at 9:41 pm on 08/08/2021 by text page via the [facility] messaging system. Electronically Signed   By: Jeannine Boga M.D.   On: 08/08/2021 21:42    Scheduled Meds:  amLODipine  5 mg Oral Daily   amoxicillin  500 mg Oral BID   citalopram  10 mg Oral Daily   clopidogrel  75 mg Oral Daily   dutasteride  0.5 mg Oral Daily   enoxaparin (LOVENOX) injection  30 mg Subcutaneous Q24H   erythromycin  1 application Right Eye QID   metoprolol tartrate  12.5 mg Oral BID   pravastatin  20 mg Oral Daily   senna-docusate  2 tablet Oral QHS   tamsulosin  0.8 mg Oral QHS   Continuous Infusions:  sodium chloride 75 mL/hr at 08/08/21 1853   sodium chloride 100 mL/hr at 08/09/21 0400     LOS: 0 days   Time spent: 30 minutes  Darliss Cheney, MD [facility]  08/09/2021, 1:09 PM  Please page via [facility] and do not message via secure chat for anything urgent. Secure chat can be used for anything non urgent.  How to contact the [facility] Attending or Consulting provider [place] or covering provider during after hours [place], for this patient?  Check the care team in [facility] and look for a) attending/consulting [facility] provider listed and b) the [facility] team listed. Page or secure chat [phone]. Log into [email or web address] and use [place]'s universal password to access. If you do not have the password, please contact the hospital operator. Locate the [facility] provider you are looking for under [facility] and page to a  number that you can be directly reached. If you still have difficulty reaching the provider, please page the [facility] (Director on Call) for the Hospitalists listed on amion for assistance.

## 2021-08-10 ENCOUNTER — Inpatient Hospital Stay (HOSPITAL_COMMUNITY): Payer: Medicare (Managed Care)

## 2021-08-10 LAB — BASIC METABOLIC PANEL
Anion gap: 13 (ref 5–15)
BUN: 9 mg/dL (ref 8–23)
CO2: 25 mmol/L (ref 22–32)
Calcium: 8 mg/dL — ABNORMAL LOW (ref 8.9–10.3)
Chloride: 100 mmol/L (ref 98–111)
Creatinine, Ser: 0.81 mg/dL (ref 0.61–1.24)
GFR, Estimated: >60 mL/min (ref >60)
Glucose, Bld: 77 mg/dL (ref 70–99)
Potassium: 3.3 mmol/L — ABNORMAL LOW (ref 3.5–5.1)
Sodium: 138 mmol/L (ref 135–145)

## 2021-08-10 MED ORDER — ARTIFICIAL TEARS OPHTHALMIC OINT
TOPICAL_OINTMENT | Freq: Every day | OPHTHALMIC | Status: DC
  Filled 2021-08-10: qty 3.5

## 2021-08-10 MED ORDER — POTASSIUM CHLORIDE 20 MEQ PO PACK
40.0000 meq | PACK | Freq: Once | ORAL | Status: AC
  Administered 2021-08-10: 40 meq via ORAL
  Filled 2021-08-10: qty 2

## 2021-08-10 MED ORDER — POLYVINYL ALCOHOL 1.4 % OP SOLN
2.0000 [drp] | OPHTHALMIC | Status: DC
  Administered 2021-08-10 – 2021-08-13 (×18): 2 [drp] via OPHTHALMIC
  Filled 2021-08-10: qty 15

## 2021-08-10 MED ORDER — AMOXICILLIN 500 MG PO CAPS
500.0000 mg | ORAL_CAPSULE | Freq: Three times a day (TID) | ORAL | Status: DC
  Administered 2021-08-10 – 2021-08-13 (×8): 500 mg via ORAL
  Filled 2021-08-10 (×14): qty 1

## 2021-08-10 MED ORDER — POTASSIUM CHLORIDE CRYS ER 20 MEQ PO TBCR
40.0000 meq | EXTENDED_RELEASE_TABLET | Freq: Once | ORAL | Status: DC
  Filled 2021-08-10: qty 2

## 2021-08-10 MED ORDER — ENSURE ENLIVE PO LIQD
237.0000 mL | Freq: Two times a day (BID) | ORAL | Status: DC
  Administered 2021-08-10 – 2021-08-13 (×6): 237 mL via ORAL

## 2021-08-10 NOTE — Progress Notes (Signed)
PROGRESS NOTE    Jeffrey Zimmerman  KKX:[id number] DOB: 04-12-1936 DOA: 08/07/2021 PCP: [name], MD   Brief Narrative:  Jeffrey Zimmerman is a 86 y.o. male with medical history significant for advanced dementia with physical deconditioning, CAD s/p prior bare-metal stenting, HTN, or BCC s/p excision, hard of hearing who presented to the ED for evaluation of cough, nausea.   Patient recently admitted 08/02/2021-08/05/2021 for acute metabolic encephalopathy secondary to UTI and dehydration.  Urine culture grew multiple species.  Patient was previously on ampicillin for Enterococcus faecalis UTI and transitioned to amoxicillin on discharge for 7 days.  Patient has been active with the [facility] program.  He was ultimately discharged to home with plans to seek placement to assisted living facility on an outpatient basis due to his underlying dementia.  Patient returned to the ED from home via EMS for reported chest pain, cough, nausea.  Upon arrival to ED, he was hemodynamically stable.  He was found to have AKI and dehydration. Urinalysis shows negative nitrites, trace leukocytes, 11-20 RBC/hpf, 21-50 WBC/hpf, few bacteria microscopy.  Urine culture ordered.  SARS-CoV-2 and influenza PCR are negative.  2 view chest x-ray negative for focal consolidation, edema, effusion.  Patient was given 500 cc normal saline x 2 and IV ceftriaxone.  The hospitalist service was consulted to admit for further evaluation and management.  Assessment & Plan:   Principal Problem:   AKI (acute kidney injury) ([place]) Active Problems:   Hypertension   Coronary artery disease   Mixed hyperlipidemia   Alzheimer's dementia with behavioral disturbance (HCC)   BPH (benign prostatic hyperplasia)  AKI/dehydration, POA: Renal function improved.  He is also on the diet now so I will discontinue IV fluids.  Hypokalemia: Replace.   Alzheimer's dementia with behavioral disturbance ([place])- (present on admission) Chronic issue  limiting ability to care for self at home and resulting in repeat admissions.  Also tried as an outpatient.  Likely needs assisted living facility placement.  Seen by PT OT they recommend SNF.  Discussed with his son who is in agreement. -Continue Celexa and delirium precautions -TOC consulted.   BPH (benign prostatic hyperplasia)- (present on admission) Had indwelling Foley catheter on prior admissions, Foley not present on arrival.  Continue tamsulosin and dutasteride.     Mixed hyperlipidemia- (present on admission) Continue pravastatin.   Coronary artery disease- (present on admission): No chest pain.  Has history of bare-metal stenting. -Continue Plavix and pravastatin   Hypertension- (present on admission): Blood pressure controlled. Continue amlodipine, Lopressor.  Hold lisinopril with AKI.   Recent UTI: Continue oral amoxicillin.  Facial weakness: rapid response was called on the night of 08/16/2021 since patient was found to have right facial weakness.  Code stroke was called, patient was seen by neurology.  CT head was done which was unremarkable.  Patient did not have any other focal deficits.  MRI was ordered but patient declined that.  Patient's right-sided weakness is present since admission and this is likely secondary to his recent surgery due to basal cell carcinoma.  Swelling on the right face is improving.  No need of steroids.  He is alert and oriented x2 which is his baseline.  Able to move all extremities and follows commands.  No dysarthria and no dysphagia.  Neurology has suggested talking to patient's ENT physician for possible steroids however I am unable to get a hold of patient's ENT physician over the weekend.  DVT prophylaxis: enoxaparin (LOVENOX) injection 30 mg Start: 08/07/21  2200   Code Status: Full Code  Family Communication:  None present at bedside.   Status is: Observation  The patient will require care spanning > 2 midnights and should be moved to  inpatient because: Needs SNF placement      Estimated body mass index is 25.34 kg/m as calculated from the following:   Height as of 08/03/21: 5\' 8"  (1.727 m).   Weight as of 08/05/21: 75.6 kg.    Nutritional Assessment: There is no height or weight on file to calculate BMI.. Seen by dietician.  I agree with the assessment and plan as outlined below: Nutrition Status: Nutrition Problem: Inadequate oral intake Etiology: chronic illness, decreased appetite Signs/Symptoms: per patient/family report Interventions: Ensure Enlive (each supplement provides 350kcal and 20 grams of protein)  . Skin Assessment: I have examined the patient's skin and I agree with the wound assessment as performed by the wound care RN as outlined below:    Consultants:  None  Procedures:  None  Antimicrobials:  Anti-infectives (From admission, onward)    Start     Dose/Rate Route Frequency Ordered Stop   08/09/21 0930  cefTRIAXone (ROCEPHIN) 1 g in sodium chloride 0.9 % 100 mL IVPB  Status:  Discontinued        1 g 200 mL/hr over 30 Minutes Intravenous Every 24 hours 08/09/21 0832 08/09/21 0838   08/08/21 1000  amoxicillin (AMOXIL) capsule 500 mg        500 mg Oral 2 times daily 08/07/21 2004 08/11/21 0959   08/07/21 2200  amoxicillin (AMOXIL) capsule 500 mg  Status:  Discontinued        500 mg Oral 2 times daily 08/07/21 2003 08/07/21 2004   08/07/21 1815  cefTRIAXone (ROCEPHIN) 1 g in sodium chloride 0.9 % 100 mL IVPB        1 g 200 mL/hr over 30 Minutes Intravenous  Once 08/07/21 1804 08/07/21 2104         Subjective:  Seen and examined.  He has no complaints.  Objective: Vitals:   08/09/21 2330 08/10/21 0341 08/10/21 0400 08/10/21 0800  BP: (!) 151/81 136/68 138/68 (!) 143/76  Pulse: 68 (!) 58 63 (!) 54  Resp: 20 20 13 18   Temp: 98 F (36.7 C)  98.2 F (36.8 C) 97.7 F (36.5 C)  TempSrc: Oral  Oral Oral  SpO2: 96% 92% 94% 93%    Intake/Output Summary (Last 24 hours) at  08/10/2021 1008 Last data filed at 08/10/2021 0500 Gross per 24 hour  Intake --  Output 1900 ml  Net -1900 ml    There were no vitals filed for this visit.  Examination:  General exam: Appears calm and comfortable  Respiratory system: Clear to auscultation. Respiratory effort normal. Cardiovascular system: S1 & S2 heard, RRR. No JVD, murmurs, rubs, gallops or clicks. No pedal edema. Gastrointestinal system: Abdomen is nondistended, soft and nontender. No organomegaly or masses felt. Normal bowel sounds heard. Central nervous system: Alert and oriented x2. No focal neurological deficits. Extremities: Symmetric 5 x 5 power. Skin: No rashes, lesions or ulcers.  Psychiatry: Judgement and insight appear poor  Data Reviewed: I have personally reviewed following labs and imaging studies  CBC: Recent Labs  Lab 08/04/21 0812 08/07/21 1409 08/08/21 0603 08/09/21 0923  WBC 10.4 12.0* 6.6 4.5  NEUTROABS 8.9* 10.0*  --  3.2  HGB 11.5* 14.2 11.2* 11.8*  HCT 35.1* 42.9 35.3* 36.3*  MCV 89.8 90.5 94.4 89.9  PLT 190 331 188 203  Basic Metabolic Panel: Recent Labs  Lab 08/04/21 0812 08/07/21 1409 08/08/21 0603 08/09/21 0923 08/10/21 0833  NA 139 143 143 139 138  K 3.2* 3.7 3.1* 3.5 3.3*  CL 102 106 107 103 100  CO2 23 25 24 27 25   GLUCOSE 105* 152* 82 96 77  BUN 9 27* 24* 12 9  CREATININE 0.78 1.81* 0.96 0.70 0.81  CALCIUM 8.3* 9.3 8.1* 8.3* 8.0*  MG  --  2.0  --  1.7  --     GFR: Estimated Creatinine Clearance: 66.9 mL/min (by C-G formula based on SCr of 0.81 mg/dL). Liver Function Tests: No results for input(s): AST, ALT, ALKPHOS, BILITOT, PROT, ALBUMIN in the last 168 hours.  No results for input(s): LIPASE, AMYLASE in the last 168 hours. No results for input(s): AMMONIA in the last 168 hours. Coagulation Profile: No results for input(s): INR, PROTIME in the last 168 hours. Cardiac Enzymes: No results for input(s): CKTOTAL, CKMB, CKMBINDEX, TROPONINI in the last  168 hours. BNP (last 3 results) No results for input(s): PROBNP in the last 8760 hours. HbA1C: No results for input(s): HGBA1C in the last 72 hours. CBG: Recent Labs  Lab 08/08/21 2107  GLUCAP 99    Lipid Profile: No results for input(s): CHOL, HDL, LDLCALC, TRIG, CHOLHDL, LDLDIRECT in the last 72 hours. Thyroid Function Tests: No results for input(s): TSH, T4TOTAL, FREET4, T3FREE, THYROIDAB in the last 72 hours. Anemia Panel: No results for input(s): VITAMINB12, FOLATE, FERRITIN, TIBC, IRON, RETICCTPCT in the last 72 hours. Sepsis Labs: Recent Labs  Lab 08/03/21 1925  LATICACIDVEN 1.3     Recent Results (from the past 240 hour(s))  Urine Culture     Status: Abnormal   Collection Time: 08/02/21 10:34 AM   Specimen: Urine, Catheterized  Result Value Ref Range Status   Specimen Description URINE, CATHETERIZED  Final   Special Requests   Final    NONE Performed at [facility], [address] [place], [place] [address]    Culture MULTIPLE SPECIES PRESENT, SUGGEST RECOLLECTION (A)  Final   Report Status 08/03/2021 FINAL  Final  Resp Panel by RT-PCR (Flu A&B, Covid) Nasopharyngeal Swab     Status: None   Collection Time: 08/02/21 10:34 AM   Specimen: Nasopharyngeal Swab; Nasopharyngeal(NP) swabs in vial transport medium  Result Value Ref Range Status   SARS Coronavirus 2 by RT PCR NEGATIVE NEGATIVE Final    Comment: (NOTE) SARS-CoV-2 target nucleic acids are NOT DETECTED.  The SARS-CoV-2 RNA is generally detectable in upper respiratory specimens during the acute phase of infection. The lowest concentration of SARS-CoV-2 viral copies this assay can detect is 138 copies/mL. A negative result does not preclude SARS-Cov-2 infection and should not be used as the sole basis for treatment or other patient management decisions. A negative result may occur with  improper specimen collection/handling, submission of specimen other than nasopharyngeal swab, presence of  viral mutation(s) within the areas targeted by this assay, and inadequate number of viral copies(<138 copies/mL). A negative result must be combined with clinical observations, patient history, and epidemiological information. The expected result is Negative.  Fact Sheet for Patients:  EntrepreneurPulse.com.au  Fact Sheet for Healthcare Providers:  IncredibleEmployment.be  This test is no t yet approved or cleared by the [place] FDA and  has been authorized for detection and/or diagnosis of SARS-CoV-2 by FDA under an Emergency Use Authorization (EUA). This EUA will remain  in effect (meaning this test can be used) for the  duration of the COVID-19 declaration under Section 564(b)(1) of the Act, 21 U.S.C.section 360bbb-3(b)(1), unless the authorization is terminated  or revoked sooner.       Influenza A by PCR NEGATIVE NEGATIVE Final   Influenza B by PCR NEGATIVE NEGATIVE Final    Comment: (NOTE) The Xpert Xpress SARS-CoV-2/FLU/RSV plus assay is intended as an aid in the diagnosis of influenza from Nasopharyngeal swab specimens and should not be used as a sole basis for treatment. Nasal washings and aspirates are unacceptable for Xpert Xpress SARS-CoV-2/FLU/RSV testing.  Fact Sheet for Patients: EntrepreneurPulse.com.au  Fact Sheet for Healthcare Providers: IncredibleEmployment.be  This test is not yet approved or cleared by the [place] FDA and has been authorized for detection and/or diagnosis of SARS-CoV-2 by FDA under an Emergency Use Authorization (EUA). This EUA will remain in effect (meaning this test can be used) for the duration of the COVID-19 declaration under Section 564(b)(1) of the Act, 21 U.S.C. section 360bbb-3(b)(1), unless the authorization is terminated or revoked.  Performed at [facility], [place] [address], [place], [place] [address]   Culture, blood (routine x 2)      Status: None   Collection Time: 08/02/21 11:35 AM   Specimen: BLOOD  Result Value Ref Range Status   Specimen Description BLOOD LEFT ANTECUBITAL  Final   Special Requests   Final    BOTTLES DRAWN AEROBIC AND ANAEROBIC Blood Culture adequate volume   Culture   Final    NO GROWTH 5 DAYS Performed at [facility], [place] [address], [place], [place] [address]    Report Status 08/07/2021 FINAL  Final  Culture, blood (routine x 2)     Status: None   Collection Time: 08/02/21 11:40 AM   Specimen: BLOOD RIGHT FOREARM  Result Value Ref Range Status   Specimen Description BLOOD RIGHT FOREARM  Final   Special Requests   Final    BOTTLES DRAWN AEROBIC AND ANAEROBIC Blood Culture adequate volume   Culture   Final    NO GROWTH 5 DAYS Performed at [facility], [place] [address], [place], [place] [address]    Report Status 08/07/2021 FINAL  Final  Resp Panel by RT-PCR (Flu A&B, Covid) Nasopharyngeal Swab     Status: None   Collection Time: 08/07/21  3:12 PM   Specimen: Nasopharyngeal Swab; Nasopharyngeal(NP) swabs in vial transport medium  Result Value Ref Range Status   SARS Coronavirus 2 by RT PCR NEGATIVE NEGATIVE Final    Comment: (NOTE) SARS-CoV-2 target nucleic acids are NOT DETECTED.  The SARS-CoV-2 RNA is generally detectable in upper respiratory specimens during the acute phase of infection. The lowest concentration of SARS-CoV-2 viral copies this assay can detect is 138 copies/mL. A negative result does not preclude SARS-Cov-2 infection and should not be used as the sole basis for treatment or other patient management decisions. A negative result may occur with  improper specimen collection/handling, submission of specimen other than nasopharyngeal swab, presence of viral mutation(s) within the areas targeted by this assay, and inadequate number of viral copies(<138 copies/mL). A negative result must be combined with clinical observations, patient history, and  epidemiological information. The expected result is Negative.  Fact Sheet for Patients:  EntrepreneurPulse.com.au  Fact Sheet for Healthcare Providers:  IncredibleEmployment.be  This test is no t yet approved or cleared by the [place] FDA and  has been authorized for detection and/or diagnosis of SARS-CoV-2 by FDA under an Emergency Use Authorization (EUA). This EUA  will remain  in effect (meaning this test can be used) for the duration of the COVID-19 declaration under Section 564(b)(1) of the Act, 21 U.S.C.section 360bbb-3(b)(1), unless the authorization is terminated  or revoked sooner.       Influenza A by PCR NEGATIVE NEGATIVE Final   Influenza B by PCR NEGATIVE NEGATIVE Final    Comment: (NOTE) The Xpert Xpress SARS-CoV-2/FLU/RSV plus assay is intended as an aid in the diagnosis of influenza from Nasopharyngeal swab specimens and should not be used as a sole basis for treatment. Nasal washings and aspirates are unacceptable for Xpert Xpress SARS-CoV-2/FLU/RSV testing.  Fact Sheet for Patients: EntrepreneurPulse.com.au  Fact Sheet for Healthcare Providers: IncredibleEmployment.be  This test is not yet approved or cleared by the [place] FDA and has been authorized for detection and/or diagnosis of SARS-CoV-2 by FDA under an Emergency Use Authorization (EUA). This EUA will remain in effect (meaning this test can be used) for the duration of the COVID-19 declaration under Section 564(b)(1) of the Act, 21 U.S.C. section 360bbb-3(b)(1), unless the authorization is terminated or revoked.  Performed at [facility], [place] [address] [place], [place] [address]   Urine Culture     Status: None   Collection Time: 08/07/21  5:15 PM   Specimen: Urine, Catheterized  Result Value Ref Range Status   Specimen Description URINE, CATHETERIZED  Final   Special Requests NONE  Final   Culture    Final    NO GROWTH Performed at [facility], [address] [place], [place] [address]    Report Status 08/09/2021 FINAL  Final      Radiology Studies: CT HEAD CODE STROKE WO CONTRAST  Result Date: 08/08/2021 CLINICAL DATA:  Code stroke. Initial evaluation for acute neuro deficit, stroke suspected. EXAM: CT HEAD WITHOUT CONTRAST TECHNIQUE: Contiguous axial images were obtained from the base of the skull through the vertex without intravenous contrast. RADIATION DOSE REDUCTION: This exam was performed according to the departmental dose-optimization program which includes automated exposure control, adjustment of the mA and/or kV according to patient size and/or use of iterative reconstruction technique. COMPARISON:  Prior study from 11/20/2019. FINDINGS: Brain: Age-related cerebral atrophy with mild chronic small vessel ischemic disease. No acute intracranial hemorrhage or large vessel territory infarct. No mass lesion or midline shift. Mild ventricular prominence related global parenchymal volume loss of hydrocephalus. No extra-axial fluid collection. Vascular: No hyperdense vessel. Calcified atherosclerosis present at the skull base. Skull: Scalp soft tissues and calvarium within normal limits. Sinuses/Orbits: Globes and orbital soft tissues demonstrate no acute finding. Paranasal sinuses and mastoid air cells are clear. Other: None. ASPECTS [facility] Stroke Program Early CT Score) - Ganglionic level infarction (caudate, lentiform nuclei, internal capsule, insula, M1-M3 cortex): 7 - Supraganglionic infarction (M4-M6 cortex): 3 Total score (0-10 with 10 being normal): 10 IMPRESSION: 1. No acute intracranial abnormality. 2. ASPECTS is 10. 3. Age-related cerebral atrophy with mild chronic small vessel ischemic disease. These results were communicated to Dr. Leonel Ramsay at 9:41 pm on 08/08/2021 by text page via the [facility] messaging system. Electronically Signed   By: Jeannine Boga M.D.   On:  08/08/2021 21:42    Scheduled Meds:  amLODipine  5 mg Oral Daily   amoxicillin  500 mg Oral BID   citalopram  10 mg Oral Daily   clopidogrel  75 mg Oral Daily   dutasteride  0.5 mg Oral Daily   enoxaparin (LOVENOX) injection  30 mg Subcutaneous Q24H   erythromycin  1 application Right Eye QID   feeding supplement  237 mL Oral BID BM   metoprolol tartrate  12.5 mg Oral BID   pravastatin  20 mg Oral Daily   senna-docusate  2 tablet Oral QHS   tamsulosin  0.8 mg Oral QHS   Continuous Infusions:  sodium chloride 100 mL/hr at 08/09/21 2313     LOS: 1 day   Time spent: 26 minutes  Darliss Cheney, MD [facility]  08/10/2021, 10:08 AM  Please page via [facility] and do not message via secure chat for anything urgent. Secure chat can be used for anything non urgent.  How to contact the [facility] Attending or Consulting provider [place] or covering provider during after hours [place], for this patient?  Check the care team in [facility] and look for a) attending/consulting [facility] provider listed and b) the [facility] team listed. Page or secure chat [phone]. Log into [email or web address] and use [place] universal password to access. If you do not have the password, please contact the hospital operator. Locate the [facility] provider you are looking for under [facility] and page to a number that you can be directly reached. If you still have difficulty reaching the provider, please page the [facility] (Director on Call) for the Hospitalists listed on amion for assistance.

## 2021-08-10 NOTE — Progress Notes (Signed)
Neurology Progress Note  Brief HPI: Neurology consulted on 08/08/21 for right facial weakness. Code stroke was negative. Imaging negative for stroke. Patient had recent resection of right cheek SCCA and had a right facial droop since then. Patient refused MRI brain. Swelling noted on right face as well, but this has decreased since admission. Steroids were not ordered and suggested ENT f/up.   S: Patient says he feels well. He has no complaints.  O: Current vital signs: BP (!) 143/76 (BP Location: Left Arm)    Pulse (!) 54    Temp 97.7 F (36.5 C) (Oral)    Resp 18    SpO2 93%  Vital signs in last 24 hours: Temp:  [97.7 F (36.5 C)-98.2 F (36.8 C)] 97.7 F (36.5 C) (01/22 0800) Pulse Rate:  [54-68] 54 (01/22 0800) Resp:  [13-20] 18 (01/22 0800) BP: (136-151)/(68-82) 143/76 (01/22 0800) SpO2:  [92 %-97 %] 93 % (01/22 0800)  GENERAL: Chronically ill appearing elderly male. Awake, alert in NAD. HEENT: Normocephalic and atraumatic. Right eye conjunctiva is reddened with yellow exudate.  LUNGS: Normal respiratory effort.  CV: RRR on tele.  Ext: warm.  NEURO:  Mental Status: Alert . Oriented to self, place, year, and month. Follows commands.   Speech/Language: speech is without aphasia or dysarthria.  Naming, repetition, fluency, and bradyphrenic.   Cranial Nerves:  Tracks NP. Sensation intact to face and extremities. Right facial droop noted with slight edema to cheek and surgical scab to right cheek, no drainage.  Motor:  Moves all 4 extremities on command and spontaneously. UE strength is 4/5. He is able to lift his legs and wiggle his toes.  Tone: is normal and bulk is normal. Gait- deferred.  Medications  Current Facility-Administered Medications:    acetaminophen (TYLENOL) tablet 650 mg, 650 mg, Oral, Q6H PRN **OR** acetaminophen (TYLENOL) suppository 650 mg, 650 mg, Rectal, Q6H PRN, Posey Pronto, Vishal R, MD   amLODipine (NORVASC) tablet 5 mg, 5 mg, Oral, Daily, Shela Leff, MD, 5 mg at 08/10/21 1035   amoxicillin (AMOXIL) capsule 500 mg, 500 mg, Oral, BID, Zada Finders R, MD, 500 mg at 08/10/21 1036   citalopram (CELEXA) tablet 10 mg, 10 mg, Oral, Daily, Zada Finders R, MD, 10 mg at 08/10/21 1035   clopidogrel (PLAVIX) tablet 75 mg, 75 mg, Oral, Daily, Zada Finders R, MD, 75 mg at 08/10/21 1036   dutasteride (AVODART) capsule 0.5 mg, 0.5 mg, Oral, Daily, Posey Pronto, Roxanne Mins R, MD, 0.5 mg at 08/10/21 1037   enoxaparin (LOVENOX) injection 30 mg, 30 mg, Subcutaneous, Q24H, Posey Pronto, Vishal R, MD, 30 mg at 08/09/21 2249   erythromycin ophthalmic ointment 1 application, 1 application, Right Eye, QID, Lenore Cordia, MD, 1 application at 48/54/62 1037   feeding supplement (ENSURE ENLIVE / ENSURE PLUS) liquid 237 mL, 237 mL, Oral, BID BM, Pahwani, Ravi, MD, 237 mL at 08/10/21 1037   lactase (LACTAID) tablet 3,000-6,000 Units, 3,000-6,000 Units, Oral, TID PRN, Lenore Cordia, MD   metoprolol tartrate (LOPRESSOR) tablet 12.5 mg, 12.5 mg, Oral, BID, Posey Pronto, Vishal R, MD, 12.5 mg at 08/10/21 1037   ondansetron (ZOFRAN) tablet 4 mg, 4 mg, Oral, Q6H PRN **OR** ondansetron (ZOFRAN) injection 4 mg, 4 mg, Intravenous, Q6H PRN, Patel, Vishal R, MD   polyethylene glycol (MIRALAX / GLYCOLAX) packet 17 g, 17 g, Oral, Daily PRN, Posey Pronto, Vishal R, MD   potassium chloride (KLOR-CON) packet 40 mEq, 40 mEq, Oral, Once, Pahwani, Ravi, MD   pravastatin (PRAVACHOL) tablet 20 mg, 20  mg, Oral, Daily, Zada Finders R, MD, 20 mg at 08/10/21 1035   senna-docusate (Senokot-S) tablet 2 tablet, 2 tablet, Oral, QHS, Zada Finders R, MD, 2 tablet at 08/09/21 2250   tamsulosin (FLOMAX) capsule 0.8 mg, 0.8 mg, Oral, QHS, Patel, Vishal R, MD, 0.8 mg at 08/09/21 2245  No new pertinent labs  New CTH-appears same as previous imaging, but awaiting final read.   Assessment: 86 yo who came in with right facial droop. CTH showed no acute finding. Patient refused MRI. 2nd [place] appears same as 1st. His facial  sweling has improved and believe the facial weakness is secondary to his recent facial SCCA surgery.   Recommendations/Plan:  -We will f/up read on 2nd [place].  -We will sign off and be available for questions.   ++NP chatted attending to let him know about right eye redness and discharge.   Pt seen by Clance Boll, MSN, APN-BC/Nurse Practitioner/Neuro and later by MD. Note and plan to be edited as needed by MD.  Pager: [phone]   NEUROHOSPITALIST ADDENDUM Performed a face to face diagnostic evaluation.   I have reviewed the contents of history and physical exam as documented by PA/ARNP/Resident and agree with above documentation.  I have discussed and formulated the above plan as documented. Edits to the note have been made as needed.  Impression/Key exam findings/Plan: Persistent R upper and lower facial droop. CTH and repeat CTH is negative. On discussion with patient, this has been going on for about 3 months now. Spoke to hospitalist team and they agree that this was present during the initial evaluation prior to activating stroke code.  I suspect that this is likely residual R facial droop from his surgery. Of note, his R eye looks red and suspect some eye dryness. Will order lubricating eye drops and ointment to prevent corneal clouding. Will put in order to tape his eye shut overnight. Can reach out to ophthalmology for further recs as he does have some yellow crusting around his R eye.  We will signoff. Please feel free to contact us with any questions or concerns.   Donnetta Simpers, MD [facility] [phone]   If 7pm to 7am, please call on call as listed on AMION.

## 2021-08-10 NOTE — Progress Notes (Signed)
Initial Nutrition Assessment  DOCUMENTATION CODES:   Not applicable  INTERVENTION:   Ensure Enlive po BID, each supplement provides 350 kcal and 20 grams of protein  Need weight for this admission  NUTRITION DIAGNOSIS:   Inadequate oral intake related to chronic illness, decreased appetite as evidenced by per patient/family report.  GOAL:   Patient will meet greater than or equal to 90% of their needs  MONITOR:   PO intake, Supplement acceptance, Labs, Weight trends  REASON FOR ASSESSMENT:   Malnutrition Screening Tool    ASSESSMENT:   86 yo male admitted with AKI/dehydration. 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   SLP evaluated and pt appropriate for Regular diet with Thin Liquids . No recorded po intake.   No weight for this admission  Labs: reviewed Meds: lactaid prn  NUTRITION - FOCUSED PHYSICAL EXAM:  Unable to assess  Diet Order:   Diet Order             Diet regular Room service appropriate? Yes; Fluid consistency: Thin  Diet effective now                   EDUCATION NEEDS:   Not appropriate for education at this time  Skin:  Skin Assessment: Reviewed RN Assessment  Last BM:  1/20  Height:   Ht Readings from Last 1 Encounters:  08/03/21 5\' 8"  (1.727 m)    Weight:   Wt Readings from Last 1 Encounters:  08/05/21 75.6 kg     BMI:  There is no height or weight on file to calculate BMI.  Estimated Nutritional Needs:   Kcal:  1750-1950 kcals  Protein:  85-95 g  Fluid:  >/= 1.7 L  Kerman Passey MS, RDN, LDN, CNSC Registered Dietitian III Clinical Nutrition RD Pager and On-Call Pager Number Located in [place]

## 2021-08-11 LAB — BASIC METABOLIC PANEL
Anion gap: 12 (ref 5–15)
BUN: 12 mg/dL (ref 8–23)
CO2: 23 mmol/L (ref 22–32)
Calcium: 8.3 mg/dL — ABNORMAL LOW (ref 8.9–10.3)
Chloride: 102 mmol/L (ref 98–111)
Creatinine, Ser: 0.77 mg/dL (ref 0.61–1.24)
GFR, Estimated: >60 mL/min (ref >60)
Glucose, Bld: 69 mg/dL — ABNORMAL LOW (ref 70–99)
Potassium: 3.5 mmol/L (ref 3.5–5.1)
Sodium: 137 mmol/L (ref 135–145)

## 2021-08-11 MED ORDER — ARTIFICIAL TEARS OPHTHALMIC OINT
TOPICAL_OINTMENT | Freq: Four times a day (QID) | OPHTHALMIC | Status: DC
  Filled 2021-08-11: qty 3.5

## 2021-08-11 NOTE — Consult Note (Signed)
Ophthalmology Consult  Jeffrey Zimmerman is a 86 y.o. male with medical history significant for advanced dementia with physical deconditioning, CAD s/p prior bare-metal stenting, HTN, or BCC s/p excision, hard of hearing and Right side Bells Palsy for past 3 months s/p parotid tumor removal.  Pt has discharge from the right eye and pain.  Pt recently started on Erythromycin ointment qid and artificial tears.    On exam pt was 20/200 in both eyes though difficult to assess.  Pt states has cataracts but hasn't been back to the eye doctor in years.  Pt also says no longer wears his glasses.  Pt can see TV with both eyes but can't read captions which patient states is his baseline.  IOP soft to palpation and equal in both eyes.  Extraocular motility intact with full ductions and versions.  PERRL.    Pt with lagophthalmus and poor blink in the right eye. Pt with chemosis inferiorly and right lower lid ectropion.  Cornea clear with no epithelial defect or ulceration.  Left eye anterior segment wnl.  Pt with dense cataracts in both eyes.  The retina is flat and intact in both eyes but difficult view based on cataracts and hospital setting.    A/P Bells Palsy right side.  Pt to use Erythromycin ointment qid and Lacrilube qid alternating so getting ointment q 2hours.  Pt needs to see an oculoplastic specialist for possible tarsorrhaphy or gold weight to reduce exposure keratopathy.  I will check with pt on Wednesday to ensure stable. Cataracts OU:  observe for now.    Thank you for allowing me to participate in the care of this patient.  Please feel freee to contact me if you have any concerns.  Darleen Crocker, M.D. (Cell) [phone] (Office) [phone]

## 2021-08-11 NOTE — Progress Notes (Signed)
Occupational Therapy Treatment Patient Details Name: Jeffrey Zimmerman MRN: [id number] DOB: Aug 12, 1935 Today's Date: 08/11/2021   History of present illness Pt is a 86 y/o male presenting on 08/07/21 with cough, nausea. Recent admission 1/14-1/17 with AMS due to UTI/dehydration. Found with AKI. PMH includes: advanced dementia with behavioral disturbance, CAD, HTN, hard of hearing, gout, IBS, MI, colon cancer.   OT comments  Patient received in bed and agreeable to OT session.  Patient was min assist for bed mobility and demonstrated improvement with transfers from +2 to mod assist of one. Patient performed self care tasks seated due to unable to stand without BUE support. Patient to attempt self care standing at sink next session. Acute OT to continue to follow.    Recommendations for follow up therapy are one component of a multi-disciplinary discharge planning process, led by the attending physician.  Recommendations may be updated based on patient status, additional functional criteria and insurance authorization.    Follow Up Recommendations  Skilled nursing-short term rehab (<3 hours/day)    Assistance Recommended at Discharge Frequent or constant Supervision/Assistance  Patient can return home with the following  A lot of help with walking and/or transfers;A lot of help with bathing/dressing/bathroom;Direct supervision/assist for medications management;Direct supervision/assist for financial management;Assist for transportation;Help with stairs or ramp for entrance;Assistance with cooking/housework   Equipment Recommendations  Other (comment) (TBD)    Recommendations for Other Services      Precautions / Restrictions Precautions Precautions: Fall Restrictions Weight Bearing Restrictions: No       Mobility Bed Mobility Overal bed mobility: Needs Assistance Bed Mobility: Supine to Sit     Supine to sit: Min assist     General bed mobility comments: assistance to scoot hips to  EOB    Transfers Overall transfer level: Needs assistance Equipment used: Rolling walker (2 wheels) Transfers: Bed to chair/wheelchair/BSC Sit to Stand: Mod assist     Step pivot transfers: Mod assist     General transfer comment: verbal cues for hand placement.  Mod assist to power up and maneuver walker     Balance Overall balance assessment: Needs assistance Sitting-balance support: No upper extremity supported, Feet supported Sitting balance-Leahy Scale: Fair Sitting balance - Comments: able to sit on EOB without assistance for balance   Standing balance support: Bilateral upper extremity supported, During functional activity Standing balance-Leahy Scale: Poor Standing balance comment: reliant on RW                           ADL either performed or assessed with clinical judgement   ADL Overall ADL's : Needs assistance/impaired     Grooming: Set up;Sitting Grooming Details (indicate cue type and reason): performed sitting in recliner Upper Body Bathing: Minimal assistance;Sitting Upper Body Bathing Details (indicate cue type and reason): performed seated in recliner     Upper Body Dressing : Minimal assistance;Sitting Upper Body Dressing Details (indicate cue type and reason): changed gown Lower Body Dressing: Moderate assistance;Sitting/lateral leans Lower Body Dressing Details (indicate cue type and reason): able to doff socks and donn right sock, unable to donn left               General ADL Comments: self care performed at recliner    Extremity/Trunk Assessment              Vision       Perception     Praxis      Cognition Arousal/Alertness: Awake/alert Behavior  During Therapy: WFL for tasks assessed/performed Overall Cognitive Status: History of cognitive impairments - at baseline                                 General Comments: HOH, followed commands well and oriented        Exercises      Shoulder  Instructions       General Comments      Pertinent Vitals/ Pain       Pain Assessment Pain Assessment: Faces Faces Pain Scale: No hurt  Home Living                                          Prior Functioning/Environment              Frequency  Min 2X/week        Progress Toward Goals  OT Goals(current goals can now be found in the care plan section)  Progress towards OT goals: Progressing toward goals  Acute Rehab OT Goals Patient Stated Goal: get better OT Goal Formulation: With patient Time For Goal Achievement: 08/22/21 Potential to Achieve Goals: Good ADL Goals Pt Will Perform Grooming: standing;with min guard assist Pt Will Perform Lower Body Dressing: with min assist;sit to/from stand Pt Will Transfer to Toilet: with min assist;bedside commode;ambulating Pt Will Perform Toileting - Clothing Manipulation and hygiene: with min assist;sit to/from stand  Plan Discharge plan remains appropriate    Co-evaluation                 AM-PAC OT "6 Clicks" Daily Activity     Outcome Measure   Help from another person eating meals?: A Little Help from another person taking care of personal grooming?: A Little Help from another person toileting, which includes using toliet, bedpan, or urinal?: Total Help from another person bathing (including washing, rinsing, drying)?: A Lot Help from another person to put on and taking off regular upper body clothing?: A Little Help from another person to put on and taking off regular lower body clothing?: A Lot 6 Click Score: 14    End of Session Equipment Utilized During Treatment: Gait belt;Rolling walker (2 wheels)  OT Visit Diagnosis: Other abnormalities of gait and mobility (R26.89);Muscle weakness (generalized) (M62.81);Other symptoms and signs involving cognitive function   Activity Tolerance Patient tolerated treatment well   Patient Left in chair;with call bell/phone within reach;with chair  alarm set   Nurse Communication Mobility status        Time: 8616-8372 OT Time Calculation (min): 29 min  Charges: OT General Charges $OT Visit: 1 Visit OT Treatments $Self Care/Home Management : 23-37 mins  Lodema Hong, [place]  Pager [phone] Office [place] 08/11/2021, 9:29 AM

## 2021-08-11 NOTE — TOC Progression Note (Addendum)
Transition of Care [facility]) - Progression Note    Patient Details  Name: Jeffrey Zimmerman MRN: [id number] Date of Birth: 11-04-1935  Transition of Care [facility]) CM/SW [place], LCSW Phone Number: 08/11/2021, 8:38 AM  Clinical Narrative:    8:38am-CSW requested [facility] review referral as requested by [facility].   3pm-[facility] unable to accept patient. [facility] able to accept patient tomorrow if ready. CSW confirmed plan with Marcie Bal at [facility] and they will be able to provide transportation to SNF. Requested COVID test from MD.   Expected Discharge Plan: [place] Barriers to Discharge: SNF Pending bed offer  Expected Discharge Plan and Services Expected Discharge Plan: [place] In-house Referral: Clinical Social Work   Post Acute Care Choice: [place] Living arrangements for the past 2 months: [place] Determinants of Health (SDOH) Interventions    Readmission Risk Interventions Readmission Risk Prevention Plan 08/04/2021  Transportation Screening Complete  Medication Review Press photographer) Complete  PCP or Specialist appointment within 3-5 days of discharge Not Complete  PCP/Specialist Appt Not Complete comments [place] not medically ready for discharge.  [place] or Home Care Consult Complete  SW Recovery Care/Counseling Consult Complete  Palliative Care Screening Not Applicable  Skilled Nursing Facility Not Applicable  Some recent data might be hidden

## 2021-08-11 NOTE — Progress Notes (Signed)
PROGRESS NOTE    Jeffrey Zimmerman  DPO:[id number] DOB: January 31, 1936 DOA: 08/07/2021 PCP: [name], MD   Brief Narrative:  Jeffrey Zimmerman is a 86 y.o. male with medical history significant for advanced dementia with physical deconditioning, CAD s/p prior bare-metal stenting, HTN, or BCC s/p excision, hard of hearing who presented to the ED for evaluation of cough, nausea.   Patient recently admitted 08/02/2021-08/05/2021 for acute metabolic encephalopathy secondary to UTI and dehydration.  Urine culture grew multiple species.  Patient was previously on ampicillin for Enterococcus faecalis UTI and transitioned to amoxicillin on discharge for 7 days.  Patient has been active with the [facility].  He was ultimately discharged to home with plans to seek placement to assisted living facility on an outpatient basis due to his underlying dementia.  Patient returned to the ED from home via EMS for reported chest pain, cough, nausea.  Upon arrival to ED, he was hemodynamically stable.  He was found to have AKI and dehydration. Urinalysis shows negative nitrites, trace leukocytes, 11-20 RBC/hpf, 21-50 WBC/hpf, few bacteria microscopy.  Urine culture ordered.  SARS-CoV-2 and influenza PCR are negative.  2 view chest x-ray negative for focal consolidation, edema, effusion.  Patient was given 500 cc normal saline x 2 and IV ceftriaxone.  The hospitalist service was consulted to admit for further evaluation and management.  Assessment & Plan:   Principal Problem:   AKI (acute kidney injury) ([place]) Active Problems:   Hypertension   Coronary artery disease   Mixed hyperlipidemia   Alzheimer's dementia with behavioral disturbance (HCC)   BPH (benign prostatic hyperplasia)  AKI/dehydration, POA: Renal function improved.  Hypokalemia: Resolved.    Alzheimer's dementia with behavioral disturbance ([place])- (present on admission) Chronic issue limiting ability to care for self at home and resulting in  repeat admissions.  Also tried as an outpatient.  Likely needs assisted living facility placement.  Seen by PT OT they recommend SNF.  Discussed with his son who is in agreement. -Continue Celexa and delirium precautions -TOC consulted.   BPH (benign prostatic hyperplasia)- (present on admission) Had indwelling Foley catheter on prior admissions, Foley not present on arrival.  I was just informed by the pharmacy that he was recently transition from Flomax to finasteride.  I will discontinue Flomax.  Continue dutasteride.     Mixed hyperlipidemia- (present on admission) Continue pravastatin.   Coronary artery disease- (present on admission): No chest pain.  Has history of bare-metal stenting. -Continue Plavix and pravastatin   Hypertension- (present on admission): Blood pressure controlled. Continue amlodipine, Lopressor.  Hold lisinopril with AKI.   Recent UTI: Continue oral amoxicillin.  Facial weakness: His right-sided facial droop and weakness and inability to close his right eye is going on for about 3 months.  CT head x2 negative.  Patient declines MRI.  There is some purulent discharge from the right eye, not sure if this is infected or not.  Lubricant as well as closing the eye at night with tape has been ordered by neurology yesterday.  I have consulted Dr. Talbert Forest of ophthalmology to assess him for any ocular infection.  Patient denies any issues with the vision.  DVT prophylaxis: enoxaparin (LOVENOX) injection 30 mg Start: 08/07/21 2200   Code Status: Full Code  Family Communication:  None present at bedside.   Status is: Inpatient  Remains inpatient appropriate because: Waiting for SNF bed availability.        Estimated body mass index is 25.34 kg/m as calculated  from the following:   Height as of 08/03/21: 5\' 8"  (1.727 m).   Weight as of 08/05/21: 75.6 kg.    Nutritional Assessment: There is no height or weight on file to calculate BMI.. Seen by dietician.  I agree  with the assessment and plan as outlined below: Nutrition Status: Nutrition Problem: Inadequate oral intake Etiology: chronic illness, decreased appetite Signs/Symptoms: per patient/family report Interventions: Ensure Enlive (each supplement provides 350kcal and 20 grams of protein)  . Skin Assessment: I have examined the patient's skin and I agree with the wound assessment as performed by the wound care RN as outlined below:    Consultants:  Neurology Ophthalmology  Procedures:  None  Antimicrobials:  Anti-infectives (From admission, onward)    Start     Dose/Rate Route Frequency Ordered Stop   08/10/21 2200  amoxicillin (AMOXIL) capsule 500 mg        500 mg Oral Every 8 hours 08/10/21 1506     08/09/21 0930  cefTRIAXone (ROCEPHIN) 1 g in sodium chloride 0.9 % 100 mL IVPB  Status:  Discontinued        1 g 200 mL/hr over 30 Minutes Intravenous Every 24 hours 08/09/21 0832 08/09/21 0838   08/08/21 1000  amoxicillin (AMOXIL) capsule 500 mg  Status:  Discontinued        500 mg Oral 2 times daily 08/07/21 2004 08/10/21 1506   08/07/21 2200  amoxicillin (AMOXIL) capsule 500 mg  Status:  Discontinued        500 mg Oral 2 times daily 08/07/21 2003 08/07/21 2004   08/07/21 1815  cefTRIAXone (ROCEPHIN) 1 g in sodium chloride 0.9 % 100 mL IVPB        1 g 200 mL/hr over 30 Minutes Intravenous  Once 08/07/21 1804 08/07/21 2104         Subjective:  Patient seen and examined.  He has no complaints.  Objective: Vitals:   08/10/21 2314 08/11/21 0414 08/11/21 0749 08/11/21 1212  BP: (!) 146/70 (!) 144/76 (!) 150/71 131/67  Pulse: 62 63 69 71  Resp: 20 18    Temp: 98 F (36.7 C) 98.5 F (36.9 C) 97.7 F (36.5 C) 98.1 F (36.7 C)  TempSrc: Oral Oral Oral Oral  SpO2: 98% 96% 95% 96%    Intake/Output Summary (Last 24 hours) at 08/11/2021 1343 Last data filed at 08/10/2021 1855 Gross per 24 hour  Intake --  Output 900 ml  Net -900 ml    There were no vitals filed for this  visit.  Examination:  General exam: Appears calm and comfortable  Respiratory system: Clear to auscultation. Respiratory effort normal. Cardiovascular system: S1 & S2 heard, RRR. No JVD, murmurs, rubs, gallops or clicks. No pedal edema. Gastrointestinal system: Abdomen is nondistended, soft and nontender. No organomegaly or masses felt. Normal bowel sounds heard. Central nervous system: Alert and oriented x2.  Right facial droop.  Inability to close right eye completely. Extremities: Symmetric 5 x 5 power. Skin: No rashes, lesions or ulcers.  Psychiatry: Judgement and insight appear poor.  \ Data Reviewed: I have personally reviewed following labs and imaging studies  CBC: Recent Labs  Lab 08/07/21 1409 08/08/21 0603 08/09/21 0923  WBC 12.0* 6.6 4.5  NEUTROABS 10.0*  --  3.2  HGB 14.2 11.2* 11.8*  HCT 42.9 35.3* 36.3*  MCV 90.5 94.4 89.9  PLT 331 188 413    Basic Metabolic Panel: Recent Labs  Lab 08/07/21 1409 08/08/21 0603 08/09/21 0923 08/10/21 2440 08/11/21  0025  NA 143 143 139 138 137  K 3.7 3.1* 3.5 3.3* 3.5  CL 106 107 103 100 102  CO2 25 24 27 25 23   GLUCOSE 152* 82 96 77 69*  BUN 27* 24* 12 9 12   CREATININE 1.81* 0.96 0.70 0.81 0.77  CALCIUM 9.3 8.1* 8.3* 8.0* 8.3*  MG 2.0  --  1.7  --   --     GFR: Estimated Creatinine Clearance: 67.7 mL/min (by C-G formula based on SCr of 0.77 mg/dL). Liver Function Tests: No results for input(s): AST, ALT, ALKPHOS, BILITOT, PROT, ALBUMIN in the last 168 hours.  No results for input(s): LIPASE, AMYLASE in the last 168 hours. No results for input(s): AMMONIA in the last 168 hours. Coagulation Profile: No results for input(s): INR, PROTIME in the last 168 hours. Cardiac Enzymes: No results for input(s): CKTOTAL, CKMB, CKMBINDEX, TROPONINI in the last 168 hours. BNP (last 3 results) No results for input(s): PROBNP in the last 8760 hours. HbA1C: No results for input(s): HGBA1C in the last 72 hours. CBG: Recent  Labs  Lab 08/08/21 2107  GLUCAP 99    Lipid Profile: No results for input(s): CHOL, HDL, LDLCALC, TRIG, CHOLHDL, LDLDIRECT in the last 72 hours. Thyroid Function Tests: No results for input(s): TSH, T4TOTAL, FREET4, T3FREE, THYROIDAB in the last 72 hours. Anemia Panel: No results for input(s): VITAMINB12, FOLATE, FERRITIN, TIBC, IRON, RETICCTPCT in the last 72 hours. Sepsis Labs: No results for input(s): PROCALCITON, LATICACIDVEN in the last 168 hours.   Recent Results (from the past 240 hour(s))  Urine Culture     Status: Abnormal   Collection Time: 08/02/21 10:34 AM   Specimen: Urine, Catheterized  Result Value Ref Range Status   Specimen Description URINE, CATHETERIZED  Final   Special Requests   Final    NONE Performed at [facility], [address], [place], [place] [address]    Culture MULTIPLE SPECIES PRESENT, SUGGEST RECOLLECTION (A)  Final   Report Status 08/03/2021 FINAL  Final  Resp Panel by RT-PCR (Flu A&B, Covid) Nasopharyngeal Swab     Status: None   Collection Time: 08/02/21 10:34 AM   Specimen: Nasopharyngeal Swab; Nasopharyngeal(NP) swabs in vial transport medium  Result Value Ref Range Status   SARS Coronavirus 2 by RT PCR NEGATIVE NEGATIVE Final    Comment: (NOTE) SARS-CoV-2 target nucleic acids are NOT DETECTED.  The SARS-CoV-2 RNA is generally detectable in upper respiratory specimens during the acute phase of infection. The lowest concentration of SARS-CoV-2 viral copies this assay can detect is 138 copies/mL. A negative result does not preclude SARS-Cov-2 infection and should not be used as the sole basis for treatment or other patient management decisions. A negative result may occur with  improper specimen collection/handling, submission of specimen other than nasopharyngeal swab, presence of viral mutation(s) within the areas targeted by this assay, and inadequate number of viral copies(<138 copies/mL). A negative result must be combined  with clinical observations, patient history, and epidemiological information. The expected result is Negative.  Fact Sheet for Patients:  EntrepreneurPulse.com.au  Fact Sheet for Healthcare Providers:  IncredibleEmployment.be  This test is no t yet approved or cleared by the [place] FDA and  has been authorized for detection and/or diagnosis of SARS-CoV-2 by FDA under an Emergency Use Authorization (EUA). This EUA will remain  in effect (meaning this test can be used) for the duration of the COVID-19 declaration under Section 564(b)(1) of the Act, 21 U.S.C.section 360bbb-3(b)(1), unless the  authorization is terminated  or revoked sooner.       Influenza A by PCR NEGATIVE NEGATIVE Final   Influenza B by PCR NEGATIVE NEGATIVE Final    Comment: (NOTE) The Xpert Xpress SARS-CoV-2/FLU/RSV plus assay is intended as an aid in the diagnosis of influenza from Nasopharyngeal swab specimens and should not be used as a sole basis for treatment. Nasal washings and aspirates are unacceptable for Xpert Xpress SARS-CoV-2/FLU/RSV testing.  Fact Sheet for Patients: EntrepreneurPulse.com.au  Fact Sheet for Healthcare Providers: IncredibleEmployment.be  This test is not yet approved or cleared by the [place] FDA and has been authorized for detection and/or diagnosis of SARS-CoV-2 by FDA under an Emergency Use Authorization (EUA). This EUA will remain in effect (meaning this test can be used) for the duration of the COVID-19 declaration under Section 564(b)(1) of the Act, 21 U.S.C. section 360bbb-3(b)(1), unless the authorization is terminated or revoked.  Performed at [facility], [place] [address] [place], [place] [address]   Culture, blood (routine x 2)     Status: None   Collection Time: 08/02/21 11:35 AM   Specimen: BLOOD  Result Value Ref Range Status   Specimen Description BLOOD LEFT ANTECUBITAL   Final   Special Requests   Final    BOTTLES DRAWN AEROBIC AND ANAEROBIC Blood Culture adequate volume   Culture   Final    NO GROWTH 5 DAYS Performed at [facility], [place] [address] [place], [place] [address]    Report Status 08/07/2021 FINAL  Final  Culture, blood (routine x 2)     Status: None   Collection Time: 08/02/21 11:40 AM   Specimen: BLOOD RIGHT FOREARM  Result Value Ref Range Status   Specimen Description BLOOD RIGHT FOREARM  Final   Special Requests   Final    BOTTLES DRAWN AEROBIC AND ANAEROBIC Blood Culture adequate volume   Culture   Final    NO GROWTH 5 DAYS Performed at [facility], [place] [address], [place], [place] [address]    Report Status 08/07/2021 FINAL  Final  Resp Panel by RT-PCR (Flu A&B, Covid) Nasopharyngeal Swab     Status: None   Collection Time: 08/07/21  3:12 PM   Specimen: Nasopharyngeal Swab; Nasopharyngeal(NP) swabs in vial transport medium  Result Value Ref Range Status   SARS Coronavirus 2 by RT PCR NEGATIVE NEGATIVE Final    Comment: (NOTE) SARS-CoV-2 target nucleic acids are NOT DETECTED.  The SARS-CoV-2 RNA is generally detectable in upper respiratory specimens during the acute phase of infection. The lowest concentration of SARS-CoV-2 viral copies this assay can detect is 138 copies/mL. A negative result does not preclude SARS-Cov-2 infection and should not be used as the sole basis for treatment or other patient management decisions. A negative result may occur with  improper specimen collection/handling, submission of specimen other than nasopharyngeal swab, presence of viral mutation(s) within the areas targeted by this assay, and inadequate number of viral copies(<138 copies/mL). A negative result must be combined with clinical observations, patient history, and epidemiological information. The expected result is Negative.  Fact Sheet for Patients:  EntrepreneurPulse.com.au  Fact Sheet for  Healthcare Providers:  IncredibleEmployment.be  This test is no t yet approved or cleared by the [place] FDA and  has been authorized for detection and/or diagnosis of SARS-CoV-2 by FDA under an Emergency Use Authorization (EUA). This EUA will remain  in effect (meaning this test can be used) for the duration of the  COVID-19 declaration under Section 564(b)(1) of the Act, 21 U.S.C.section 360bbb-3(b)(1), unless the authorization is terminated  or revoked sooner.       Influenza A by PCR NEGATIVE NEGATIVE Final   Influenza B by PCR NEGATIVE NEGATIVE Final    Comment: (NOTE) The Xpert Xpress SARS-CoV-2/FLU/RSV plus assay is intended as an aid in the diagnosis of influenza from Nasopharyngeal swab specimens and should not be used as a sole basis for treatment. Nasal washings and aspirates are unacceptable for Xpert Xpress SARS-CoV-2/FLU/RSV testing.  Fact Sheet for Patients: EntrepreneurPulse.com.au  Fact Sheet for Healthcare Providers: IncredibleEmployment.be  This test is not yet approved or cleared by the [place] FDA and has been authorized for detection and/or diagnosis of SARS-CoV-2 by FDA under an Emergency Use Authorization (EUA). This EUA will remain in effect (meaning this test can be used) for the duration of the COVID-19 declaration under Section 564(b)(1) of the Act, 21 U.S.C. section 360bbb-3(b)(1), unless the authorization is terminated or revoked.  Performed at [facility], [place] [address] [place], [place] [address]   Urine Culture     Status: None   Collection Time: 08/07/21  5:15 PM   Specimen: Urine, Catheterized  Result Value Ref Range Status   Specimen Description URINE, CATHETERIZED  Final   Special Requests NONE  Final   Culture   Final    NO GROWTH Performed at [facility], [address] [place], [place] [address]    Report Status 08/09/2021 FINAL  Final       Radiology Studies: CT HEAD WO CONTRAST (5MM)  Result Date: 08/10/2021 CLINICAL DATA:  86 year old male with RIGHT facial droop. Refusing MRI. EXAM: CT HEAD WITHOUT CONTRAST TECHNIQUE: Contiguous axial images were obtained from the base of the skull through the vertex without intravenous contrast. RADIATION DOSE REDUCTION: This exam was performed according to the departmental dose-optimization program which includes automated exposure control, adjustment of the mA and/or kV according to patient size and/or use of iterative reconstruction technique. COMPARISON:  08/08/2021 and prior CTs FINDINGS: Brain: No evidence of acute infarction, hemorrhage, hydrocephalus, extra-axial collection or mass lesion/mass effect. Atrophy and mild chronic small-vessel white matter ischemic changes are again noted. Vascular: Carotid and vertebral atherosclerotic calcifications are noted. Skull: Normal. Negative for fracture or focal lesion. Sinuses/Orbits: No acute finding. Other: None. IMPRESSION: 1. No evidence of acute intracranial abnormality. 2. Atrophy and chronic small-vessel white matter ischemic changes. Electronically Signed   By: Margarette Canada M.D.   On: 08/10/2021 13:34    Scheduled Meds:  amLODipine  5 mg Oral Daily   amoxicillin  500 mg Oral Q8H   artificial tears   Both Eyes QHS   citalopram  10 mg Oral Daily   dutasteride  0.5 mg Oral Daily   enoxaparin (LOVENOX) injection  30 mg Subcutaneous Q24H   erythromycin  1 application Right Eye QID   feeding supplement  237 mL Oral BID BM   metoprolol tartrate  12.5 mg Oral BID   polyvinyl alcohol  2 drop Right Eye Q4H   pravastatin  20 mg Oral Daily   senna-docusate  2 tablet Oral QHS   Continuous Infusions:    LOS: 2 days   Time spent: 27 minutes  Darliss Cheney, MD [facility]  08/11/2021, 1:43 PM  Please page via [place] and do not message via secure chat for anything urgent. Secure chat can be used for anything non urgent.  How to contact  the [facility] Attending or  Consulting provider [place] or covering provider during after hours [place], for this patient?  Check the care team in [facility] and look for a) attending/consulting TRH provider listed and b) the [facility] team listed. Page or secure chat [phone]. Log into [email or web address] and use [place]'s universal password to access. If you do not have the password, please contact the hospital operator. Locate the [facility] provider you are looking for under [facility] and page to a number that you can be directly reached. If you still have difficulty reaching the provider, please page the [facility] (Director on Call) for the Hospitalists listed on amion for assistance.

## 2021-08-12 DIAGNOSIS — H269 Unspecified cataract: Secondary | ICD-10-CM

## 2021-08-12 DIAGNOSIS — G510 Bell's palsy: Secondary | ICD-10-CM

## 2021-08-12 LAB — SARS CORONAVIRUS 2 (TAT 6-24 HRS): SARS Coronavirus 2: NEGATIVE

## 2021-08-12 MED ORDER — ASPIRIN EC 81 MG PO TBEC: 81.0000 mg | DELAYED_RELEASE_TABLET | Freq: Every day | ORAL | 11 refills | Status: AC

## 2021-08-12 MED ORDER — ARTIFICIAL TEARS OPHTHALMIC OINT: TOPICAL_OINTMENT | Freq: Four times a day (QID) | OPHTHALMIC | 0 refills | Status: AC

## 2021-08-12 NOTE — TOC Progression Note (Signed)
Transition of Care [facility]) - Progression Note    Patient Details  Name: Jeffrey Zimmerman MRN: [id number] Date of Birth: May 29, 1936  [facility] of [facility]) CM/SW [place], LCSW Phone Number: 08/12/2021, 11:06 AM  Clinical Narrative:    CSW spoke with Marcie Bal with [facility] and she is completing the paperwork needed with patient's son. [facility] will pick patient up this afternoon.    Expected Discharge Plan: [place] Barriers to Discharge: Barriers Resolved  Expected Discharge Plan and Services Expected Discharge Plan: [place] In-house Referral: Clinical Social Work   Post Acute Care Choice: [place] Living arrangements for the past 2 months: [place] Expected Discharge Date: 08/12/21                                     Social Determinants of Health (SDOH) Interventions    Readmission Risk Interventions Readmission Risk Prevention Plan 08/04/2021  Transportation Screening Complete  Medication Review Press photographer) Complete  PCP or Specialist appointment within 3-5 days of discharge Not Complete  PCP/Specialist Appt Not Complete comments [place] not medically ready for discharge.  [place] or Home Care Consult Complete  SW Recovery Care/Counseling Consult Complete  Palliative Care Screening Not Applicable  Skilled Nursing Facility Not Applicable  Some recent data might be hidden

## 2021-08-12 NOTE — Discharge Summary (Signed)
[facility] Discharge Summary  Jeffrey Zimmerman:[id number] DOB: 1936/02/10 DOA: 08/07/2021  PCP: [name], MD  Admit date: 08/07/2021 Discharge date: 08/12/2021 30 Day Unplanned Readmission Risk Score    Flowsheet Row ED to Hosp-Admission (Current) from 08/07/2021 in [facility]  30 Day Unplanned Readmission Risk Score (%) 36.31 Filed at 08/12/2021 0801       This score is the patient's risk of an unplanned readmission within 30 days of being discharged (0 -100%). The score is based on dignosis, age, lab data, medications, orders, and past utilization.   Low:  0-14.9   Medium: 15-21.9   High: 22-29.9   Extreme: 30 and above          Admitted From: Home Disposition: SNF  Recommendations for Outpatient Follow-up:  Follow up with PCP in 1-2 weeks Please obtain BMP/CBC in one week Follow-up with Dr. Bevis/ophthalmology in 1 week Patient will need to see an oculoplastic specialist for possible tarsorrhaphy or gold weight to reduce exposure keratopathy. Please follow up with your PCP on the following pending results: Unresulted Labs (From admission, onward)    None         Home Health: None Equipment/Devices: None  Discharge Condition: Stable CODE STATUS: Full code Diet recommendation: Cardiac  Subjective: Seen and examined.  Fully alert and oriented.  No complaints.  Brief/Interim Summary: Jeffrey Zimmerman is a 86 y.o. male with medical history significant for advanced dementia with physical deconditioning, CAD s/p prior bare-metal stenting, HTN, or BCC s/p excision, hard of hearing who presented to the ED for evaluation of cough, nausea.   Patient recently admitted 08/02/2021-08/05/2021 for acute metabolic encephalopathy secondary to UTI and dehydration.  Urine culture grew multiple species.  Patient was previously on ampicillin for Enterococcus faecalis UTI and transitioned to amoxicillin on discharge for 7 days.  Patient has been active  with the [facility] program.  He was ultimately discharged to home with plans to seek placement to assisted living facility on an outpatient basis due to his underlying dementia.  Patient returned to the ED from home via EMS for reported chest pain, cough, nausea.  Upon arrival to ED, he was hemodynamically stable.  He was found to have AKI and dehydration. Urinalysis shows negative nitrites, trace leukocytes, 11-20 RBC/hpf, 21-50 WBC/hpf, few bacteria microscopy.  Urine culture ordered.  SARS-CoV-2 and influenza PCR are negative.  2 view chest x-ray negative for focal consolidation, edema, effusion.  Patient was given 500 cc normal saline x 2 and IV ceftriaxone.  The hospitalist service was consulted to admit for further evaluation and management of following issues.  AKI/dehydration, POA: Renal function improved after he was given IV fluids.   Hypokalemia: Resolved.    Alzheimer's dementia with behavioral disturbance ([place])- (present on admission) Chronic issue limiting ability to care for self at home and resulting in repeat admissions.  Seen by PT OT they recommend SNF.  Discussed with his son who is in agreement. Continue Celexa.   BPH (benign prostatic hyperplasia)- (present on admission) Had indwelling Foley catheter on prior admissions, Foley not present on arrival.  I was informed by the pharmacy that he was recently transition from Flomax to finasteride.  Continue PTA medications.  Coronary artery disease- (present on admission): No chest pain.  Has history of bare-metal stenting.  Per pharmacy, he is not on Plavix anymore.  Continue pravastatin.  Plavix discontinued.  I am not sure why he is not on any antiplatelet with history of  CAD with a stent.  I will discharge him on aspirin 81 mg p.o. daily.   Hypertension- (present on admission): Blood pressure controlled.  Resume home medications.   Recent UTI: Has received at least 5 to 7 days of amoxicillin.  Will discontinue at discharge.    Facial weakness: His right-sided facial droop and weakness and inability to close his right eye is going on for about 3 months.  For some reason, one of the new nurses was alarmed this as a sign of a stroke and code stroke was called, neurology was consulted.  CT head x2 negative.  Patient declined MRI.  No suspicion of stroke.  Neurology signed off.  Right-sided Bell's palsy/cataracts OU: Due to right-sided weakness of the face secondary to recent surgery he has developed Bell's palsy.  He appeared to have some infection in the right eye, for this ophthalmology on-call/Dr. Talbert Forest was called who saw him yesterday.  I am copying his complete note below.  Patient will need to see him back in the office in 1 week as well as oculoplastic specialist as outpatient.  He is being discharged on artificial tears and antibiotic ointment.  "Jeffrey Zimmerman is a 86 y.o. male with medical history significant for advanced dementia with physical deconditioning, CAD s/p prior bare-metal stenting, HTN, or BCC s/p excision, hard of hearing and Right side Bells Palsy for past 3 months s/p parotid tumor removal.  Pt has discharge from the right eye and pain.  Pt recently started on Erythromycin ointment qid and artificial tears.     On exam pt was 20/200 in both eyes though difficult to assess.  Pt states has cataracts but hasn't been back to the eye doctor in years.  Pt also says no longer wears his glasses.  Pt can see TV with both eyes but can't read captions which patient states is his baseline.  IOP soft to palpation and equal in both eyes.  Extraocular motility intact with full ductions and versions.  PERRL.     Pt with lagophthalmus and poor blink in the right eye. Pt with chemosis inferiorly and right lower lid ectropion.  Cornea clear with no epithelial defect or ulceration.  Left eye anterior segment wnl.  Pt with dense cataracts in both eyes.  The retina is flat and intact in both eyes but difficult view based on  cataracts and hospital setting.     A/P Bells Palsy right side.  Pt to use Erythromycin ointment qid and Lacrilube qid alternating so getting ointment q 2hours.  Pt needs to see an oculoplastic specialist for possible tarsorrhaphy or gold weight to reduce exposure keratopathy.  I will check with pt on Wednesday to ensure stable. Cataracts OU:  observe for now.  "  Discharge plan was discussed with patient and/or family member and they verbalized understanding and agreed with it.  Discharge Diagnoses:  Principal Problem:   AKI (acute kidney injury) ([place]) Active Problems:   Hypertension   Coronary artery disease   Mixed hyperlipidemia   Alzheimer's dementia with behavioral disturbance (H[facility])   BPH (benign prostatic hyperplasia)   Right-sided Bell's palsy   Cataract    Discharge Instructions   Allergies as of 08/12/2021       Reactions   Codeine Other (See Comments)   "Doesn't help with with pain control"        Medication List     STOP taking these medications    amoxicillin 500 MG capsule Commonly known as: AMOXIL  clopidogrel 75 MG tablet Commonly known as: PLAVIX   tamsulosin 0.4 MG Caps capsule Commonly known as: FLOMAX       TAKE these medications    acetaminophen 650 MG CR tablet Commonly known as: TYLENOL Take 650 mg by mouth every 8 (eight) hours as needed (osteoarthritis pain).   amLODipine 5 MG tablet Commonly known as: NORVASC TAKE 1 TABLET BY MOUTH DAILY   artificial tears Oint ophthalmic ointment Commonly known as: LACRILUBE Place into the right eye 4 (four) times daily for 15 days.   AZO Cranberry Gummies 250 MG Chew Generic drug: Cranberry Chew 250 mg by mouth in the morning.   AZO CRANBERRY GUMMIES PO Take 1 tablet by mouth every morning.   citalopram 10 MG tablet Commonly known as: CELEXA Take 10 mg by mouth every morning.   dutasteride 0.5 MG capsule Commonly known as: AVODART Take 0.5 mg by mouth every morning.    erythromycin ophthalmic ointment Place 1 application into the right eye 4 (four) times daily for 7 days.   finasteride 5 MG tablet Commonly known as: PROSCAR Take 5 mg by mouth every morning.   fluticasone 50 MCG/ACT nasal spray Commonly known as: FLONASE Place 1 spray into both nostrils every morning.   lactase 3000 units tablet Commonly known as: LACTAID Take 3,000-9,000 Units by mouth 3 (three) times daily with meals.   lisinopril 20 MG tablet Commonly known as: ZESTRIL Take 1 tablet (20 mg total) by mouth daily.   metoprolol tartrate 25 MG tablet Commonly known as: LOPRESSOR TAKE 1/2 TABLET BY MOUTH TWICE A DAY   nystatin ointment Commonly known as: MYCOSTATIN Apply 1 application topically 2 (two) times daily. What changed: additional instructions   polyethylene glycol 17 g packet Commonly known as: MIRALAX / GLYCOLAX Take 17 g by mouth every morning.   pravastatin 20 MG tablet Commonly known as: PRAVACHOL TAKE 1 TABLET (20 MG TOTAL) BY MOUTH DAILY. PLEASE SCHEDULE APPOINTMENT FOR FUTURE REFILLS. What changed: See the new instructions.   sennosides-docusate sodium 8.6-50 MG tablet Commonly known as: SENOKOT-S Take 2 tablets by mouth at bedtime.   silodosin 8 MG Caps capsule Commonly known as: RAPAFLO Take 8 mg by mouth every morning.   tetrahydrozoline-zinc 0.05-0.25 % ophthalmic solution Commonly known as: VISINE-AC Place 1 drop into the right eye in the morning, at noon, in the evening, and at bedtime.   Vitamin D3 50 MCG (2000 UT) Tabs Take 2,000 Units by mouth in the morning.        Contact information for follow-up providers     [name], MD Follow up in 1 week(s).   Specialty: Family Medicine Contact information: [address] [place] [address] [phone]         [name], MD .   Specialties: Cardiology, Radiology, Interventional Cardiology Contact information: [address] [place]  [place] [address] [phone]         Darleen Crocker, MD Follow up in 1 week(s).   Specialty: Ophthalmology Contact information: [place] [address] [phone]              Contact information for after-discharge care     Destination     [facility] .   Service: Skilled Nursing Contact information: [place] [phone]                    Allergies  Allergen  Reactions   Codeine Other (See Comments)    "Doesn't help with with pain control"    Consultations: Neurology and ophthalmology   Procedures/Studies: DG Chest 2 View  Result Date: 08/07/2021 CLINICAL DATA:  Chest pain EXAM: CHEST - 2 VIEW COMPARISON:  08/03/2021 FINDINGS: The heart size and mediastinal contours are within normal limits. Both lungs are clear. Disc degenerative disease of the thoracic spine. IMPRESSION: No acute abnormality of the lungs. Electronically Signed   By: Delanna Ahmadi M.D.   On: 08/07/2021 14:42   DG Chest 2 View  Result Date: 08/02/2021 CLINICAL DATA:  Shortness of breath.  Weakness. EXAM: CHEST - 2 VIEW COMPARISON:  07/08/2021 FINDINGS: The heart size and mediastinal contours are within normal limits. Coronary artery stents noted. Mild scarring again noted in left lung base. Both lungs are otherwise clear. The visualized skeletal structures are unremarkable. IMPRESSION: No active cardiopulmonary disease. Electronically Signed   By: Marlaine Hind M.D.   On: 08/02/2021 11:22   CT HEAD WO CONTRAST (5MM)  Result Date: 08/10/2021 CLINICAL DATA:  86 year old male with RIGHT facial droop. Refusing MRI. EXAM: CT HEAD WITHOUT CONTRAST TECHNIQUE: Contiguous axial images were obtained from the base of the skull through the vertex without intravenous contrast. RADIATION DOSE REDUCTION: This exam was performed according to the departmental dose-optimization program which includes automated exposure control,  adjustment of the mA and/or kV according to patient size and/or use of iterative reconstruction technique. COMPARISON:  08/08/2021 and prior CTs FINDINGS: Brain: No evidence of acute infarction, hemorrhage, hydrocephalus, extra-axial collection or mass lesion/mass effect. Atrophy and mild chronic small-vessel white matter ischemic changes are again noted. Vascular: Carotid and vertebral atherosclerotic calcifications are noted. Skull: Normal. Negative for fracture or focal lesion. Sinuses/Orbits: No acute finding. Other: None. IMPRESSION: 1. No evidence of acute intracranial abnormality. 2. Atrophy and chronic small-vessel white matter ischemic changes. Electronically Signed   By: Margarette Canada M.D.   On: 08/10/2021 13:34   DG Chest Port 1 View  Result Date: 08/03/2021 CLINICAL DATA:  86 year old male with history of poor p.o. intake. EXAM: PORTABLE CHEST 1 VIEW COMPARISON:  Chest x-ray 08/02/2021. FINDINGS: Lung volumes are low. Elevation of the right hemidiaphragm. No consolidative airspace disease. No pleural effusions. No pneumothorax. No pulmonary nodule or mass noted. Pulmonary vasculature and the cardiomediastinal silhouette are within normal limits. Atherosclerosis in the thoracic aorta. IMPRESSION: 1. Low lung volumes without radiographic evidence of acute cardiopulmonary disease. 2. Elevation of the right hemidiaphragm. Electronically Signed   By: Vinnie Langton M.D.   On: 08/03/2021 07:20   CT HEAD CODE STROKE WO CONTRAST  Result Date: 08/08/2021 CLINICAL DATA:  Code stroke. Initial evaluation for acute neuro deficit, stroke suspected. EXAM: CT HEAD WITHOUT CONTRAST TECHNIQUE: Contiguous axial images were obtained from the base of the skull through the vertex without intravenous contrast. RADIATION DOSE REDUCTION: This exam was performed according to the departmental dose-optimization program which includes automated exposure control, adjustment of the mA and/or kV according to patient size and/or  use of iterative reconstruction technique. COMPARISON:  Prior study from 11/20/2019. FINDINGS: Brain: Age-related cerebral atrophy with mild chronic small vessel ischemic disease. No acute intracranial hemorrhage or large vessel territory infarct. No mass lesion or midline shift. Mild ventricular prominence related global parenchymal volume loss of hydrocephalus. No extra-axial fluid collection. Vascular: No hyperdense vessel. Calcified atherosclerosis present at the skull base. Skull: Scalp soft tissues and calvarium within normal limits. Sinuses/Orbits: Globes and orbital soft tissues demonstrate no acute finding. Paranasal  sinuses and mastoid air cells are clear. Other: None. ASPECTS [facility] Stroke Program Early CT Score) - Ganglionic level infarction (caudate, lentiform nuclei, internal capsule, insula, M1-M3 cortex): 7 - Supraganglionic infarction (M4-M6 cortex): 3 Total score (0-10 with 10 being normal): 10 IMPRESSION: 1. No acute intracranial abnormality. 2. ASPECTS is 10. 3. Age-related cerebral atrophy with mild chronic small vessel ischemic disease. These results were communicated to Dr. Leonel Ramsay at 9:41 pm on 08/08/2021 by text page via the [facility] messaging system. Electronically Signed   By: Jeannine Boga M.D.   On: 08/08/2021 21:42     Discharge Exam: Vitals:   08/12/21 0400 08/12/21 0818  BP: 131/77 137/77  Pulse: 74 78  Resp: (!) 22 (!) 22  Temp: 98 F (36.7 C) 98.3 F (36.8 C)  SpO2: 95% 93%   Vitals:   08/11/21 1623 08/11/21 1918 08/12/21 0400 08/12/21 0818  BP: 127/66 (!) 141/74 131/77 137/77  Pulse: 74 68 74 78  Resp: (!) 24 19 (!) 22 (!) 22  Temp: 98.2 F (36.8 C) 98.3 F (36.8 C) 98 F (36.7 C) 98.3 F (36.8 C)  TempSrc: Oral Oral Oral Oral  SpO2: 94% 95% 95% 93%    General: Pt is alert, awake, not in acute distress Cardiovascular: RRR, S1/S2 +, no rubs, no gallops Respiratory: CTA bilaterally, no wheezing, no rhonchi Abdominal: Soft, NT, ND, bowel sounds  + Extremities: no edema, no cyanosis    The results of significant diagnostics from this hospitalization (including imaging, microbiology, ancillary and laboratory) are listed below for reference.     Microbiology: Recent Results (from the past 240 hour(s))  Urine Culture     Status: Abnormal   Collection Time: 08/02/21 10:34 AM   Specimen: Urine, Catheterized  Result Value Ref Range Status   Specimen Description URINE, CATHETERIZED  Final   Special Requests   Final    NONE Performed at [facility], [address] [place], [place] [address]    Culture MULTIPLE SPECIES PRESENT, SUGGEST RECOLLECTION (A)  Final   Report Status 08/03/2021 FINAL  Final  Resp Panel by RT-PCR (Flu A&B, Covid) Nasopharyngeal Swab     Status: None   Collection Time: 08/02/21 10:34 AM   Specimen: Nasopharyngeal Swab; Nasopharyngeal(NP) swabs in vial transport medium  Result Value Ref Range Status   SARS Coronavirus 2 by RT PCR NEGATIVE NEGATIVE Final    Comment: (NOTE) SARS-CoV-2 target nucleic acids are NOT DETECTED.  The SARS-CoV-2 RNA is generally detectable in upper respiratory specimens during the acute phase of infection. The lowest concentration of SARS-CoV-2 viral copies this assay can detect is 138 copies/mL. A negative result does not preclude SARS-Cov-2 infection and should not be used as the sole basis for treatment or other patient management decisions. A negative result may occur with  improper specimen collection/handling, submission of specimen other than nasopharyngeal swab, presence of viral mutation(s) within the areas targeted by this assay, and inadequate number of viral copies(<138 copies/mL). A negative result must be combined with clinical observations, patient history, and epidemiological information. The expected result is Negative.  Fact Sheet for Patients:  EntrepreneurPulse.com.au  Fact Sheet for Healthcare Providers:   IncredibleEmployment.be  This test is no t yet approved or cleared by the [place] FDA and  has been authorized for detection and/or diagnosis of SARS-CoV-2 by FDA under an Emergency Use Authorization (EUA). This EUA will remain  in effect (meaning this test can be used) for the duration of the COVID-19 declaration under  Section 564(b)(1) of the Act, 21 U.S.C.section 360bbb-3(b)(1), unless the authorization is terminated  or revoked sooner.       Influenza A by PCR NEGATIVE NEGATIVE Final   Influenza B by PCR NEGATIVE NEGATIVE Final    Comment: (NOTE) The Xpert Xpress SARS-CoV-2/FLU/RSV plus assay is intended as an aid in the diagnosis of influenza from Nasopharyngeal swab specimens and should not be used as a sole basis for treatment. Nasal washings and aspirates are unacceptable for Xpert Xpress SARS-CoV-2/FLU/RSV testing.  Fact Sheet for Patients: EntrepreneurPulse.com.au  Fact Sheet for Healthcare Providers: IncredibleEmployment.be  This test is not yet approved or cleared by the [place] FDA and has been authorized for detection and/or diagnosis of SARS-CoV-2 by FDA under an Emergency Use Authorization (EUA). This EUA will remain in effect (meaning this test can be used) for the duration of the COVID-19 declaration under Section 564(b)(1) of the Act, 21 U.S.C. section 360bbb-3(b)(1), unless the authorization is terminated or revoked.  Performed at [facility], [place] [address], [place], [place] [address]   Culture, blood (routine x 2)     Status: None   Collection Time: 08/02/21 11:35 AM   Specimen: BLOOD  Result Value Ref Range Status   Specimen Description BLOOD LEFT ANTECUBITAL  Final   Special Requests   Final    BOTTLES DRAWN AEROBIC AND ANAEROBIC Blood Culture adequate volume   Culture   Final    NO GROWTH 5 DAYS Performed at [facility], [place] [address] [place], [place] [address]     Report Status 08/07/2021 FINAL  Final  Culture, blood (routine x 2)     Status: None   Collection Time: 08/02/21 11:40 AM   Specimen: BLOOD RIGHT FOREARM  Result Value Ref Range Status   Specimen Description BLOOD RIGHT FOREARM  Final   Special Requests   Final    BOTTLES DRAWN AEROBIC AND ANAEROBIC Blood Culture adequate volume   Culture   Final    NO GROWTH 5 DAYS Performed at [facility], [place] [address] [place], [place] [address]    Report Status 08/07/2021 FINAL  Final  Resp Panel by RT-PCR (Flu A&B, Covid) Nasopharyngeal Swab     Status: None   Collection Time: 08/07/21  3:12 PM   Specimen: Nasopharyngeal Swab; Nasopharyngeal(NP) swabs in vial transport medium  Result Value Ref Range Status   SARS Coronavirus 2 by RT PCR NEGATIVE NEGATIVE Final    Comment: (NOTE) SARS-CoV-2 target nucleic acids are NOT DETECTED.  The SARS-CoV-2 RNA is generally detectable in upper respiratory specimens during the acute phase of infection. The lowest concentration of SARS-CoV-2 viral copies this assay can detect is 138 copies/mL. A negative result does not preclude SARS-Cov-2 infection and should not be used as the sole basis for treatment or other patient management decisions. A negative result may occur with  improper specimen collection/handling, submission of specimen other than nasopharyngeal swab, presence of viral mutation(s) within the areas targeted by this assay, and inadequate number of viral copies(<138 copies/mL). A negative result must be combined with clinical observations, patient history, and epidemiological information. The expected result is Negative.  Fact Sheet for Patients:  EntrepreneurPulse.com.au  Fact Sheet for Healthcare Providers:  IncredibleEmployment.be  This test is no t yet approved or cleared by the [place] FDA and  has been authorized for detection and/or diagnosis of SARS-CoV-2 by FDA under an  Emergency Use Authorization (EUA). This EUA will remain  in effect (meaning  this test can be used) for the duration of the COVID-19 declaration under Section 564(b)(1) of the Act, 21 U.S.C.section 360bbb-3(b)(1), unless the authorization is terminated  or revoked sooner.       Influenza A by PCR NEGATIVE NEGATIVE Final   Influenza B by PCR NEGATIVE NEGATIVE Final    Comment: (NOTE) The Xpert Xpress SARS-CoV-2/FLU/RSV plus assay is intended as an aid in the diagnosis of influenza from Nasopharyngeal swab specimens and should not be used as a sole basis for treatment. Nasal washings and aspirates are unacceptable for Xpert Xpress SARS-CoV-2/FLU/RSV testing.  Fact Sheet for Patients: EntrepreneurPulse.com.au  Fact Sheet for Healthcare Providers: IncredibleEmployment.be  This test is not yet approved or cleared by the [place] FDA and has been authorized for detection and/or diagnosis of SARS-CoV-2 by FDA under an Emergency Use Authorization (EUA). This EUA will remain in effect (meaning this test can be used) for the duration of the COVID-19 declaration under Section 564(b)(1) of the Act, 21 U.S.C. section 360bbb-3(b)(1), unless the authorization is terminated or revoked.  Performed at [facility], [place] [address] [place], [place] [address]   Urine Culture     Status: None   Collection Time: 08/07/21  5:15 PM   Specimen: Urine, Catheterized  Result Value Ref Range Status   Specimen Description URINE, CATHETERIZED  Final   Special Requests NONE  Final   Culture   Final    NO GROWTH Performed at [facility], [address], [place], [place] [address]    Report Status 08/09/2021 FINAL  Final  SARS CORONAVIRUS 2 (TAT 6-24 HRS) Nasopharyngeal Nasopharyngeal Swab     Status: None   Collection Time: 08/11/21  4:19 PM   Specimen: Nasopharyngeal Swab  Result Value Ref Range Status   SARS Coronavirus 2 NEGATIVE NEGATIVE Final     Comment: (NOTE) SARS-CoV-2 target nucleic acids are NOT DETECTED.  The SARS-CoV-2 RNA is generally detectable in upper and lower respiratory specimens during the acute phase of infection. Negative results do not preclude SARS-CoV-2 infection, do not rule out co-infections with other pathogens, and should not be used as the sole basis for treatment or other patient management decisions. Negative results must be combined with clinical observations, patient history, and epidemiological information. The expected result is Negative.  Fact Sheet for Patients: SugarRoll.be  Fact Sheet for Healthcare Providers: https://www.woods-mathews.com/  This test is not yet approved or cleared by the [place] FDA and  has been authorized for detection and/or diagnosis of SARS-CoV-2 by FDA under an Emergency Use Authorization (EUA). This EUA will remain  in effect (meaning this test can be used) for the duration of the COVID-19 declaration under Se ction 564(b)(1) of the Act, 21 U.S.C. section 360bbb-3(b)(1), unless the authorization is terminated or revoked sooner.  Performed at [facility], [place] [address] [place], [place] [address]      Labs: BNP (last 3 results) No results for input(s): BNP in the last 8760 hours. Basic Metabolic Panel: Recent Labs  Lab 08/07/21 1409 08/08/21 0603 08/09/21 0923 08/10/21 0833 08/11/21 0025  NA 143 143 139 138 137  K 3.7 3.1* 3.5 3.3* 3.5  CL 106 107 103 100 102  CO2 25 24 27 25 23   GLUCOSE 152* 82 96 77 69*  BUN 27* 24* 12 9 12   CREATININE 1.81* 0.96 0.70 0.81 0.77  CALCIUM 9.3 8.1* 8.3* 8.0* 8.3*  MG 2.0  --  1.7  --   --  Liver Function Tests: No results for input(s): AST, ALT, ALKPHOS, BILITOT, PROT, ALBUMIN in the last 168 hours. No results for input(s): LIPASE, AMYLASE in the last 168 hours. No results for input(s): AMMONIA in the last 168 hours. CBC: Recent Labs  Lab  08/07/21 1409 08/08/21 0603 08/09/21 0923  WBC 12.0* 6.6 4.5  NEUTROABS 10.0*  --  3.2  HGB 14.2 11.2* 11.8*  HCT 42.9 35.3* 36.3*  MCV 90.5 94.4 89.9  PLT 331 188 203   Cardiac Enzymes: No results for input(s): CKTOTAL, CKMB, CKMBINDEX, TROPONINI in the last 168 hours. BNP: Invalid input(s): POCBNP CBG: Recent Labs  Lab 08/08/21 2107  GLUCAP 99   D-Dimer No results for input(s): DDIMER in the last 72 hours. Hgb A1c No results for input(s): HGBA1C in the last 72 hours. Lipid Profile No results for input(s): CHOL, HDL, LDLCALC, TRIG, CHOLHDL, LDLDIRECT in the last 72 hours. Thyroid function studies No results for input(s): TSH, T4TOTAL, T3FREE, THYROIDAB in the last 72 hours.  Invalid input(s): FREET3 Anemia work up No results for input(s): VITAMINB12, FOLATE, FERRITIN, TIBC, IRON, RETICCTPCT in the last 72 hours. Urinalysis    Component Value Date/Time   COLORURINE YELLOW 08/07/2021 1715   APPEARANCEUR CLEAR 08/07/2021 1715   LABSPEC >1.030 (H) 08/07/2021 1715   PHURINE 5.5 08/07/2021 [place] 08/07/2021 1715   HGBUR MODERATE (A) 08/07/2021 1715   BILIRUBINUR MODERATE (A) 08/07/2021 1715   KETONESUR 15 (A) 08/07/2021 1715   PROTEINUR 100 (A) 08/07/2021 1715   NITRITE NEGATIVE 08/07/2021 1715   LEUKOCYTESUR TRACE (A) 08/07/2021 1715   Sepsis Labs Invalid input(s): PROCALCITONIN,  WBC,  LACTICIDVEN Microbiology Recent Results (from the past 240 hour(s))  Urine Culture     Status: Abnormal   Collection Time: 08/02/21 10:34 AM   Specimen: Urine, Catheterized  Result Value Ref Range Status   Specimen Description URINE, CATHETERIZED  Final   Special Requests   Final    NONE Performed at [facility], [address] [place], [place] [address]    Culture MULTIPLE SPECIES PRESENT, SUGGEST RECOLLECTION (A)  Final   Report Status 08/03/2021 FINAL  Final  Resp Panel by RT-PCR (Flu A&B, Covid) Nasopharyngeal Swab     Status: None   Collection  Time: 08/02/21 10:34 AM   Specimen: Nasopharyngeal Swab; Nasopharyngeal(NP) swabs in vial transport medium  Result Value Ref Range Status   SARS Coronavirus 2 by RT PCR NEGATIVE NEGATIVE Final    Comment: (NOTE) SARS-CoV-2 target nucleic acids are NOT DETECTED.  The SARS-CoV-2 RNA is generally detectable in upper respiratory specimens during the acute phase of infection. The lowest concentration of SARS-CoV-2 viral copies this assay can detect is 138 copies/mL. A negative result does not preclude SARS-Cov-2 infection and should not be used as the sole basis for treatment or other patient management decisions. A negative result may occur with  improper specimen collection/handling, submission of specimen other than nasopharyngeal swab, presence of viral mutation(s) within the areas targeted by this assay, and inadequate number of viral copies(<138 copies/mL). A negative result must be combined with clinical observations, patient history, and epidemiological information. The expected result is Negative.  Fact Sheet for Patients:  EntrepreneurPulse.com.au  Fact Sheet for Healthcare Providers:  IncredibleEmployment.be  This test is no t yet approved or cleared by the [place] FDA and  has been authorized for detection and/or diagnosis of SARS-CoV-2 by FDA under an Emergency Use Authorization (EUA). This EUA will remain  in effect (  meaning this test can be used) for the duration of the COVID-19 declaration under Section 564(b)(1) of the Act, 21 U.S.C.section 360bbb-3(b)(1), unless the authorization is terminated  or revoked sooner.       Influenza A by PCR NEGATIVE NEGATIVE Final   Influenza B by PCR NEGATIVE NEGATIVE Final    Comment: (NOTE) The Xpert Xpress SARS-CoV-2/FLU/RSV plus assay is intended as an aid in the diagnosis of influenza from Nasopharyngeal swab specimens and should not be used as a sole basis for treatment. Nasal washings  and aspirates are unacceptable for Xpert Xpress SARS-CoV-2/FLU/RSV testing.  Fact Sheet for Patients: EntrepreneurPulse.com.au  Fact Sheet for Healthcare Providers: IncredibleEmployment.be  This test is not yet approved or cleared by the [place] FDA and has been authorized for detection and/or diagnosis of SARS-CoV-2 by FDA under an Emergency Use Authorization (EUA). This EUA will remain in effect (meaning this test can be used) for the duration of the COVID-19 declaration under Section 564(b)(1) of the Act, 21 U.S.C. section 360bbb-3(b)(1), unless the authorization is terminated or revoked.  Performed at [facility], [place] [address], [place], [place] [address]   Culture, blood (routine x 2)     Status: None   Collection Time: 08/02/21 11:35 AM   Specimen: BLOOD  Result Value Ref Range Status   Specimen Description BLOOD LEFT ANTECUBITAL  Final   Special Requests   Final    BOTTLES DRAWN AEROBIC AND ANAEROBIC Blood Culture adequate volume   Culture   Final    NO GROWTH 5 DAYS Performed at [facility], [place] [address], [place], [place] [address]    Report Status 08/07/2021 FINAL  Final  Culture, blood (routine x 2)     Status: None   Collection Time: 08/02/21 11:40 AM   Specimen: BLOOD RIGHT FOREARM  Result Value Ref Range Status   Specimen Description BLOOD RIGHT FOREARM  Final   Special Requests   Final    BOTTLES DRAWN AEROBIC AND ANAEROBIC Blood Culture adequate volume   Culture   Final    NO GROWTH 5 DAYS Performed at [facility], [place] [address], [place], [place] [address]    Report Status 08/07/2021 FINAL  Final  Resp Panel by RT-PCR (Flu A&B, Covid) Nasopharyngeal Swab     Status: None   Collection Time: 08/07/21  3:12 PM   Specimen: Nasopharyngeal Swab; Nasopharyngeal(NP) swabs in vial transport medium  Result Value Ref Range Status   SARS Coronavirus 2 by RT PCR NEGATIVE NEGATIVE Final    Comment:  (NOTE) SARS-CoV-2 target nucleic acids are NOT DETECTED.  The SARS-CoV-2 RNA is generally detectable in upper respiratory specimens during the acute phase of infection. The lowest concentration of SARS-CoV-2 viral copies this assay can detect is 138 copies/mL. A negative result does not preclude SARS-Cov-2 infection and should not be used as the sole basis for treatment or other patient management decisions. A negative result may occur with  improper specimen collection/handling, submission of specimen other than nasopharyngeal swab, presence of viral mutation(s) within the areas targeted by this assay, and inadequate number of viral copies(<138 copies/mL). A negative result must be combined with clinical observations, patient history, and epidemiological information. The expected result is Negative.  Fact Sheet for Patients:  EntrepreneurPulse.com.au  Fact Sheet for Healthcare Providers:  IncredibleEmployment.be  This test is no t yet approved or cleared by the [place] FDA and  has been authorized for detection and/or diagnosis of SARS-CoV-2 by FDA  under an Emergency Use Authorization (EUA). This EUA will remain  in effect (meaning this test can be used) for the duration of the COVID-19 declaration under Section 564(b)(1) of the Act, 21 U.S.C.section 360bbb-3(b)(1), unless the authorization is terminated  or revoked sooner.       Influenza A by PCR NEGATIVE NEGATIVE Final   Influenza B by PCR NEGATIVE NEGATIVE Final    Comment: (NOTE) The Xpert Xpress SARS-CoV-2/FLU/RSV plus assay is intended as an aid in the diagnosis of influenza from Nasopharyngeal swab specimens and should not be used as a sole basis for treatment. Nasal washings and aspirates are unacceptable for Xpert Xpress SARS-CoV-2/FLU/RSV testing.  Fact Sheet for Patients: EntrepreneurPulse.com.au  Fact Sheet for Healthcare  Providers: IncredibleEmployment.be  This test is not yet approved or cleared by the [place] FDA and has been authorized for detection and/or diagnosis of SARS-CoV-2 by FDA under an Emergency Use Authorization (EUA). This EUA will remain in effect (meaning this test can be used) for the duration of the COVID-19 declaration under Section 564(b)(1) of the Act, 21 U.S.C. section 360bbb-3(b)(1), unless the authorization is terminated or revoked.  Performed at [facility], [place] [address], [place], [place] [address]   Urine Culture     Status: None   Collection Time: 08/07/21  5:15 PM   Specimen: Urine, Catheterized  Result Value Ref Range Status   Specimen Description URINE, CATHETERIZED  Final   Special Requests NONE  Final   Culture   Final    NO GROWTH Performed at [facility], [address], [place], [place] [address]    Report Status 08/09/2021 FINAL  Final  SARS CORONAVIRUS 2 (TAT 6-24 HRS) Nasopharyngeal Nasopharyngeal Swab     Status: None   Collection Time: 08/11/21  4:19 PM   Specimen: Nasopharyngeal Swab  Result Value Ref Range Status   SARS Coronavirus 2 NEGATIVE NEGATIVE Final    Comment: (NOTE) SARS-CoV-2 target nucleic acids are NOT DETECTED.  The SARS-CoV-2 RNA is generally detectable in upper and lower respiratory specimens during the acute phase of infection. Negative results do not preclude SARS-CoV-2 infection, do not rule out co-infections with other pathogens, and should not be used as the sole basis for treatment or other patient management decisions. Negative results must be combined with clinical observations, patient history, and epidemiological information. The expected result is Negative.  Fact Sheet for Patients: SugarRoll.be  Fact Sheet for Healthcare Providers: https://www.woods-mathews.com/  This test is not yet approved or cleared by the [place] FDA and  has  been authorized for detection and/or diagnosis of SARS-CoV-2 by FDA under an Emergency Use Authorization (EUA). This EUA will remain  in effect (meaning this test can be used) for the duration of the COVID-19 declaration under Se ction 564(b)(1) of the Act, 21 U.S.C. section 360bbb-3(b)(1), unless the authorization is terminated or revoked sooner.  Performed at [facility], [place] [address], [place], [place] [address]      Time coordinating discharge: Over 30 minutes  SIGNED:   Darliss Cheney, MD  [facility] 08/12/2021, 9:58 AM  If 7PM-7AM, please contact night-coverage [email or web address]

## 2021-08-12 NOTE — Progress Notes (Signed)
Physical Therapy Treatment Patient Details Name: Jeffrey Zimmerman MRN: [id number] DOB: 1935-09-17 Today's Date: 08/12/2021   History of Present Illness Pt is a 86 y/o male presenting on 08/07/21 with cough, nausea. Recent admission 1/14-1/17 with AMS due to UTI/dehydration. Found with AKI. PMH includes: advanced dementia with behavioral disturbance, CAD, HTN, hard of hearing, gout, IBS, MI, colon cancer.    PT Comments    Pt pleasant and able to state need for assist on arrival. Pt with linens soaked with urine and stool on arrival with pt having not rung out of RN nor did he notify SLP who was in room directly before arrival. Pt with max assist for pericare and linen change with improved ability with transfers and limited gait this session. Pt educated for safety with transfers and progression with mobility and HEP. Will continue to follow.     Recommendations for follow up therapy are one component of a multi-disciplinary discharge planning process, led by the attending physician.  Recommendations may be updated based on patient status, additional functional criteria and insurance authorization.  Follow Up Recommendations  Skilled nursing-short term rehab (<3 hours/day)     Assistance Recommended at Discharge Frequent or constant Supervision/Assistance  Patient can return home with the following Direct supervision/assist for medications management;Assistance with cooking/housework;Direct supervision/assist for financial management;Assist for transportation;Help with stairs or ramp for entrance;A little help with walking and/or transfers;A lot of help with bathing/dressing/bathroom   Equipment Recommendations  None recommended by PT    Recommendations for Other Services       Precautions / Restrictions Precautions Precautions: Fall Precaution Comments: incontinence Restrictions Weight Bearing Restrictions: No     Mobility  Bed Mobility Overal bed mobility: Needs Assistance Bed  Mobility: Supine to Sit     Supine to sit: Min guard, HOB elevated     General bed mobility comments: HOB 40 degrees with use of rail and increased time pt able to pivot to EOB without physical assist.    Transfers Overall transfer level: Needs assistance   Transfers: Sit to/from Stand, Bed to chair/wheelchair/BSC Sit to Stand: Min assist Stand pivot transfers: Min assist         General transfer comment: min assist with cues for hand placement to stand from bed, pivot with RW to [facility] and sit at [facility]. Max assist for pericare at [facility]    Ambulation/Gait Ambulation/Gait assistance: Min guard Gait Distance (Feet): 20 Feet Assistive device: Rolling walker (2 wheels) Gait Pattern/deviations: Step-through pattern, Decreased stride length, Trunk flexed   Gait velocity interpretation: 1.31 - 2.62 ft/sec, indicative of limited community ambulator   General Gait Details: cues for posture and gait, limited by incontinent urine   Stairs             Wheelchair Mobility    Modified Rankin (Stroke Patients Only)       Balance Overall balance assessment: Needs assistance Sitting-balance support: No upper extremity supported, Feet supported Sitting balance-Leahy Scale: Fair Sitting balance - Comments: EOB without assist   Standing balance support: Bilateral upper extremity supported, During functional activity Standing balance-Leahy Scale: Poor Standing balance comment: reliant on RW                            Cognition Arousal/Alertness: Awake/alert Behavior During Therapy: WFL for tasks assessed/performed Overall Cognitive Status: No family/caregiver present to determine baseline cognitive functioning  General Comments: pt not oriented to day and incontinent of bowel and bladder on arrival but had not requested assistance        Exercises General Exercises - Lower Extremity Long Arc Quad: AROM, Both, Seated,  15 reps Hip Flexion/Marching: AROM, Both, Seated, 15 reps    General Comments        Pertinent Vitals/Pain Pain Assessment Pain Assessment: No/denies pain    Home Living                          Prior Function            PT Goals (current goals can now be found in the care plan section) Progress towards PT goals: Progressing toward goals    Frequency    Min 2X/week      PT Plan Current plan remains appropriate    Co-evaluation              AM-PAC PT "6 Clicks" Mobility   Outcome Measure  Help needed turning from your back to your side while in a flat bed without using bedrails?: A Little Help needed moving from lying on your back to sitting on the side of a flat bed without using bedrails?: A Little Help needed moving to and from a bed to a chair (including a wheelchair)?: A Little Help needed standing up from a chair using your arms (e.g., wheelchair or bedside chair)?: A Little Help needed to walk in hospital room?: A Lot Help needed climbing 3-5 steps with a railing? : Total 6 Click Score: 15    End of Session Equipment Utilized During Treatment: Gait belt Activity Tolerance: Patient tolerated treatment well Patient left: in chair;with call bell/phone within reach;with chair alarm set Nurse Communication: Mobility status PT Visit Diagnosis: Other abnormalities of gait and mobility (R26.89);Muscle weakness (generalized) (M62.81);Difficulty in walking, not elsewhere classified (R26.2)     Time: 8546-2703 PT Time Calculation (min) (ACUTE ONLY): 24 min  Charges:  $Therapeutic Exercise: 8-22 mins $Therapeutic Activity: 8-22 mins                     Lowen Mansouri P, PT Acute Rehabilitation Services Pager: [phone] Office: [phone]    Keyondra Lagrand B Merick Kelleher 08/12/2021, 11:14 AM

## 2021-08-12 NOTE — TOC Progression Note (Addendum)
Transition of Care [facility]) - Progression Note    Patient Details  Name: Jeffrey Zimmerman MRN: [id number] Date of Birth: 10/09/1935  [facility]) CM/SW [place], LCSW Phone Number: 08/12/2021, 1:06 PM  Clinical Narrative:    1pm-CSW received call from Jeffrey Zimmerman with [facility] stating [facility] just told them they cannot accept any more admissions today due to the [facility] coming in. CSW cancelled transport with [facility].   [facility] contacting facilities to see if they would agree to a one time contract. CSW requested [facility] review.   3pm-Per [facility]ling able to accept patient tomorrow under one time contract.   Expected Discharge Plan: [place] Barriers to Discharge: Barriers Resolved  Expected Discharge Plan and Services Expected Discharge Plan: [place] In-house Referral: Clinical Social Work   Post Acute Care Choice: [place] Living arrangements for the past 2 months: [place] Expected Discharge Date: 08/12/21                                     Social Determinants of Health (SDOH) Interventions    Readmission Risk Interventions Readmission Risk Prevention Plan 08/04/2021  Transportation Screening Complete  Medication Review Press photographer) Complete  PCP or Specialist appointment within 3-5 days of discharge Not Complete  PCP/Specialist Appt Not Complete comments [place] not medically ready for discharge.  [place] or Home Care Consult Complete  SW Recovery Care/Counseling Consult Complete  Palliative Care Screening Not Applicable  Skilled Nursing Facility Not Applicable  Some recent data might be hidden

## 2021-08-12 NOTE — Progress Notes (Signed)
Speech Language Pathology Treatment: Dysphagia  Patient Details Name: ESDRAS DELAIR MRN: [id number] DOB: [birth year] Today's Date: 08/12/2021 Time: 2536-6440 SLP Time Calculation (min) (ACUTE ONLY): 15 min  Assessment / Plan / Recommendation Clinical Impression  Patient seen by SLP for skilled treatment focusing on dysphagia goals. Patient awake and alert, requested orange juice. SLP observed patient with straw sips of thin liquids (juice) and did not observe any overt s/s aspiration or penetration. Patient's voice remained clear throughout. SLP contacted patient's RN via secure EPIC chat and she reported that she has not observed patient to have any difficulty with swallowing but that he has not eaten much at all. Plan is for patient to discharge to SNF today. SLP plans to s/o on patient at this time as he appears to be tolerating regular texture solids and thin liquids. No recommendation for SNF f/u ST services at this time.     HPI HPI: DAVONNE JARNIGAN is a 86 y.o. 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 MRI is pending, if no CVA pt to have f/u with surgeons for steriod treatment.      SLP Plan  Discharge SLP treatment due to (comment) (goals met)      Recommendations for follow up therapy are one component of a multi-disciplinary discharge planning process, led by the attending physician.  Recommendations may be updated based on patient status, additional functional criteria and insurance authorization.    Recommendations  Diet recommendations: Regular;Thin liquid Liquids provided via: Straw;Cup Medication Administration: Whole meds with liquid Compensations: Minimize environmental distractions;Slow rate;Small sips/bites                Follow Up Recommendations: No SLP follow up SLP Visit Diagnosis: Dysphagia, unspecified (R13.10) Plan: Discharge SLP  treatment due to (comment) (goals met)        Sonia Baller, MA, CCC-SLP Speech Therapy

## 2021-08-13 LAB — BASIC METABOLIC PANEL
Anion gap: 12 (ref 5–15)
BUN: 19 mg/dL (ref 8–23)
CO2: 25 mmol/L (ref 22–32)
Calcium: 9.3 mg/dL (ref 8.9–10.3)
Chloride: 98 mmol/L (ref 98–111)
Creatinine, Ser: 0.94 mg/dL (ref 0.61–1.24)
GFR, Estimated: >60 mL/min (ref >60)
Glucose, Bld: 121 mg/dL — ABNORMAL HIGH (ref 70–99)
Potassium: 3.6 mmol/L (ref 3.5–5.1)
Sodium: 135 mmol/L (ref 135–145)

## 2021-08-13 LAB — MAGNESIUM: Magnesium: 2 mg/dL (ref 1.7–2.4)

## 2021-08-13 NOTE — TOC Progression Note (Signed)
Transition of Care [facility]) - Progression Note    Patient Details  Name: Jeffrey Zimmerman MRN: [id number] Date of Birth: [birth year]  Transition of Care [facility]) CM/SW [place], LCSW Phone Number: 08/13/2021, 11:24 AM  Clinical Narrative:    CSW spoke with Tressa Busman at [facility]. He stated that the [facility] contract is signed and they are ready for patient. CSW notified Marcie Bal with [facility] and she will coordinate transportation.    Expected Discharge Plan: [place] Barriers to Discharge: Barriers Resolved  Expected Discharge Plan and Services Expected Discharge Plan: [place] In-house Referral: Clinical Social Work   Post Acute Care Choice: [place] Living arrangements for the past 2 months: [place] Expected Discharge Date: 08/12/21                                     Social Determinants of Health (SDOH) Interventions    Readmission Risk Interventions Readmission Risk Prevention Plan 08/04/2021  Transportation Screening Complete  Medication Review Press photographer) Complete  PCP or Specialist appointment within 3-5 days of discharge Not Complete  PCP/Specialist Appt Not Complete comments [place] not medically ready for discharge.  [place] or Home Care Consult Complete  SW Recovery Care/Counseling Consult Complete  Palliative Care Screening Not Applicable  Skilled Nursing Facility Not Applicable  Some recent data might be hidden

## 2021-08-13 NOTE — Discharge Summary (Signed)
[facility] Discharge Summary  Jeffrey Zimmerman WLN:[id number] DOB: April 29, 1936 DOA: 08/07/2021  PCP: [name], MD  Admit date: 08/07/2021 Discharge date: 08/13/2021 30 Day Unplanned Readmission Risk Score    Flowsheet Row ED to Hosp-Admission (Current) from 08/07/2021 in [facility]  30 Day Unplanned Readmission Risk Score (%) 36.31 Filed at 08/12/2021 0801       This score is the patient's risk of an unplanned readmission within 30 days of being discharged (0 -100%). The score is based on dignosis, age, lab data, medications, orders, and past utilization.   Low:  0-14.9   Medium: 15-21.9   High: 22-29.9   Extreme: 30 and above          Admitted From: Home Disposition: SNF  Recommendations for Outpatient Follow-up:  Follow up with PCP in 1-2 weeks Please obtain BMP/CBC in one week Follow-up with Dr. Bevis/ophthalmology in 1 week Patient will need to see an oculoplastic specialist for possible tarsorrhaphy or gold weight to reduce exposure keratopathy. Please follow up with your PCP on the following pending results: Unresulted Labs (From admission, onward)    None         Home Health: None Equipment/Devices: None  Discharge Condition: Stable CODE STATUS: Full code Diet recommendation: Cardiac  Subjective: Seen and examined.  Fully alert and oriented.  No complaints.  Brief/Interim Summary: Jeffrey Zimmerman is a 86 y.o. male with medical history significant for advanced dementia with physical deconditioning, CAD s/p prior bare-metal stenting, HTN, or BCC s/p excision, hard of hearing who presented to the ED for evaluation of cough, nausea.   Patient recently admitted 08/02/2021-08/05/2021 for acute metabolic encephalopathy secondary to UTI and dehydration.  Urine culture grew multiple species.  Patient was previously on ampicillin for Enterococcus faecalis UTI and transitioned to amoxicillin on discharge for 7 days.  Patient has been active  with the pace program.  He was ultimately discharged to home with plans to seek placement to assisted living facility on an outpatient basis due to his underlying dementia.  Patient returned to the ED from home via EMS for reported chest pain, cough, nausea.  Upon arrival to ED, he was hemodynamically stable.  He was found to have AKI and dehydration. Urinalysis shows negative nitrites, trace leukocytes, 11-20 RBC/hpf, 21-50 WBC/hpf, few bacteria microscopy.  Urine culture ordered.  SARS-CoV-2 and influenza PCR are negative.  2 view chest x-ray negative for focal consolidation, edema, effusion.  Patient was given 500 cc normal saline x 2 and IV ceftriaxone.  The hospitalist service was consulted to admit for further evaluation and management of following issues.  AKI/dehydration, POA: Renal function improved after he was given IV fluids.   Hypokalemia: Resolved.    Alzheimer's dementia with behavioral disturbance ([place])- (present on admission) Chronic issue limiting ability to care for self at home and resulting in repeat admissions.  Seen by PT OT they recommend SNF.  Discussed with his son who is in agreement. Continue Celexa.   BPH (benign prostatic hyperplasia)- (present on admission) Had indwelling Foley catheter on prior admissions, Foley not present on arrival.  I was informed by the pharmacy that he was recently transition from Flomax to finasteride.  Continue PTA medications.  Coronary artery disease- (present on admission): No chest pain.  Has history of bare-metal stenting.  Per pharmacy, he is not on Plavix anymore.  Continue pravastatin.  Plavix discontinued.  I am not sure why he is not on any antiplatelet with history of  CAD with a stent.  I will discharge him on aspirin 81 mg p.o. daily.   Hypertension- (present on admission): Blood pressure controlled.  Resume home medications.   Recent UTI: Has received at least 5 to 7 days of amoxicillin.  Will discontinue at discharge.    Facial weakness: His right-sided facial droop and weakness and inability to close his right eye is going on for about 3 months.  For some reason, one of the new nurses was alarmed this as a sign of a stroke and code stroke was called, neurology was consulted.  CT head x2 negative.  Patient declined MRI.  No suspicion of stroke.  Neurology signed off.  Right-sided Bell's palsy/cataracts OU: Due to right-sided weakness of the face secondary to recent surgery he has developed Bell's palsy.  He appeared to have some infection in the right eye, for this ophthalmology on-call/Dr. Talbert Forest was called who saw him yesterday.  I am copying his complete note below.  Patient will need to see him back in the office in 1 week as well as oculoplastic specialist as outpatient.  He is being discharged on artificial tears and antibiotic ointment.  "Jeffrey Zimmerman is a 86 y.o. male with medical history significant for advanced dementia with physical deconditioning, CAD s/p prior bare-metal stenting, HTN, or BCC s/p excision, hard of hearing and Right side Bells Palsy for past 3 months s/p parotid tumor removal.  Pt has discharge from the right eye and pain.  Pt recently started on Erythromycin ointment qid and artificial tears.     On exam pt was 20/200 in both eyes though difficult to assess.  Pt states has cataracts but hasn't been back to the eye doctor in years.  Pt also says no longer wears his glasses.  Pt can see TV with both eyes but can't read captions which patient states is his baseline.  IOP soft to palpation and equal in both eyes.  Extraocular motility intact with full ductions and versions.  PERRL.     Pt with lagophthalmus and poor blink in the right eye. Pt with chemosis inferiorly and right lower lid ectropion.  Cornea clear with no epithelial defect or ulceration.  Left eye anterior segment wnl.  Pt with dense cataracts in both eyes.  The retina is flat and intact in both eyes but difficult view based on  cataracts and hospital setting.     A/P Bells Palsy right side.  Pt to use Erythromycin ointment qid and Lacrilube qid alternating so getting ointment q 2hours.  Pt needs to see an oculoplastic specialist for possible tarsorrhaphy or gold weight to reduce exposure keratopathy.  I will check with pt on Wednesday to ensure stable. Cataracts OU:  observe for now.  "  Addendum: Please note that patient was initially discharged to [facility] yesterday on 08/12/2021 however after everything was completed, I was informed by TOC that [facility] is having state investigators at the facility so they cannot accept new admissions.  Patient ended up staying in the hospital overnight.  I am being told now that arrangements have been made for the patient to go to [facility] today.  There is no change in medical condition compared to yesterday and patient is as stable as he was yesterday and will be discharged today.  Discharge plan was discussed with patient and/or family member and they verbalized understanding and agreed with it.  Discharge Diagnoses:  Principal Problem:   AKI (acute kidney injury) ([place]) Active Problems:  Hypertension   Coronary artery disease   Mixed hyperlipidemia   Alzheimer's dementia with behavioral disturbance (HCC)   BPH (benign prostatic hyperplasia)   Right-sided Bell's palsy   Cataract    Discharge Instructions   Allergies as of 08/13/2021       Reactions   Codeine Other (See Comments)   "Doesn't help with with pain control"        Medication List     STOP taking these medications    amoxicillin 500 MG capsule Commonly known as: AMOXIL   clopidogrel 75 MG tablet Commonly known as: PLAVIX   tamsulosin 0.4 MG Caps capsule Commonly known as: FLOMAX       TAKE these medications    acetaminophen 650 MG CR tablet Commonly known as: TYLENOL Take 650 mg by mouth every 8 (eight) hours as needed (osteoarthritis pain).   amLODipine 5 MG  tablet Commonly known as: NORVASC TAKE 1 TABLET BY MOUTH DAILY   artificial tears Oint ophthalmic ointment Commonly known as: LACRILUBE Place into the right eye 4 (four) times daily for 15 days.   aspirin EC 81 MG tablet Take 1 tablet (81 mg total) by mouth daily. Swallow whole.   AZO Cranberry Gummies 250 MG Chew Generic drug: Cranberry Chew 250 mg by mouth in the morning.   AZO CRANBERRY GUMMIES PO Take 1 tablet by mouth every morning.   citalopram 10 MG tablet Commonly known as: CELEXA Take 10 mg by mouth every morning.   dutasteride 0.5 MG capsule Commonly known as: AVODART Take 0.5 mg by mouth every morning.   finasteride 5 MG tablet Commonly known as: PROSCAR Take 5 mg by mouth every morning.   fluticasone 50 MCG/ACT nasal spray Commonly known as: FLONASE Place 1 spray into both nostrils every morning.   lactase 3000 units tablet Commonly known as: LACTAID Take 3,000-9,000 Units by mouth 3 (three) times daily with meals.   lisinopril 20 MG tablet Commonly known as: ZESTRIL Take 1 tablet (20 mg total) by mouth daily.   metoprolol tartrate 25 MG tablet Commonly known as: LOPRESSOR TAKE 1/2 TABLET BY MOUTH TWICE A DAY   nystatin ointment Commonly known as: MYCOSTATIN Apply 1 application topically 2 (two) times daily. What changed: additional instructions   polyethylene glycol 17 g packet Commonly known as: MIRALAX / GLYCOLAX Take 17 g by mouth every morning.   pravastatin 20 MG tablet Commonly known as: PRAVACHOL TAKE 1 TABLET (20 MG TOTAL) BY MOUTH DAILY. PLEASE SCHEDULE APPOINTMENT FOR FUTURE REFILLS. What changed: See the new instructions.   sennosides-docusate sodium 8.6-50 MG tablet Commonly known as: SENOKOT-S Take 2 tablets by mouth at bedtime.   silodosin 8 MG Caps capsule Commonly known as: RAPAFLO Take 8 mg by mouth every morning.   tetrahydrozoline-zinc 0.05-0.25 % ophthalmic solution Commonly known as: VISINE-AC Place 1 drop into  the right eye in the morning, at noon, in the evening, and at bedtime.   Vitamin D3 50 MCG (2000 UT) Tabs Take 2,000 Units by mouth in the morning.       ASK your doctor about these medications    erythromycin ophthalmic ointment Place 1 application into the right eye 4 (four) times daily for 7 days. Ask about: Should I take this medication?        Contact information for follow-up providers     [name], MD Follow up in 1 week(s).   Specialty: Family Medicine Contact information: [place] [address] [phone]  [name], MD .   Specialties: Cardiology, Radiology, Interventional Cardiology Contact information: [address] [place] [address] [phone]         Darleen Crocker, MD Follow up in 1 week(s).   Specialty: Ophthalmology Contact information: [place] [address] [phone]              Contact information for after-discharge care     Destination     [facility] .   Service: Skilled Nursing Contact information: [place] [address] [phone]                    Allergies  Allergen Reactions   Codeine Other (See Comments)    "Doesn't help with with pain control"    Consultations: Neurology and ophthalmology   Procedures/Studies: DG Chest 2 View  Result Date: 08/07/2021 CLINICAL DATA:  Chest pain EXAM: CHEST - 2 VIEW COMPARISON:  08/03/2021 FINDINGS: The heart size and mediastinal contours are within normal limits. Both lungs are clear. Disc degenerative disease of the thoracic spine. IMPRESSION: No acute abnormality of the lungs. Electronically Signed   By: Delanna Ahmadi M.D.   On: 08/07/2021 14:42   DG Chest 2 View  Result Date: 08/02/2021 CLINICAL DATA:  Shortness of breath.  Weakness. EXAM: CHEST - 2 VIEW COMPARISON:  07/08/2021 FINDINGS: The heart size and mediastinal contours  are within normal limits. Coronary artery stents noted. Mild scarring again noted in left lung base. Both lungs are otherwise clear. The visualized skeletal structures are unremarkable. IMPRESSION: No active cardiopulmonary disease. Electronically Signed   By: Marlaine Hind M.D.   On: 08/02/2021 11:22   CT HEAD WO CONTRAST (5MM)  Result Date: 08/10/2021 CLINICAL DATA:  86 year old male with RIGHT facial droop. Refusing MRI. EXAM: CT HEAD WITHOUT CONTRAST TECHNIQUE: Contiguous axial images were obtained from the base of the skull through the vertex without intravenous contrast. RADIATION DOSE REDUCTION: This exam was performed according to the departmental dose-optimization program which includes automated exposure control, adjustment of the mA and/or kV according to patient size and/or use of iterative reconstruction technique. COMPARISON:  08/08/2021 and prior CTs FINDINGS: Brain: No evidence of acute infarction, hemorrhage, hydrocephalus, extra-axial collection or mass lesion/mass effect. Atrophy and mild chronic small-vessel white matter ischemic changes are again noted. Vascular: Carotid and vertebral atherosclerotic calcifications are noted. Skull: Normal. Negative for fracture or focal lesion. Sinuses/Orbits: No acute finding. Other: None. IMPRESSION: 1. No evidence of acute intracranial abnormality. 2. Atrophy and chronic small-vessel white matter ischemic changes. Electronically Signed   By: Margarette Canada M.D.   On: 08/10/2021 13:34   DG Chest Port 1 View  Result Date: 08/03/2021 CLINICAL DATA:  86 year old male with history of poor p.o. intake. EXAM: PORTABLE CHEST 1 VIEW COMPARISON:  Chest x-ray 08/02/2021. FINDINGS: Lung volumes are low. Elevation of the right hemidiaphragm. No consolidative airspace disease. No pleural effusions. No pneumothorax. No pulmonary nodule or mass noted. Pulmonary vasculature and the cardiomediastinal silhouette are within normal limits. Atherosclerosis in the thoracic  aorta. IMPRESSION: 1. Low lung volumes without radiographic evidence of acute cardiopulmonary disease. 2. Elevation of the right hemidiaphragm. Electronically Signed   By: Vinnie Langton M.D.   On: 08/03/2021 07:20   CT HEAD CODE STROKE WO CONTRAST  Result Date: 08/08/2021 CLINICAL DATA:  Code stroke. Initial evaluation for acute neuro deficit, stroke suspected. EXAM: CT HEAD WITHOUT CONTRAST TECHNIQUE: Contiguous axial images were  obtained from the base of the skull through the vertex without intravenous contrast. RADIATION DOSE REDUCTION: This exam was performed according to the departmental dose-optimization program which includes automated exposure control, adjustment of the mA and/or kV according to patient size and/or use of iterative reconstruction technique. COMPARISON:  Prior study from 11/20/2019. FINDINGS: Brain: Age-related cerebral atrophy with mild chronic small vessel ischemic disease. No acute intracranial hemorrhage or large vessel territory infarct. No mass lesion or midline shift. Mild ventricular prominence related global parenchymal volume loss of hydrocephalus. No extra-axial fluid collection. Vascular: No hyperdense vessel. Calcified atherosclerosis present at the skull base. Skull: Scalp soft tissues and calvarium within normal limits. Sinuses/Orbits: Globes and orbital soft tissues demonstrate no acute finding. Paranasal sinuses and mastoid air cells are clear. Other: None. ASPECTS [facility] Stroke Program Early CT Score) - Ganglionic level infarction (caudate, lentiform nuclei, internal capsule, insula, M1-M3 cortex): 7 - Supraganglionic infarction (M4-M6 cortex): 3 Total score (0-10 with 10 being normal): 10 IMPRESSION: 1. No acute intracranial abnormality. 2. ASPECTS is 10. 3. Age-related cerebral atrophy with mild chronic small vessel ischemic disease. These results were communicated to Dr. Leonel Ramsay at 9:41 pm on 08/08/2021 by text page via the [facility] messaging system.  Electronically Signed   By: Jeannine Boga M.D.   On: 08/08/2021 21:42     Discharge Exam: Vitals:   08/12/21 1954 08/12/21 2342  BP: 129/77 (!) 147/78  Pulse: 97 94  Resp: 17 18  Temp: 98.2 F (36.8 C) 98 F (36.7 C)  SpO2: 95% 96%   Vitals:   08/12/21 0818 08/12/21 1500 08/12/21 1954 08/12/21 2342  BP: 137/77 123/73 129/77 (!) 147/78  Pulse: 78 90 97 94  Resp: (!) 22 18 17 18   Temp: 98.3 F (36.8 C)  98.2 F (36.8 C) 98 F (36.7 C)  TempSrc: Oral  Oral Oral  SpO2: 93% 94% 95% 96%    General: Pt is alert, awake, not in acute distress Cardiovascular: RRR, S1/S2 +, no rubs, no gallops Respiratory: CTA bilaterally, no wheezing, no rhonchi Abdominal: Soft, NT, ND, bowel sounds + Extremities: no edema, no cyanosis    The results of significant diagnostics from this hospitalization (including imaging, microbiology, ancillary and laboratory) are listed below for reference.     Microbiology: Recent Results (from the past 240 hour(s))  Resp Panel by RT-PCR (Flu A&B, Covid) Nasopharyngeal Swab     Status: None   Collection Time: 08/07/21  3:12 PM   Specimen: Nasopharyngeal Swab; Nasopharyngeal(NP) swabs in vial transport medium  Result Value Ref Range Status   SARS Coronavirus 2 by RT PCR NEGATIVE NEGATIVE Final    Comment: (NOTE) SARS-CoV-2 target nucleic acids are NOT DETECTED.  The SARS-CoV-2 RNA is generally detectable in upper respiratory specimens during the acute phase of infection. The lowest concentration of SARS-CoV-2 viral copies this assay can detect is 138 copies/mL. A negative result does not preclude SARS-Cov-2 infection and should not be used as the sole basis for treatment or other patient management decisions. A negative result may occur with  improper specimen collection/handling, submission of specimen other than nasopharyngeal swab, presence of viral mutation(s) within the areas targeted by this assay, and inadequate number of  viral copies(<138 copies/mL). A negative result must be combined with clinical observations, patient history, and epidemiological information. The expected result is Negative.  Fact Sheet for Patients:  EntrepreneurPulse.com.au  Fact Sheet for Healthcare Providers:  IncredibleEmployment.be  This test is no t yet approved or cleared by the [place]  FDA and  has been authorized for detection and/or diagnosis of SARS-CoV-2 by FDA under an Emergency Use Authorization (EUA). This EUA will remain  in effect (meaning this test can be used) for the duration of the COVID-19 declaration under Section 564(b)(1) of the Act, 21 U.S.C.section 360bbb-3(b)(1), unless the authorization is terminated  or revoked sooner.       Influenza A by PCR NEGATIVE NEGATIVE Final   Influenza B by PCR NEGATIVE NEGATIVE Final    Comment: (NOTE) The Xpert Xpress SARS-CoV-2/FLU/RSV plus assay is intended as an aid in the diagnosis of influenza from Nasopharyngeal swab specimens and should not be used as a sole basis for treatment. Nasal washings and aspirates are unacceptable for Xpert Xpress SARS-CoV-2/FLU/RSV testing.  Fact Sheet for Patients: EntrepreneurPulse.com.au  Fact Sheet for Healthcare Providers: IncredibleEmployment.be  This test is not yet approved or cleared by the [place] FDA and has been authorized for detection and/or diagnosis of SARS-CoV-2 by FDA under an Emergency Use Authorization (EUA). This EUA will remain in effect (meaning this test can be used) for the duration of the COVID-19 declaration under Section 564(b)(1) of the Act, 21 U.S.C. section 360bbb-3(b)(1), unless the authorization is terminated or revoked.  Performed at [facility], [place] [address] [place], [place] [address]   Urine Culture     Status: None   Collection Time: 08/07/21  5:15 PM   Specimen: Urine, Catheterized  Result  Value Ref Range Status   Specimen Description URINE, CATHETERIZED  Final   Special Requests NONE  Final   Culture   Final    NO GROWTH Performed at [facility], [address], [place], [place] [address]    Report Status 08/09/2021 FINAL  Final  SARS CORONAVIRUS 2 (TAT 6-24 HRS) Nasopharyngeal Nasopharyngeal Swab     Status: None   Collection Time: 08/11/21  4:19 PM   Specimen: Nasopharyngeal Swab  Result Value Ref Range Status   SARS Coronavirus 2 NEGATIVE NEGATIVE Final    Comment: (NOTE) SARS-CoV-2 target nucleic acids are NOT DETECTED.  The SARS-CoV-2 RNA is generally detectable in upper and lower respiratory specimens during the acute phase of infection. Negative results do not preclude SARS-CoV-2 infection, do not rule out co-infections with other pathogens, and should not be used as the sole basis for treatment or other patient management decisions. Negative results must be combined with clinical observations, patient history, and epidemiological information. The expected result is Negative.  Fact Sheet for Patients: SugarRoll.be  Fact Sheet for Healthcare Providers: https://www.woods-mathews.com/  This test is not yet approved or cleared by the [place] FDA and  has been authorized for detection and/or diagnosis of SARS-CoV-2 by FDA under an Emergency Use Authorization (EUA). This EUA will remain  in effect (meaning this test can be used) for the duration of the COVID-19 declaration under Se ction 564(b)(1) of the Act, 21 U.S.C. section 360bbb-3(b)(1), unless the authorization is terminated or revoked sooner.  Performed at [facility], [place] [address], [place], [place] [address]      Labs: BNP (last 3 results) No results for input(s): BNP in the last 8760 hours. Basic Metabolic Panel: Recent Labs  Lab 08/07/21 1409 08/08/21 0603 08/09/21 0923 08/10/21 0833 08/11/21 0025 08/13/21 0905  NA 143 143  139 138 137 135  K 3.7 3.1* 3.5 3.3* 3.5 3.6  CL 106 107 103 100 102 98  CO2 25 24 27 25 23 25   GLUCOSE 152* 82 96 77  69* 121*  BUN 27* 24* 12 9 12 19   CREATININE 1.81* 0.96 0.70 0.81 0.77 0.94  CALCIUM 9.3 8.1* 8.3* 8.0* 8.3* 9.3  MG 2.0  --  1.7  --   --  2.0   Liver Function Tests: No results for input(s): AST, ALT, ALKPHOS, BILITOT, PROT, ALBUMIN in the last 168 hours. No results for input(s): LIPASE, AMYLASE in the last 168 hours. No results for input(s): AMMONIA in the last 168 hours. CBC: Recent Labs  Lab 08/07/21 1409 08/08/21 0603 08/09/21 0923  WBC 12.0* 6.6 4.5  NEUTROABS 10.0*  --  3.2  HGB 14.2 11.2* 11.8*  HCT 42.9 35.3* 36.3*  MCV 90.5 94.4 89.9  PLT 331 188 203   Cardiac Enzymes: No results for input(s): CKTOTAL, CKMB, CKMBINDEX, TROPONINI in the last 168 hours. BNP: Invalid input(s): POCBNP CBG: Recent Labs  Lab 08/08/21 2107  GLUCAP 99   D-Dimer No results for input(s): DDIMER in the last 72 hours. Hgb A1c No results for input(s): HGBA1C in the last 72 hours. Lipid Profile No results for input(s): CHOL, HDL, LDLCALC, TRIG, CHOLHDL, LDLDIRECT in the last 72 hours. Thyroid function studies No results for input(s): TSH, T4TOTAL, T3FREE, THYROIDAB in the last 72 hours.  Invalid input(s): FREET3 Anemia work up No results for input(s): VITAMINB12, FOLATE, FERRITIN, TIBC, IRON, RETICCTPCT in the last 72 hours. Urinalysis    Component Value Date/Time   COLORURINE YELLOW 08/07/2021 1715   APPEARANCEUR CLEAR 08/07/2021 1715   LABSPEC >1.030 (H) 08/07/2021 1715   PHURINE 5.5 08/07/2021 1715   GLUCOSEU NEGATIVE 08/07/2021 1715   HGBUR MODERATE (A) 08/07/2021 1715   BILIRUBINUR MODERATE (A) 08/07/2021 1715   KETONESUR 15 (A) 08/07/2021 1715   PROTEINUR 100 (A) 08/07/2021 1715   NITRITE NEGATIVE 08/07/2021 1715   LEUKOCYTESUR TRACE (A) 08/07/2021 1715   Sepsis Labs Invalid input(s): PROCALCITONIN,  WBC,  LACTICIDVEN Microbiology Recent Results  (from the past 240 hour(s))  Resp Panel by RT-PCR (Flu A&B, Covid) Nasopharyngeal Swab     Status: None   Collection Time: 08/07/21  3:12 PM   Specimen: Nasopharyngeal Swab; Nasopharyngeal(NP) swabs in vial transport medium  Result Value Ref Range Status   SARS Coronavirus 2 by RT PCR NEGATIVE NEGATIVE Final    Comment: (NOTE) SARS-CoV-2 target nucleic acids are NOT DETECTED.  The SARS-CoV-2 RNA is generally detectable in upper respiratory specimens during the acute phase of infection. The lowest concentration of SARS-CoV-2 viral copies this assay can detect is 138 copies/mL. A negative result does not preclude SARS-Cov-2 infection and should not be used as the sole basis for treatment or other patient management decisions. A negative result may occur with  improper specimen collection/handling, submission of specimen other than nasopharyngeal swab, presence of viral mutation(s) within the areas targeted by this assay, and inadequate number of viral copies(<138 copies/mL). A negative result must be combined with clinical observations, patient history, and epidemiological information. The expected result is Negative.  Fact Sheet for Patients:  EntrepreneurPulse.com.au  Fact Sheet for Healthcare Providers:  IncredibleEmployment.be  This test is no t yet approved or cleared by the [place] FDA and  has been authorized for detection and/or diagnosis of SARS-CoV-2 by FDA under an Emergency Use Authorization (EUA). This EUA will remain  in effect (meaning this test can be used) for the duration of the COVID-19 declaration under Section 564(b)(1) of the Act, 21 U.S.C.section 360bbb-3(b)(1), unless the authorization is terminated  or revoked sooner.  Influenza A by PCR NEGATIVE NEGATIVE Final   Influenza B by PCR NEGATIVE NEGATIVE Final    Comment: (NOTE) The Xpert Xpress SARS-CoV-2/FLU/RSV plus assay is intended as an aid in the  diagnosis of influenza from Nasopharyngeal swab specimens and should not be used as a sole basis for treatment. Nasal washings and aspirates are unacceptable for Xpert Xpress SARS-CoV-2/FLU/RSV testing.  Fact Sheet for Patients: EntrepreneurPulse.com.au  Fact Sheet for Healthcare Providers: IncredibleEmployment.be  This test is not yet approved or cleared by the [place] FDA and has been authorized for detection and/or diagnosis of SARS-CoV-2 by FDA under an Emergency Use Authorization (EUA). This EUA will remain in effect (meaning this test can be used) for the duration of the COVID-19 declaration under Section 564(b)(1) of the Act, 21 U.S.C. section 360bbb-3(b)(1), unless the authorization is terminated or revoked.  Performed at [facility], [place] [address], [place], [place] [address]   Urine Culture     Status: None   Collection Time: 08/07/21  5:15 PM   Specimen: Urine, Catheterized  Result Value Ref Range Status   Specimen Description URINE, CATHETERIZED  Final   Special Requests NONE  Final   Culture   Final    NO GROWTH Performed at [facility], [address] [place], [place] [address]    Report Status 08/09/2021 FINAL  Final  SARS CORONAVIRUS 2 (TAT 6-24 HRS) Nasopharyngeal Nasopharyngeal Swab     Status: None   Collection Time: 08/11/21  4:19 PM   Specimen: Nasopharyngeal Swab  Result Value Ref Range Status   SARS Coronavirus 2 NEGATIVE NEGATIVE Final    Comment: (NOTE) SARS-CoV-2 target nucleic acids are NOT DETECTED.  The SARS-CoV-2 RNA is generally detectable in upper and lower respiratory specimens during the acute phase of infection. Negative results do not preclude SARS-CoV-2 infection, do not rule out co-infections with other pathogens, and should not be used as the sole basis for treatment or other patient management decisions. Negative results must be combined with clinical observations, patient  history, and epidemiological information. The expected result is Negative.  Fact Sheet for Patients: SugarRoll.be  Fact Sheet for Healthcare Providers: https://www.woods-mathews.com/  This test is not yet approved or cleared by the [place] FDA and  has been authorized for detection and/or diagnosis of SARS-CoV-2 by FDA under an Emergency Use Authorization (EUA). This EUA will remain  in effect (meaning this test can be used) for the duration of the COVID-19 declaration under Se ction 564(b)(1) of the Act, 21 U.S.C. section 360bbb-3(b)(1), unless the authorization is terminated or revoked sooner.  Performed at [facility], [place] [address] [place], [place] [address]      Time coordinating discharge: Over 30 minutes  SIGNED:   Darliss Cheney, MD  [facility] 08/13/2021, 10:36 AM  If 7PM-7AM, please contact night-coverage [email or web address]

## 2021-08-13 NOTE — Plan of Care (Signed)

## 2021-08-13 NOTE — Progress Notes (Signed)
Physical Therapy Treatment Patient Details Name: Jeffrey Zimmerman MRN: [id number] DOB: July 24, 1935 Today's Date: 08/13/2021   History of Present Illness Pt is a 86 y/o male presenting on 08/07/21 with cough, nausea. Recent admission 1/14-1/17 with AMS due to UTI/dehydration. Found with AKI. PMH includes: advanced dementia with behavioral disturbance, CAD, HTN, hard of hearing, gout, IBS, MI, colon cancer.    PT Comments    Patient up in chair on arrival. Able to progress to walking outside room this date (120 ft with RW with min assist). Repeated transfers for sequencing and strengthening.    Recommendations for follow up therapy are one component of a multi-disciplinary discharge planning process, led by the attending physician.  Recommendations may be updated based on patient status, additional functional criteria and insurance authorization.  Follow Up Recommendations  Skilled nursing-short term rehab (<3 hours/day)     Assistance Recommended at Discharge Frequent or constant Supervision/Assistance  Patient can return home with the following Direct supervision/assist for medications management;Assistance with cooking/housework;Direct supervision/assist for financial management;Assist for transportation;Help with stairs or ramp for entrance;A little help with walking and/or transfers;A lot of help with bathing/dressing/bathroom   Equipment Recommendations  None recommended by PT    Recommendations for Other Services       Precautions / Restrictions Precautions Precautions: Fall Precaution Comments: incontinence     Mobility  Bed Mobility               General bed mobility comments: up in recliner    Transfers Overall transfer level: Needs assistance Equipment used: Rolling walker (2 wheels) Transfers: Sit to/from Stand Sit to Stand: Min assist, Min guard           General transfer comment: min assist with cues for hand placement to stand from recliner; with  repetition progressed to minguard assist    Ambulation/Gait Ambulation/Gait assistance: Min assist Gait Distance (Feet): 120 Feet Assistive device: Rolling walker (2 wheels) Gait Pattern/deviations: Step-through pattern, Decreased stride length, Trunk flexed Gait velocity: decreased     General Gait Details: cues for posture, assist with turning RW in tight space   Stairs             Wheelchair Mobility    Modified Rankin (Stroke Patients Only)       Balance Overall balance assessment: Needs assistance Sitting-balance support: No upper extremity supported, Feet supported Sitting balance-Leahy Scale: Good Sitting balance - Comments: edge of chair; reaches down to feet to don shoes   Standing balance support: No upper extremity supported Standing balance-Leahy Scale: Fair Standing balance comment: reliant on RW for dynamic balance                            Cognition Arousal/Alertness: Awake/alert Behavior During Therapy: WFL for tasks assessed/performed Overall Cognitive Status: No family/caregiver present to determine baseline cognitive functioning                                 General Comments: HOH-difficult to assess; comments appropriate for situation        Exercises General Exercises - Lower Extremity Hip Flexion/Marching: AROM, Both, Seated, 15 reps Other Exercises Other Exercises: sit to stand x 10 reps (consecutive)    General Comments        Pertinent Vitals/Pain Pain Assessment Pain Assessment: No/denies pain Faces Pain Scale: No hurt Breathing: normal Negative Vocalization: none Facial Expression: smiling or  inexpressive Body Language: relaxed Consolability: no need to console PAINAD Score: 0    Home Living                          Prior Function            PT Goals (current goals can now be found in the care plan section) Acute Rehab PT Goals Patient Stated Goal: to eat breakfast Time For  Goal Achievement: 08/22/21 Potential to Achieve Goals: Fair Progress towards PT goals: Progressing toward goals    Frequency    Min 2X/week      PT Plan Current plan remains appropriate    Co-evaluation              AM-PAC PT "6 Clicks" Mobility   Outcome Measure  Help needed turning from your back to your side while in a flat bed without using bedrails?: A Little Help needed moving from lying on your back to sitting on the side of a flat bed without using bedrails?: A Little Help needed moving to and from a bed to a chair (including a wheelchair)?: A Little Help needed standing up from a chair using your arms (e.g., wheelchair or bedside chair)?: A Little Help needed to walk in hospital room?: A Little Help needed climbing 3-5 steps with a railing? : Total 6 Click Score: 16    End of Session Equipment Utilized During Treatment: Gait belt Activity Tolerance: Patient tolerated treatment well Patient left: in chair;with call bell/phone within reach;with chair alarm set Nurse Communication: Mobility status;Other (comment) (primofit off on arrival) PT Visit Diagnosis: Other abnormalities of gait and mobility (R26.89);Muscle weakness (generalized) (M62.81);Difficulty in walking, not elsewhere classified (R26.2)     Time: 5366-4403 PT Time Calculation (min) (ACUTE ONLY): 19 min  Charges:  $Gait Training: 8-22 mins                      Jeffrey Zimmerman, PT Acute Rehabilitation Services  Pager [phone] Office [phone]    Jeffrey Zimmerman 08/13/2021, 8:53 AM

## 2021-08-13 NOTE — Progress Notes (Signed)
EVS printed by this RN. Patient ride arranged via [facility]. IV's removed with no complaints of pain or excessive bleeding. Personal items gathered by [facility] driver. No complaints or concerns stated at this time. Patient escorted by [facility] driver via wheelchair

## 2021-08-14 ENCOUNTER — Other Ambulatory Visit: Payer: Self-pay

## 2021-08-14 ENCOUNTER — Ambulatory Visit (INDEPENDENT_AMBULATORY_CARE_PROVIDER_SITE_OTHER): Payer: Medicare (Managed Care) | Admitting: Plastic Surgery

## 2021-08-14 DIAGNOSIS — C44310 Basal cell carcinoma of skin of unspecified parts of face: Secondary | ICD-10-CM

## 2021-08-14 NOTE — Progress Notes (Signed)
° °  Referring Provider Gaynelle Arabian, MD [address] [employer] [place],  [place] [address]   CC: No chief complaint on file.     Jeffrey Zimmerman is an 86 y.o. male.  HPI: Patient presents in follow-up from her right cheek basal cell carcinoma excision.  Margins were clear and pathology.  He has some residual upper division facial nerve weakness and has been taping his eye and using ointment.  He has been in the hospital over the past month and has been to emergency room with dehydration.  His functional status seems to have deteriorated a bit since surgery.  Review of Systems General: Denies fevers and chills  Physical Exam Vitals with BMI 08/12/2021 08/12/2021 08/12/2021  Height - - -  Weight - - -  BMI - - -  Systolic 038 882 800  Diastolic 78 77 73  Pulse 94 97 90    General:  No acute distress,  Alert and oriented, Non-Toxic, Normal speech and affect I remove the residual silicone layer from his right cheek.  The Integra looks to be cooperating fine.  Incisions have healed nicely.  He has some notable difficulty closing his eye and smiling.  He is able to project his lower lip well and symmetrically.  I suspect his function will gradually return.  Assessment/Plan Patient presents over a month postop from right cheek basal cell carcinoma excision with Integra placement.  From the skin standpoint he seems to be healing well.  I expect his facial nerve function will come back with time as the nerves were well visualized during the case.  He is living in an assisted living facility.  We will plan to do wound care on the Integra surface with Vaseline, Adaptic and a small gauze to cover it.  Would like this done daily.  He is able to shower and bathe normally.  We will plan to see him again in 3 weeks.  All of his questions were answered.  Cindra Presume 08/14/2021, 4:18 PM

## 2021-08-18 ENCOUNTER — Other Ambulatory Visit: Payer: Self-pay

## 2021-08-18 ENCOUNTER — Ambulatory Visit (HOSPITAL_COMMUNITY): Payer: Medicare (Managed Care) | Attending: Internal Medicine

## 2021-08-18 DIAGNOSIS — Z01818 Encounter for other preprocedural examination: Secondary | ICD-10-CM | POA: Diagnosis present

## 2021-08-18 DIAGNOSIS — I255 Ischemic cardiomyopathy: Secondary | ICD-10-CM | POA: Diagnosis not present

## 2021-08-18 DIAGNOSIS — Z01810 Encounter for preprocedural cardiovascular examination: Secondary | ICD-10-CM

## 2021-08-18 DIAGNOSIS — R600 Localized edema: Secondary | ICD-10-CM | POA: Insufficient documentation

## 2021-08-18 LAB — ECHOCARDIOGRAM COMPLETE
Area-P 1/2: 2.22 cm2
S' Lateral: 2.9 cm

## 2021-09-02 NOTE — Progress Notes (Unsigned)
Patient is an 86 year old male with PMH of invasive basal cell carcinoma s/p reconstruction with application of skin substitute 06/23/2021 performed by Dr. Claudia Desanctis who presents to clinic for postoperative follow-up.  Patient was seen most recently for postoperative follow-up on 08/14/2021.  At that time, he was experiencing residual upper division facial nerve weakness with difficulty closing his eye as well as difficulty smiling.  Dr. Claudia Desanctis suspects that his function will slowly improve.  The Integra was incorporating nicely.  Plan was for localized wound care with Vaseline, Adaptic, and gauze performed once daily.  He currently resides at assisted living facility.  Today,

## 2021-09-04 ENCOUNTER — Other Ambulatory Visit: Payer: Self-pay

## 2021-09-04 ENCOUNTER — Ambulatory Visit (INDEPENDENT_AMBULATORY_CARE_PROVIDER_SITE_OTHER): Payer: Medicare (Managed Care) | Admitting: Physician Assistant

## 2021-09-04 ENCOUNTER — Ambulatory Visit: Payer: Medicare (Managed Care) | Admitting: Physician Assistant

## 2021-09-04 DIAGNOSIS — H11421 Conjunctival edema, right eye: Secondary | ICD-10-CM

## 2021-09-04 DIAGNOSIS — C44310 Basal cell carcinoma of skin of unspecified parts of face: Secondary | ICD-10-CM

## 2021-09-04 NOTE — Progress Notes (Signed)
Patient is an 86 year old male with PMH of right cheek basal cell carcinoma s/p excision with debridement and Integra placement performed by Dr. Claudia Desanctis 06/23/2021 who presents to clinic for postoperative follow-up.  He was last seen here in clinic 08/14/2021 for his initial postoperative follow-up as he had been delayed due to prolonged hospitalization.  The silicone layer was removed from the Integra which appears to be incorporating.  His incisions had healed nicely, but there was some notable difficulty closing his eye and smiling.  It was suspected that his function will begin to slowly return with time.  Recommending continued Vaseline, Adaptic, and gauze for local wound care.  Today, patient still expresses difficulty closing his right eye and swelling symmetrically.  Patient reports that he is having to use artificial tears daily for his right eye.  He states that wound care has not been provided to the excision site.  He was brought in today by a transporter for his skilled nursing facility.  Patient arrives wheelchair-bound.  Physical exam shows hardened exudative crusting at right cheek excision site.  No significant surrounding erythema or other findings concerning for infection.  Right eye with large chemosis noted.  Persistent asymmetry with smiling.    Patient with a persistent palsy incurred from debridement of his right cheek basal cell carcinoma.  His inability to close right eye is being managed at the skilled nursing facility with artificial tears.  They have not been addressing his wound site.  Debridement performed here at bedside.  After debridement, wound appears much improved.  There is good evidence of healing.  Applied Adaptic followed by K-Y jelly, 4 x 4 gauze, and secured with a piece of Medipore tape.    Provided strict instructions for the SNF to provide similar wound care daily.  They can call the clinic with any questions.  Picture(s) obtained of the patient and placed in the  chart were with the patient's or guardian's permission.

## 2021-09-22 NOTE — Progress Notes (Signed)
Patient is an 86 year old male with PMH of right cheek basal cell carcinoma s/p excision with debridement and Integra placement performed by Dr. Claudia Desanctis 06/23/2021 who presents to clinic for postoperative follow-up. ? ?Patient was last seen here in clinic on 09/04/2021.  At that time, patient was still having signs and symptoms consistent with persistent palsy incurred from debridement of his BCC. ? ?Today, patient arrives in wheelchair accompanied by his transporter from the SNF.  Patient has been receiving daily Xeroform dressing changes at the skilled facility.  Patient continues to endorse asymmetric smile as well as difficulty closing his right eye.  He denies any pain symptoms and reports that he is not particularly bothered by his ongoing palsy. ? ?Physical exam reveals healed right cheek BCC excision site.  Good epithelialization.  No surrounding cellulitic changes. ? ?Recommending thin film of Vaseline daily to the excision site.  No further dressing changes needed.  As for his ongoing facial nerve palsy, it has not shown any considerable improvement compared to last encounter.  He has follow-up scheduled with Dr. Constance Holster in three weeks.  May consider referral to oculoplastics later if palsy does not improve.   ? ?No specific follow-up needed here in clinic for his right cheek BCC excision.  He can return as needed.  Picture(s) obtained of the patient and placed in the chart were with the patient's or guardian's permission. ? ?

## 2021-09-25 ENCOUNTER — Ambulatory Visit: Payer: Medicare (Managed Care) | Admitting: Physician Assistant

## 2021-09-25 ENCOUNTER — Ambulatory Visit (INDEPENDENT_AMBULATORY_CARE_PROVIDER_SITE_OTHER): Payer: Medicare (Managed Care) | Admitting: Physician Assistant

## 2021-09-25 DIAGNOSIS — C44310 Basal cell carcinoma of skin of unspecified parts of face: Secondary | ICD-10-CM

## 2021-11-20 ENCOUNTER — Telehealth: Payer: Self-pay | Admitting: Plastic Surgery

## 2021-11-20 NOTE — Telephone Encounter (Signed)
Received call from [facility] at [facility]. Biopsy results received are incomplete. Requesting full report. Please advise at [phone].  ?

## 2021-12-03 ENCOUNTER — Telehealth: Payer: Self-pay

## 2021-12-03 NOTE — Telephone Encounter (Signed)
Crystal from [facility] called requesting that pathology report of basal cell on his face be faxed to her @ [phone]. Call back # is [phone].  ? ?Thanks!  ?

## 2021-12-03 NOTE — Telephone Encounter (Signed)
Spoke with Crystal from [facility] to confirm fax number. Attempted to fax biopsy results to [phone] but received "no answer" fax report. Left vm at [phone] to inform fax unsuccessful. ?

## 2021-12-19 ENCOUNTER — Telehealth: Payer: Self-pay | Admitting: *Deleted

## 2021-12-19 NOTE — Telephone Encounter (Signed)
Multiple attempts to fax pathology results since 12/03/21 to [facility] unsuccessful. Path results delivered to practice and handed to Hannah Beat at front desk on 12/18/21.

## 2021-12-30 ENCOUNTER — Other Ambulatory Visit: Payer: Self-pay | Admitting: Nurse Practitioner

## 2021-12-30 NOTE — Progress Notes (Unsigned)
Cardiology Office Note   Date:  12/30/2021   ID:  Jeffrey Zimmerman, DOB 1935/09/02, MRN [id number]  PCP:  [name], MD    No chief complaint on file.  CAD/Old MI  Wt Readings from Last 3 Encounters:  08/05/21 166 lb 10.7 oz (75.6 kg)  07/08/21 209 lb 7 oz (95 kg)  07/01/21 188 lb 0.8 oz (85.3 kg)       History of Present Illness: Jeffrey Zimmerman is a 86 y.o. male  With a h/o anterior STEMI in 2011 with several LAD stents placed.   He has had Atrial tachycardia: Noted by monitor in 2013.   He has had issues with leg edema.  He has some knee problem and swelling at the knee joints.   Had trouble gaining weight in the past.   Records show as of 3/23, he is: " post right parotidectomy for a facial skin cancer. The masseteric branch was sacrificed due to the cancer. He is living in a skilled facility currently. His memory is not so good."  Denies : Chest pain. Dizziness. Leg edema. Nitroglycerin use. Orthopnea. Palpitations. Paroxysmal nocturnal dyspnea. Shortness of breath. Syncope.     Past Medical History:  Diagnosis Date   Adenomatous colon polyp 08/2008   Arthritis    Basal cell carcinoma    Cancer (HCC)    colon   Coronary artery disease    04/21/10 Anter MI with BMS to LAD with residual proximal LAD EF 40%   Gout    History of cholecystectomy    History of colonoscopy    1/5 AND 2/10   Hypertension    Irritable bowel syndrome    Myocardial infarct [facility])    SVT (supraventricular tachycardia) ([place])     Past Surgical History:  Procedure Laterality Date   ADJACENT TISSUE TRANSFER/TISSUE REARRANGEMENT Right 06/23/2021   Procedure: ADJACENT TISSUE TRANSFER/TISSUE REARRANGEMENT;  Surgeon: Cindra Presume, MD;  Location: [place];  Service: Plastics;  Laterality: Right;   APPENDECTOMY     CARDIAC CATHETERIZATION     04/21/10 Anter MI with BMS to LAD with residual proximal LAD EF 40%   CHOLECYSTECTOMY     pt does not remember when   [place] Right 06/23/2021   Procedure: Right cheek reconstruction;  Surgeon: Cindra Presume, MD;  Location: [place];  Service: Plastics;  Laterality: Right;   KNEE ARTHOSCOPY Bilateral    PAROTIDECTOMY N/A 06/23/2021   Procedure: PAROTIDECTOMY  RIGHT SUPERFICIAL WITH FACIAL NERVE DISSECTION;  Surgeon: Izora Gala, MD;  Location: [place] [facility];  Service: ENT;  Laterality: N/A;   PARTIAL LEFT NEPHRECTOMY     (for oncocytoma-benign)     Current Outpatient Medications  Medication Sig Dispense Refill   acetaminophen (TYLENOL) 650 MG CR tablet Take 650 mg by mouth every 8 (eight) hours as needed (osteoarthritis pain).     amLODipine (NORVASC) 5 MG tablet TAKE 1 TABLET BY MOUTH DAILY 90 tablet 3   aspirin EC 81 MG tablet Take 1 tablet (81 mg total) by mouth daily. Swallow whole. 30 tablet 11   AZO CRANBERRY GUMMIES PO Take 1 tablet by mouth every morning.     Cholecalciferol (VITAMIN D3) 50 MCG (2000 UT) TABS Take 2,000 Units by mouth in the morning.     citalopram (CELEXA) 10 MG tablet Take 10 mg by mouth every morning.     Cranberry (AZO CRANBERRY GUMMIES) 250 MG CHEW Chew 250 mg by mouth in the morning.  dutasteride (AVODART) 0.5 MG capsule Take 0.5 mg by mouth every morning.     finasteride (PROSCAR) 5 MG tablet Take 5 mg by mouth every morning.     fluticasone (FLONASE) 50 MCG/ACT nasal spray Place 1 spray into both nostrils every morning.     lactase (LACTAID) 3000 units tablet Take 3,000-9,000 Units by mouth 3 (three) times daily with meals.     lisinopril (ZESTRIL) 20 MG tablet Take 1 tablet (20 mg total) by mouth daily.     metoprolol tartrate (LOPRESSOR) 25 MG tablet TAKE 1/2 TABLET BY MOUTH TWICE A DAY (Patient taking differently: Take 12.5 mg by mouth 2 (two) times daily.) 90 tablet 3   nystatin ointment (MYCOSTATIN) Apply 1 application topically 2 (two) times daily. (Patient taking differently: Apply 1 application topically 2 (two) times daily. Apply to feet) 30 g 1   polyethylene glycol  (MIRALAX / GLYCOLAX) 17 g packet Take 17 g by mouth every morning.     pravastatin (PRAVACHOL) 20 MG tablet TAKE 1 TABLET (20 MG TOTAL) BY MOUTH DAILY. PLEASE SCHEDULE APPOINTMENT FOR FUTURE REFILLS. (Patient taking differently: Take 20 mg by mouth every morning.) 90 tablet 3   sennosides-docusate sodium (SENOKOT-S) 8.6-50 MG tablet Take 2 tablets by mouth at bedtime.     silodosin (RAPAFLO) 8 MG CAPS capsule Take 8 mg by mouth every morning.     tetrahydrozoline-zinc (VISINE-AC) 0.05-0.25 % ophthalmic solution Place 1 drop into the right eye in the morning, at noon, in the evening, and at bedtime.     No current facility-administered medications for this visit.    Allergies:   Codeine    Social History:  The patient  reports that he has never smoked. He has never used smokeless tobacco. He reports that he does not drink alcohol and does not use drugs.   Family History:  The patient's family history includes CAD in his mother; Colon cancer in his father; Heart attack in his mother; Rheum arthritis in his maternal grandfather.    ROS:  Please see the history of present illness.   Otherwise, review of systems are positive for right facial droop.   All other systems are reviewed and negative.    PHYSICAL EXAM: VS:  There were no vitals taken for this visit. , BMI There is no height or weight on file to calculate BMI. GEN: Well nourished, well developed, in no acute distress, hard of hearing HEENT: normal Neck: no JVD, carotid bruits, or masses Cardiac: RRR; no murmurs, rubs, or gallops,no edema  Respiratory:  clear to auscultation bilaterally, normal work of breathing GI: soft, nontender, nondistended, + BS MS: no deformity or atrophy Skin: warm and dry, no rash Neuro:  right sided facial droop, difficulty completely closing right eye, swelling under right eye Psych: euthymic mood, full affect    Recent Labs: 06/18/2021: TSH 2.270 08/02/2021: ALT 34 08/09/2021: Hemoglobin 11.8;  Platelets 203 08/13/2021: BUN 19; Creatinine, Ser 0.94; Magnesium 2.0; Potassium 3.6; Sodium 135   Lipid Panel    Component Value Date/Time   CHOL 161 04/10/2016 0951   TRIG 107 04/10/2016 0951   HDL 52 04/10/2016 0951   CHOLHDL 3.1 04/10/2016 0951   VLDL 21 04/10/2016 0951   LDLCALC 88 04/10/2016 0951     Other studies Reviewed: Additional studies/ records that were reviewed today with results demonstrating: labs reviewed- no recent lipids.   ASSESSMENT AND PLAN:  CAD/Old MI: restart aspirin 81 mg daily.  Statn was stopped. Will add rosuvastatin 20  mg daily.  Hyperlipidemia: Start rosuvastatin.  Recheck labs in 2 to 3 months. Hypertension: Controlled.  Needs routine follow-up. Atrial tachycardia: Noted on a monitor in 2013.  LE edema: None noted on exam today.  Elevate legs when he sees swelling. Facial droop: Bells plasy-like appearance. Status post skin cancer surgery where facial nerve had to be sacrificed.  Overall, seems somewhat more frail at this visit.   Current medicines are reviewed at length with the patient today.  The patient concerns regarding his medicines were addressed.  The following changes have been made:  No change  Labs/ tests ordered today include:  No orders of the defined types were placed in this encounter.   Recommend 150 minutes/week of aerobic exercise Low fat, low carb, high fiber diet recommended  Disposition:   FU in 1 year   Signed, Larae Grooms, MD  12/30/2021 12:24 PM    [facility] [place], [place], [place]  [address] Phone: [phone]; Fax: [phone]

## 2021-12-31 ENCOUNTER — Ambulatory Visit (INDEPENDENT_AMBULATORY_CARE_PROVIDER_SITE_OTHER): Payer: Medicare (Managed Care) | Admitting: Interventional Cardiology

## 2021-12-31 ENCOUNTER — Other Ambulatory Visit: Payer: Self-pay | Admitting: *Deleted

## 2021-12-31 VITALS — BP 130/88 | Ht 74.0 in | Wt 167.0 lb

## 2021-12-31 DIAGNOSIS — I252 Old myocardial infarction: Secondary | ICD-10-CM | POA: Diagnosis not present

## 2021-12-31 DIAGNOSIS — I10 Essential (primary) hypertension: Secondary | ICD-10-CM

## 2021-12-31 DIAGNOSIS — E782 Mixed hyperlipidemia: Secondary | ICD-10-CM

## 2021-12-31 DIAGNOSIS — I2510 Atherosclerotic heart disease of native coronary artery without angina pectoris: Secondary | ICD-10-CM | POA: Diagnosis not present

## 2021-12-31 DIAGNOSIS — R600 Localized edema: Secondary | ICD-10-CM

## 2021-12-31 DIAGNOSIS — I471 Supraventricular tachycardia: Secondary | ICD-10-CM

## 2021-12-31 DIAGNOSIS — I4719 Other supraventricular tachycardia: Secondary | ICD-10-CM

## 2021-12-31 MED ORDER — ROSUVASTATIN CALCIUM 20 MG PO TABS: 20.0000 mg | ORAL_TABLET | Freq: Every day | ORAL | 3 refills | Status: DC

## 2021-12-31 NOTE — Patient Instructions (Signed)
Medication Instructions:  Your physician has recommended you make the following change in your medication:  Start Rosuvastatin 20 mg by mouth daily.  (You should not be taking pravastatin) Start aspirin 81 mg by mouth daily.  This can be purchased over the counter  *If you need a refill on your cardiac medications before your next appointment, please call your pharmacy*   Lab Work: Have fasting lab work checked in 2-3 months.  Lipid and liver profiles.  You have an order for this If you have labs (blood work) drawn today and your tests are completely normal, you will receive your results only by: [place] (if you have MyChart) OR A paper copy in the mail If you have any lab test that is abnormal or we need to change your treatment, we will call you to review the results.   Testing/Procedures: none   Follow-Up: At [facility], you and your health needs are our priority.  As part of our continuing mission to provide you with exceptional heart care, we have created designated Provider Care Teams.  These Care Teams include your primary Cardiologist (physician) and Advanced Practice Providers (APPs -  Physician Assistants and Nurse Practitioners) who all work together to provide you with the care you need, when you need it.  We recommend signing up for the patient portal called "MyChart".  Sign up information is provided on this After Visit Summary.  MyChart is used to connect with patients for Virtual Visits (Telemedicine).  Patients are able to view lab/test results, encounter notes, upcoming appointments, etc.  Non-urgent messages can be sent to your provider as well.   To learn more about what you can do with MyChart, go to [email or web address].    Your next appointment:   12 month(s)  The format for your next appointment:   In Person  Provider:   Larae Grooms, MD     Other Instructions   Important Information About Sugar

## 2022-01-02 ENCOUNTER — Other Ambulatory Visit: Payer: Self-pay | Admitting: Nurse Practitioner

## 2022-01-02 ENCOUNTER — Other Ambulatory Visit (HOSPITAL_COMMUNITY): Payer: Self-pay | Admitting: Nurse Practitioner

## 2022-01-02 DIAGNOSIS — Z85828 Personal history of other malignant neoplasm of skin: Secondary | ICD-10-CM

## 2022-01-30 ENCOUNTER — Ambulatory Visit (HOSPITAL_COMMUNITY): Admission: RE | Admit: 2022-01-30 | Payer: Medicare (Managed Care) | Source: Ambulatory Visit

## 2022-01-30 ENCOUNTER — Encounter (HOSPITAL_COMMUNITY): Payer: Self-pay

## 2022-02-27 IMAGING — CR DG KNEE COMPLETE 4+V*R*
4 series · 4 of 4 positions shown · non-contrast
Comparison: None.

CLINICAL DATA: Recent fall with right knee pain, initial encounter

EXAM:
RIGHT KNEE - COMPLETE 4+ VIEW

[knee ap]
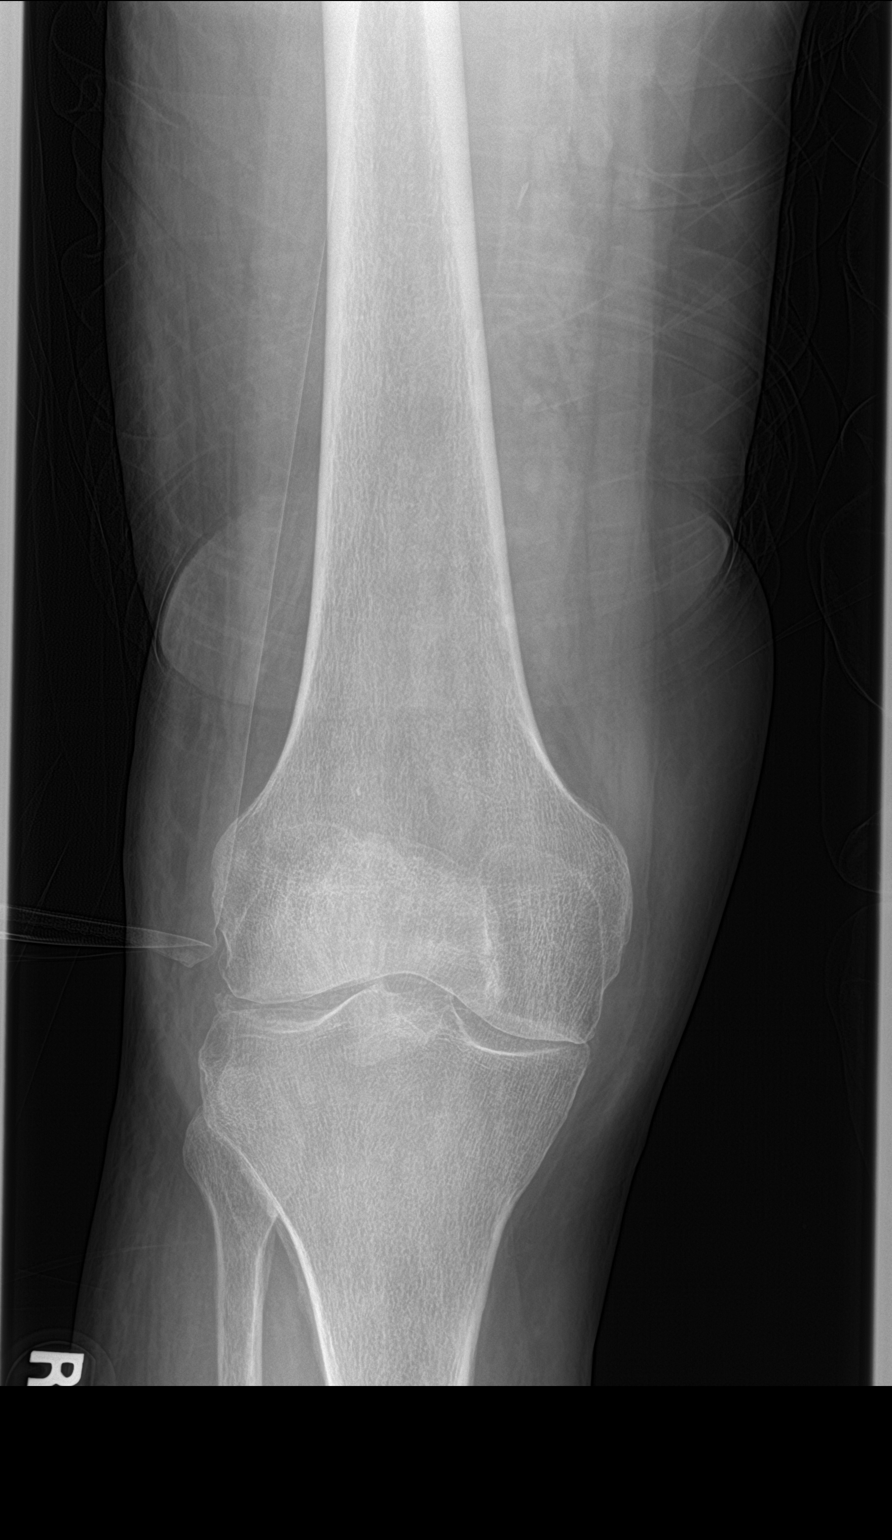

[knee lat]
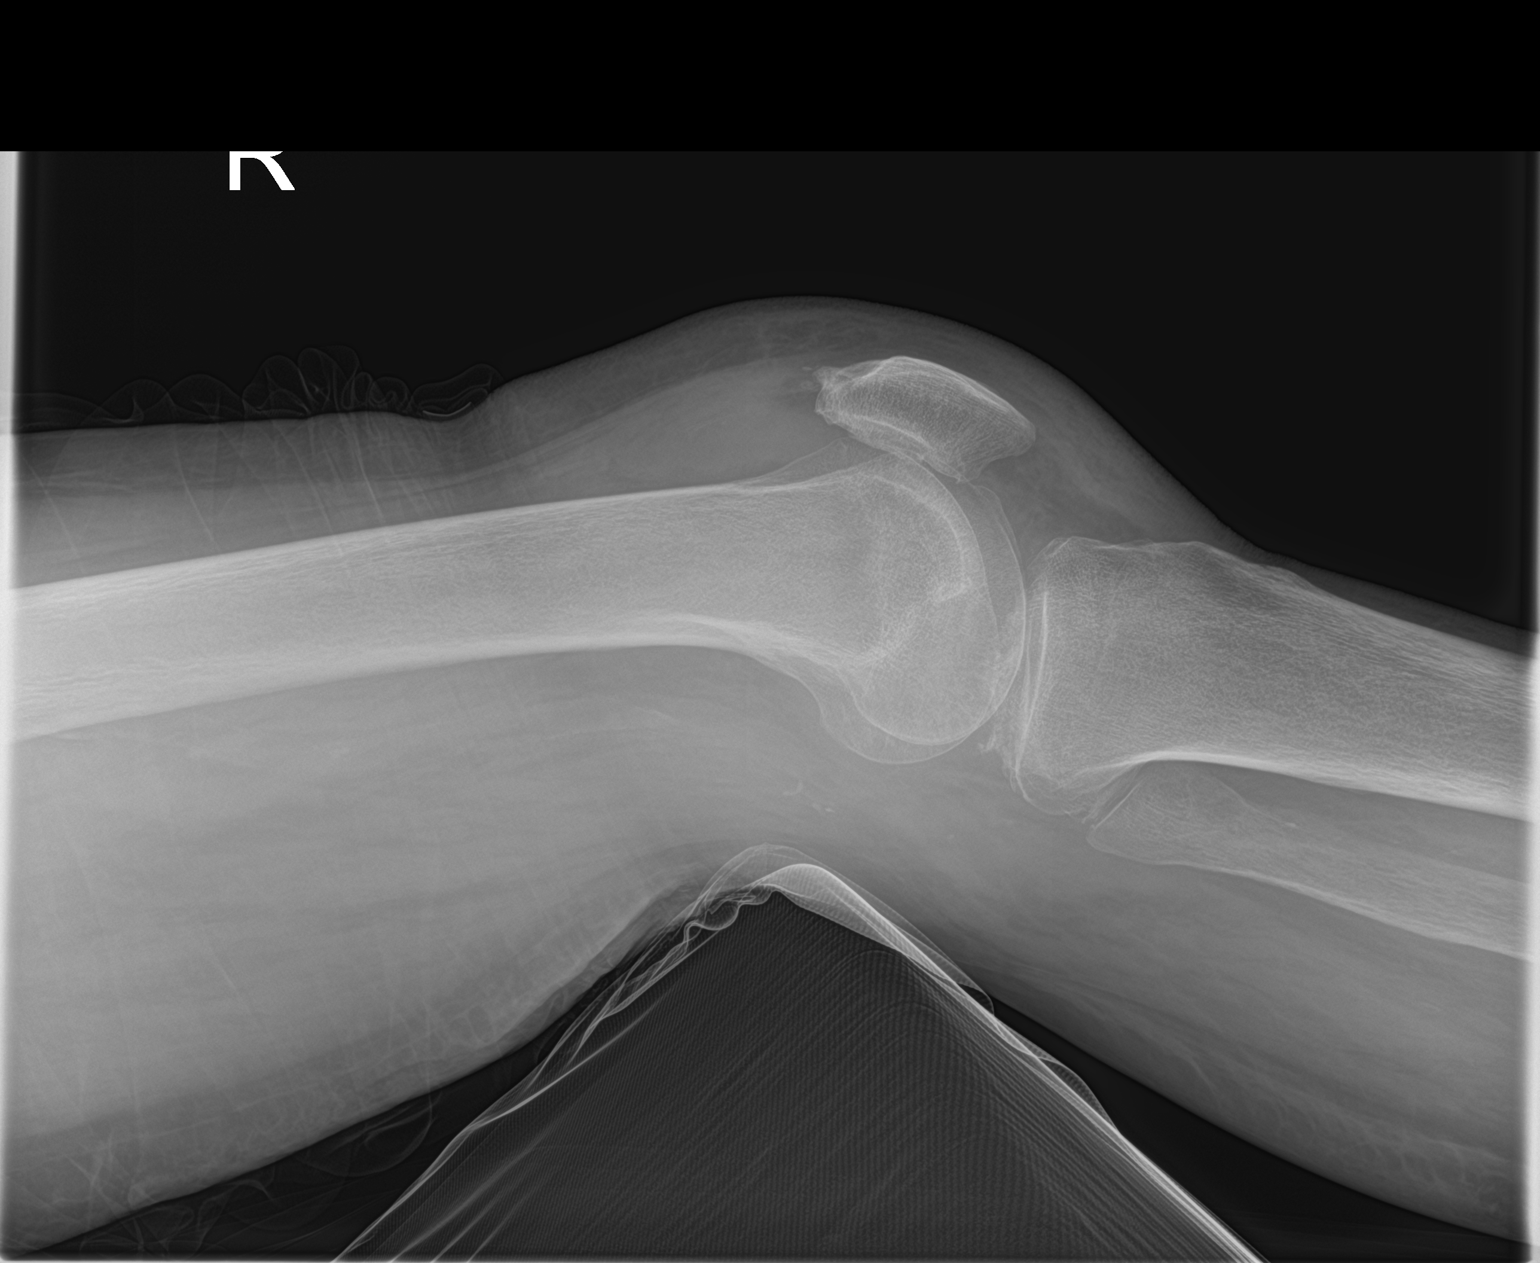

[knee obl (1 of 2)]
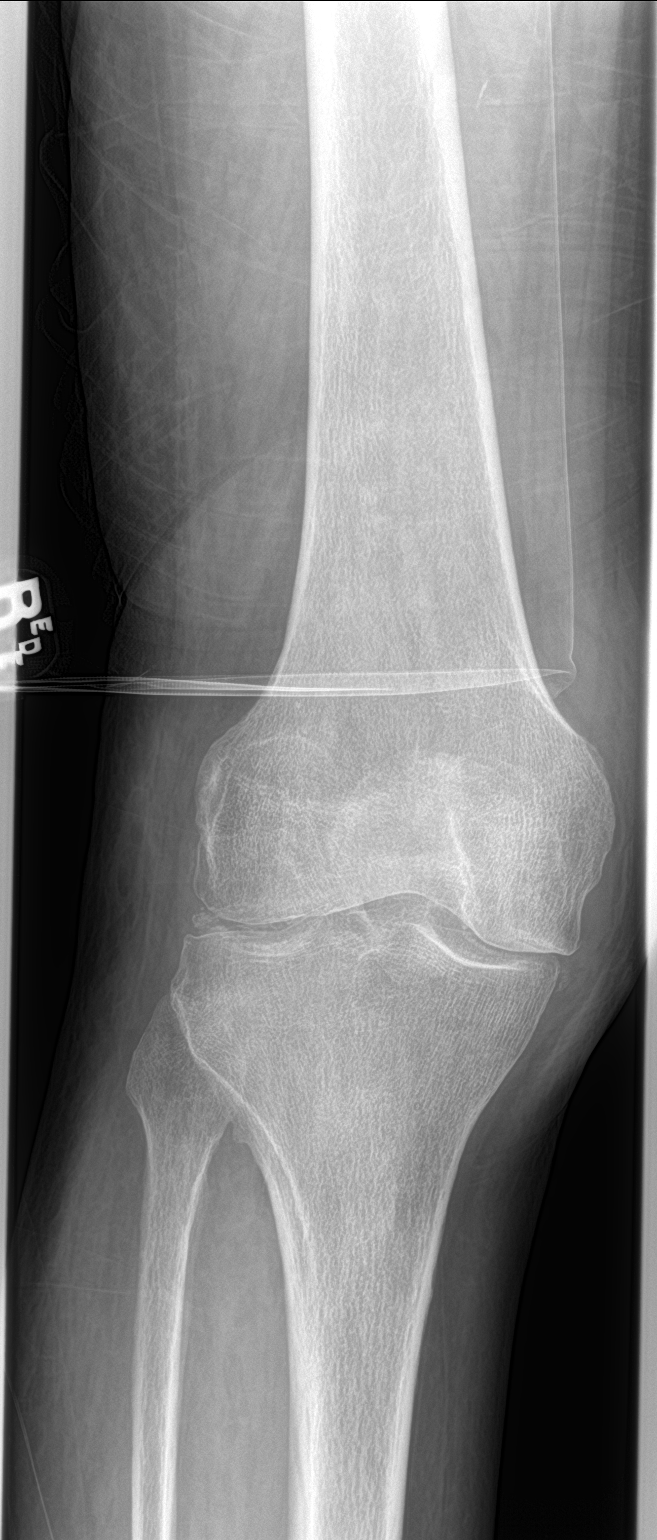

[knee obl (2 of 2)]
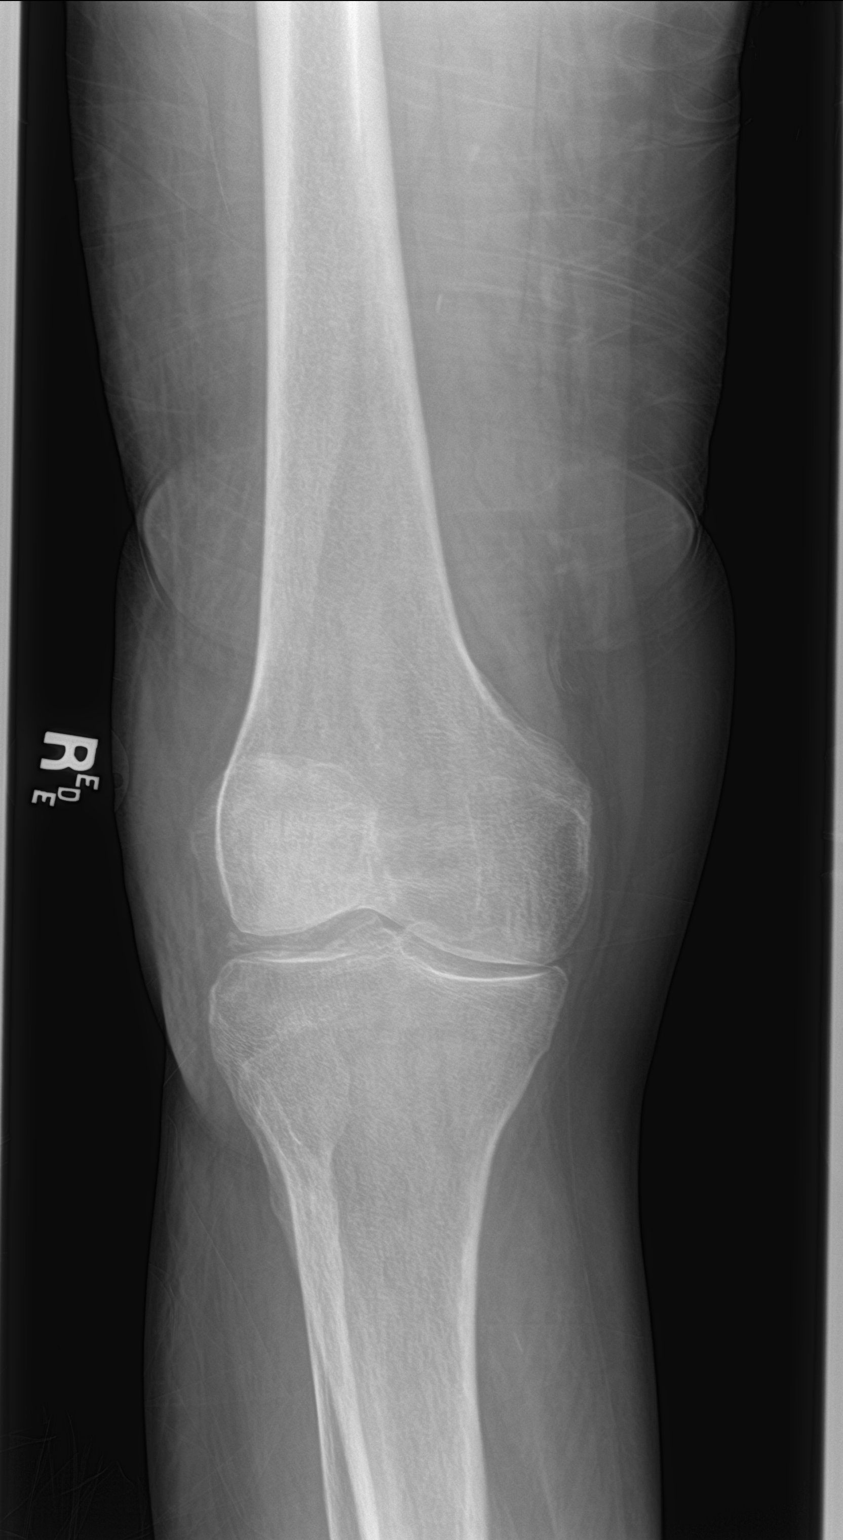

[4 of 4 positions shown; findings below may reference images not displayed]

FINDINGS: Moderate joint effusion is noted. Tricompartmental degenerative
changes are noted. No acute fracture or dislocation is seen. No
other soft tissue abnormality is noted.
IMPRESSION: Degenerative change with joint effusion. No acute bony abnormality
seen.

## 2022-03-07 ENCOUNTER — Encounter (HOSPITAL_COMMUNITY): Payer: Self-pay

## 2022-03-07 ENCOUNTER — Emergency Department (HOSPITAL_COMMUNITY): Payer: Medicare (Managed Care)

## 2022-03-07 ENCOUNTER — Inpatient Hospital Stay (HOSPITAL_COMMUNITY)
Admission: EM | Admit: 2022-03-07 | Discharge: 2022-03-13 | DRG: 177 | Disposition: A | Discharge status: Skilled Nursing Facility | Payer: Medicare (Managed Care) | Source: Skilled Nursing Facility | Attending: Internal Medicine | Admitting: Internal Medicine

## 2022-03-07 DIAGNOSIS — I10 Essential (primary) hypertension: Secondary | ICD-10-CM | POA: Diagnosis present

## 2022-03-07 DIAGNOSIS — Z86010 Personal history of colonic polyps: Secondary | ICD-10-CM

## 2022-03-07 DIAGNOSIS — R627 Adult failure to thrive: Secondary | ICD-10-CM | POA: Diagnosis present

## 2022-03-07 DIAGNOSIS — J1282 Pneumonia due to coronavirus disease 2019: Secondary | ICD-10-CM | POA: Diagnosis present

## 2022-03-07 DIAGNOSIS — Z8261 Family history of arthritis: Secondary | ICD-10-CM

## 2022-03-07 DIAGNOSIS — J189 Pneumonia, unspecified organism: Principal | ICD-10-CM

## 2022-03-07 DIAGNOSIS — U071 COVID-19: Secondary | ICD-10-CM | POA: Diagnosis present

## 2022-03-07 DIAGNOSIS — J9601 Acute respiratory failure with hypoxia: Secondary | ICD-10-CM | POA: Diagnosis present

## 2022-03-07 DIAGNOSIS — Z9049 Acquired absence of other specified parts of digestive tract: Secondary | ICD-10-CM

## 2022-03-07 DIAGNOSIS — F02C4 Dementia in other diseases classified elsewhere, severe, with anxiety: Secondary | ICD-10-CM | POA: Diagnosis present

## 2022-03-07 DIAGNOSIS — R0902 Hypoxemia: Secondary | ICD-10-CM

## 2022-03-07 DIAGNOSIS — M109 Gout, unspecified: Secondary | ICD-10-CM | POA: Diagnosis present

## 2022-03-07 DIAGNOSIS — I471 Supraventricular tachycardia, unspecified: Secondary | ICD-10-CM | POA: Diagnosis present

## 2022-03-07 DIAGNOSIS — Z79899 Other long term (current) drug therapy: Secondary | ICD-10-CM

## 2022-03-07 DIAGNOSIS — E876 Hypokalemia: Secondary | ICD-10-CM | POA: Diagnosis present

## 2022-03-07 DIAGNOSIS — N400 Enlarged prostate without lower urinary tract symptoms: Secondary | ICD-10-CM | POA: Diagnosis present

## 2022-03-07 DIAGNOSIS — G309 Alzheimer's disease, unspecified: Secondary | ICD-10-CM | POA: Diagnosis present

## 2022-03-07 DIAGNOSIS — F02818 Dementia in other diseases classified elsewhere, unspecified severity, with other behavioral disturbance: Secondary | ICD-10-CM | POA: Diagnosis not present

## 2022-03-07 DIAGNOSIS — Z85828 Personal history of other malignant neoplasm of skin: Secondary | ICD-10-CM | POA: Diagnosis not present

## 2022-03-07 DIAGNOSIS — M1990 Unspecified osteoarthritis, unspecified site: Secondary | ICD-10-CM | POA: Diagnosis present

## 2022-03-07 DIAGNOSIS — E782 Mixed hyperlipidemia: Secondary | ICD-10-CM | POA: Diagnosis present

## 2022-03-07 DIAGNOSIS — I252 Old myocardial infarction: Secondary | ICD-10-CM

## 2022-03-07 DIAGNOSIS — K589 Irritable bowel syndrome without diarrhea: Secondary | ICD-10-CM | POA: Diagnosis present

## 2022-03-07 DIAGNOSIS — Z800 Family history of malignant neoplasm of digestive organs: Secondary | ICD-10-CM

## 2022-03-07 DIAGNOSIS — F02C3 Dementia in other diseases classified elsewhere, severe, with mood disturbance: Secondary | ICD-10-CM | POA: Diagnosis present

## 2022-03-07 DIAGNOSIS — I2510 Atherosclerotic heart disease of native coronary artery without angina pectoris: Secondary | ICD-10-CM | POA: Diagnosis present

## 2022-03-07 DIAGNOSIS — Z85038 Personal history of other malignant neoplasm of large intestine: Secondary | ICD-10-CM | POA: Diagnosis not present

## 2022-03-07 DIAGNOSIS — Z8249 Family history of ischemic heart disease and other diseases of the circulatory system: Secondary | ICD-10-CM

## 2022-03-07 DIAGNOSIS — F02C11 Dementia in other diseases classified elsewhere, severe, with agitation: Secondary | ICD-10-CM | POA: Diagnosis present

## 2022-03-07 DIAGNOSIS — F02C18 Dementia in other diseases classified elsewhere, severe, with other behavioral disturbance: Secondary | ICD-10-CM | POA: Diagnosis present

## 2022-03-07 DIAGNOSIS — R0602 Shortness of breath: Secondary | ICD-10-CM | POA: Diagnosis present

## 2022-03-07 DIAGNOSIS — F32A Depression, unspecified: Secondary | ICD-10-CM | POA: Diagnosis present

## 2022-03-07 DIAGNOSIS — Z885 Allergy status to narcotic agent status: Secondary | ICD-10-CM

## 2022-03-07 LAB — CBC WITH DIFFERENTIAL/PLATELET
Abs Immature Granulocytes: 0.01 10*3/uL (ref 0.00–0.07)
Basophils Absolute: 0 10*3/uL (ref 0.0–0.1)
Basophils Relative: 0 %
Eosinophils Absolute: 0 10*3/uL (ref 0.0–0.5)
Eosinophils Relative: 0 %
HCT: 36.8 % — ABNORMAL LOW (ref 39.0–52.0)
Hemoglobin: 12.4 g/dL — ABNORMAL LOW (ref 13.0–17.0)
Immature Granulocytes: 0 %
Lymphocytes Relative: 4 %
Lymphs Abs: 0.2 10*3/uL — ABNORMAL LOW (ref 0.7–4.0)
MCH: 31.8 pg (ref 26.0–34.0)
MCHC: 33.7 g/dL (ref 30.0–36.0)
MCV: 94.4 fL (ref 80.0–100.0)
Monocytes Absolute: 0.5 10*3/uL (ref 0.1–1.0)
Monocytes Relative: 9 %
Neutro Abs: 4.8 10*3/uL (ref 1.7–7.7)
Neutrophils Relative %: 87 %
Platelets: 113 10*3/uL — ABNORMAL LOW (ref 150–400)
RBC: 3.9 MIL/uL — ABNORMAL LOW (ref 4.22–5.81)
RDW: 13.7 % (ref 11.5–15.5)
WBC: 5.5 10*3/uL (ref 4.0–10.5)
nRBC: 0 % (ref 0.0–0.2)

## 2022-03-07 LAB — COMPREHENSIVE METABOLIC PANEL
ALT: 27 U/L (ref 0–44)
AST: 35 U/L (ref 15–41)
Albumin: 3.7 g/dL (ref 3.5–5.0)
Alkaline Phosphatase: 52 U/L (ref 38–126)
Anion gap: 10 (ref 5–15)
BUN: 24 mg/dL — ABNORMAL HIGH (ref 8–23)
CO2: 29 mmol/L (ref 22–32)
Calcium: 8.5 mg/dL — ABNORMAL LOW (ref 8.9–10.3)
Chloride: 103 mmol/L (ref 98–111)
Creatinine, Ser: 0.74 mg/dL (ref 0.61–1.24)
GFR, Estimated: >60 mL/min (ref >60)
Glucose, Bld: 119 mg/dL — ABNORMAL HIGH (ref 70–99)
Potassium: 2.9 mmol/L — ABNORMAL LOW (ref 3.5–5.1)
Sodium: 142 mmol/L (ref 135–145)
Total Bilirubin: 1.3 mg/dL — ABNORMAL HIGH (ref 0.3–1.2)
Total Protein: 6.3 g/dL — ABNORMAL LOW (ref 6.5–8.1)

## 2022-03-07 LAB — BRAIN NATRIURETIC PEPTIDE: B Natriuretic Peptide: 103.2 pg/mL — ABNORMAL HIGH (ref 0.0–100.0)

## 2022-03-07 LAB — PROTIME-INR
INR: 1.2 (ref 0.8–1.2)
Prothrombin Time: 14.6 seconds (ref 11.4–15.2)

## 2022-03-07 LAB — FERRITIN: Ferritin: 262 ng/mL (ref 24–336)

## 2022-03-07 LAB — LACTIC ACID, PLASMA
Lactic Acid, Venous: 0.9 mmol/L (ref 0.5–1.9)
Lactic Acid, Venous: 0.7 mmol/L (ref 0.5–1.9)

## 2022-03-07 LAB — D-DIMER, QUANTITATIVE: D-Dimer, Quant: 1.09 ug/mL-FEU — ABNORMAL HIGH (ref 0.00–0.50)

## 2022-03-07 LAB — RESP PANEL BY RT-PCR (FLU A&B, COVID) ARPGX2
Influenza A by PCR: NEGATIVE
Influenza B by PCR: NEGATIVE
SARS Coronavirus 2 by RT PCR: POSITIVE — AB

## 2022-03-07 LAB — PROCALCITONIN: Procalcitonin: <0.1 ng/mL

## 2022-03-07 LAB — APTT: aPTT: 34 seconds (ref 24–36)

## 2022-03-07 LAB — C-REACTIVE PROTEIN: CRP: 13.8 mg/dL — ABNORMAL HIGH (ref <1.0)

## 2022-03-07 LAB — MAGNESIUM: Magnesium: 2.4 mg/dL (ref 1.7–2.4)

## 2022-03-07 MED ORDER — TETRAHYDROZOLINE HCL 0.05 % OP SOLN
1.0000 [drp] | Freq: Three times a day (TID) | OPHTHALMIC | Status: DC | PRN
  Administered 2022-03-08 – 2022-03-13 (×2): 1 [drp] via OPHTHALMIC
  Filled 2022-03-07 (×2): qty 15

## 2022-03-07 MED ORDER — ROSUVASTATIN CALCIUM 20 MG PO TABS: 20.0000 mg | ORAL_TABLET | Freq: Every day | ORAL | Status: DC

## 2022-03-07 MED ORDER — POTASSIUM CHLORIDE CRYS ER 20 MEQ PO TBCR
40.0000 meq | EXTENDED_RELEASE_TABLET | Freq: Once | ORAL | Status: DC
  Filled 2022-03-07: qty 2

## 2022-03-07 MED ORDER — ONDANSETRON HCL 4 MG PO TABS: 4.0000 mg | ORAL_TABLET | Freq: Four times a day (QID) | ORAL | Status: DC | PRN

## 2022-03-07 MED ORDER — TIOTROPIUM BROMIDE MONOHYDRATE 1.25 MCG/ACT IN AERS: 2.0000 | INHALATION_SPRAY | Freq: Every day | RESPIRATORY_TRACT | Status: DC

## 2022-03-07 MED ORDER — ZINC SULFATE 220 (50 ZN) MG PO CAPS
220.0000 mg | ORAL_CAPSULE | Freq: Every day | ORAL | Status: DC
  Administered 2022-03-07 – 2022-03-13 (×5): 220 mg via ORAL
  Filled 2022-03-07 (×7): qty 1

## 2022-03-07 MED ORDER — IPRATROPIUM-ALBUTEROL 0.5-2.5 (3) MG/3ML IN SOLN
3.0000 mL | Freq: Four times a day (QID) | RESPIRATORY_TRACT | Status: DC
Start: 2022-03-07 — End: 2022-03-07

## 2022-03-07 MED ORDER — NIRMATRELVIR&RITONAVIR 150/100 10 X 150 MG & 10 X 100MG PO TBPK: 3.0000 | ORAL_TABLET | Freq: Two times a day (BID) | ORAL | Status: DC

## 2022-03-07 MED ORDER — ASPIRIN 81 MG PO TBEC
81.0000 mg | DELAYED_RELEASE_TABLET | Freq: Every day | ORAL | Status: DC
  Administered 2022-03-07 – 2022-03-13 (×5): 81 mg via ORAL
  Filled 2022-03-07 (×7): qty 1

## 2022-03-07 MED ORDER — POTASSIUM CHLORIDE 10 MEQ/100ML IV SOLN
10.0000 meq | INTRAVENOUS | Status: AC
  Administered 2022-03-07 (×2): 10 meq via INTRAVENOUS
  Filled 2022-03-07 (×2): qty 100

## 2022-03-07 MED ORDER — DEXAMETHASONE SODIUM PHOSPHATE 10 MG/ML IJ SOLN
6.0000 mg | INTRAMUSCULAR | Status: DC
  Administered 2022-03-07 – 2022-03-12 (×6): 6 mg via INTRAVENOUS
  Filled 2022-03-07 (×6): qty 1

## 2022-03-07 MED ORDER — ACETAMINOPHEN 325 MG PO TABS: 650.0000 mg | ORAL_TABLET | ORAL | Status: DC | PRN

## 2022-03-07 MED ORDER — HALOPERIDOL LACTATE 5 MG/ML IJ SOLN
3.0000 mg | Freq: Four times a day (QID) | INTRAMUSCULAR | Status: DC | PRN
Start: 2022-03-07 — End: 2022-03-13
  Administered 2022-03-07 – 2022-03-13 (×3): 3 mg via INTRAVENOUS
  Filled 2022-03-07 (×3): qty 1

## 2022-03-07 MED ORDER — IPRATROPIUM-ALBUTEROL 0.5-2.5 (3) MG/3ML IN SOLN: 3.0000 mL | Freq: Three times a day (TID) | RESPIRATORY_TRACT | Status: DC

## 2022-03-07 MED ORDER — LACTATED RINGERS IV BOLUS (SEPSIS)
1000.0000 mL | Freq: Once | INTRAVENOUS | Status: AC
  Administered 2022-03-07: 1000 mL via INTRAVENOUS

## 2022-03-07 MED ORDER — MOLNUPIRAVIR EUA 200MG CAPSULE: 4.0000 | ORAL_CAPSULE | Freq: Two times a day (BID) | ORAL | Status: DC

## 2022-03-07 MED ORDER — SODIUM CHLORIDE 0.9 % IV SOLN
500.0000 mg | Freq: Once | INTRAVENOUS | Status: AC
  Administered 2022-03-07: 500 mg via INTRAVENOUS
  Filled 2022-03-07: qty 5

## 2022-03-07 MED ORDER — SODIUM CHLORIDE 0.9 % IV SOLN
500.0000 mg | INTRAVENOUS | Status: DC
  Administered 2022-03-08: 500 mg via INTRAVENOUS
  Filled 2022-03-07 (×2): qty 5

## 2022-03-07 MED ORDER — ONDANSETRON HCL 4 MG/2ML IJ SOLN: 4.0000 mg | Freq: Four times a day (QID) | INTRAMUSCULAR | Status: DC | PRN

## 2022-03-07 MED ORDER — ACETAMINOPHEN 325 MG PO TABS
650.0000 mg | ORAL_TABLET | Freq: Four times a day (QID) | ORAL | Status: DC | PRN
  Administered 2022-03-13: 650 mg via ORAL
  Filled 2022-03-07: qty 2

## 2022-03-07 MED ORDER — ACETAMINOPHEN 650 MG RE SUPP: 650.0000 mg | Freq: Four times a day (QID) | RECTAL | Status: DC | PRN

## 2022-03-07 MED ORDER — ASCORBIC ACID 500 MG PO TABS: 500.0000 mg | ORAL_TABLET | Freq: Every day | ORAL | Status: DC

## 2022-03-07 MED ORDER — LORAZEPAM 2 MG/ML IJ SOLN
0.5000 mg | Freq: Once | INTRAMUSCULAR | Status: AC
  Administered 2022-03-07: 0.5 mg via INTRAMUSCULAR
  Filled 2022-03-07: qty 1

## 2022-03-07 MED ORDER — SODIUM CHLORIDE 0.9 % IV SOLN
1.0000 g | Freq: Once | INTRAVENOUS | Status: AC
  Administered 2022-03-07: 1 g via INTRAVENOUS
  Filled 2022-03-07: qty 10

## 2022-03-07 MED ORDER — IPRATROPIUM-ALBUTEROL 20-100 MCG/ACT IN AERS
1.0000 | INHALATION_SPRAY | Freq: Four times a day (QID) | RESPIRATORY_TRACT | Status: DC
  Administered 2022-03-08: 1 via RESPIRATORY_TRACT
  Filled 2022-03-07: qty 4

## 2022-03-07 MED ORDER — LACTATED RINGERS IV SOLN: INTRAVENOUS | Status: AC

## 2022-03-07 MED ORDER — FINASTERIDE 5 MG PO TABS
5.0000 mg | ORAL_TABLET | Freq: Every morning | ORAL | Status: DC
  Administered 2022-03-07 – 2022-03-13 (×5): 5 mg via ORAL
  Filled 2022-03-07 (×7): qty 1

## 2022-03-07 MED ORDER — CITALOPRAM HYDROBROMIDE 20 MG PO TABS
20.0000 mg | ORAL_TABLET | Freq: Every day | ORAL | Status: DC
  Administered 2022-03-07 – 2022-03-13 (×5): 20 mg via ORAL
  Filled 2022-03-07 (×7): qty 1

## 2022-03-07 MED ORDER — NIRMATRELVIR/RITONAVIR (PAXLOVID)TABLET
3.0000 | ORAL_TABLET | Freq: Two times a day (BID) | ORAL | Status: DC
  Filled 2022-03-07: qty 30

## 2022-03-07 MED ORDER — METOPROLOL TARTRATE 25 MG PO TABS
12.5000 mg | ORAL_TABLET | Freq: Two times a day (BID) | ORAL | Status: DC
  Administered 2022-03-07: 12.5 mg via ORAL
  Filled 2022-03-07 (×2): qty 1

## 2022-03-07 MED ORDER — ASCORBIC ACID 500 MG PO TABS
500.0000 mg | ORAL_TABLET | Freq: Every day | ORAL | Status: DC
  Administered 2022-03-07 – 2022-03-13 (×5): 500 mg via ORAL
  Filled 2022-03-07 (×7): qty 1

## 2022-03-07 MED ORDER — NIRMATRELVIR&RITONAVIR 150/100 10 X 150 MG & 10 X 100MG PO TBPK: 2.0000 | ORAL_TABLET | Freq: Two times a day (BID) | ORAL | Status: DC

## 2022-03-07 MED ORDER — POTASSIUM CHLORIDE IN NACL 40-0.9 MEQ/L-% IV SOLN
INTRAVENOUS | Status: AC
  Filled 2022-03-07: qty 1000

## 2022-03-07 MED ORDER — SODIUM CHLORIDE 0.9 % IV SOLN
2.0000 g | INTRAVENOUS | Status: DC
  Administered 2022-03-08: 2 g via INTRAVENOUS
  Filled 2022-03-07: qty 20

## 2022-03-07 MED ORDER — GUAIFENESIN-DM 100-10 MG/5ML PO SYRP: 10.0000 mL | ORAL_SOLUTION | ORAL | Status: DC | PRN

## 2022-03-07 NOTE — Progress Notes (Signed)
Pt unable to do flutter valve. Order was discontinued.

## 2022-03-07 NOTE — ED Triage Notes (Addendum)
Pt arrives via EMS from [facility]. Pt recently diagnosed with Covid. Staff contacted EMS due to his O2 saturation being 88%. Temp was 101 with ems, '650mg'$  of tylenol was administered in route. Pt has dementia, currently at his baseline. Pt denies symptoms. Swelling noted to right eye as well, no obvious injury.

## 2022-03-07 NOTE — ED Provider Notes (Signed)
[facility] DEPT Provider Note   CSN: [id number] Arrival date & time: 03/07/22  0915     History  Chief Complaint  Patient presents with   Shortness of Breath   Covid Positive    Jeffrey Zimmerman is a 86 y.o. male with history of Alzheimer's, CAD with previous MI, SVT who presents to the emergency department via EMS from [facility] for concern of decreased oxygen saturation.  Temp was 101 F with EMS and 650 mg was administered in route.  I called [facility] and spoke to his nurse and she states that he was tested for COVID on Thursday, 2 days ago due to weakness and decreased p.o. intake.  This morning, he was noted to have oxygen desaturation down to 88% so she put him on 2 L nasal cannula.  This only increased him up to about 90% so she increased to his nasal cannula O2 to 4 L which brought him back up to about 93 to 95%.  She denies cough.  Patient has severe dementia at baseline and is agitated and aggressive with staff.  Unable to answer ROS or further history questions.  No noted history of COPD or CHF.   Shortness of Breath      Home Medications Prior to Admission medications   Medication Sig Start Date End Date Taking? Authorizing Provider  acetaminophen (TYLENOL) 650 MG CR tablet Take 650 mg by mouth every 8 (eight) hours as needed (osteoarthritis pain).   Yes [provider]  citalopram (CELEXA) 20 MG tablet Take 20 mg by mouth daily.   Yes [provider]  LORazepam (ATIVAN) 0.5 MG tablet Take 1 tablet by mouth 2 (two) times daily. 11/20/21  Yes [provider]  nirmatrelvir & ritonavir (PAXLOVID, 150/100,) 10 x 150 MG & 10 x '100MG'$  TBPK Take 2 tablets by mouth in the morning and at bedtime. For 5 days starting 03/06/22   Yes [provider]  rosuvastatin (CRESTOR) 20 MG tablet Take 1 tablet (20 mg total) by mouth daily. 12/31/21  Yes [name], MD  Tetrahydrozoline HCl (VISINE RED EYE COMFORT OP)  Place 2 drops into the right eye in the morning and at bedtime.   Yes [provider]  Tiotropium Bromide Monohydrate (SPIRIVA RESPIMAT) 1.25 MCG/ACT AERS Inhale 2 each into the lungs daily.   Yes [provider]  White Petrolatum-Mineral Oil (TEARS NATURALE PM) 3-94 % OINT Place 1 Application into the right eye at bedtime.   Yes [provider]  albuterol (VENTOLIN HFA) 108 (90 Base) MCG/ACT inhaler Inhale 2 puffs into the lungs every 6 (six) hours as needed for wheezing or shortness of breath. Patient not taking: Reported on 03/07/2022    [provider]  amLODipine (NORVASC) 5 MG tablet TAKE 1 TABLET BY MOUTH DAILY Patient not taking: Reported on 12/31/2021 03/28/19   [name], MD  antiseptic oral rinse (BIOTENE) LIQD 15 mLs by Mouth Rinse route as needed for dry mouth. Patient not taking: Reported on 03/07/2022    [provider]  aspirin EC 81 MG tablet Take 1 tablet (81 mg total) by mouth daily. Swallow whole. Patient not taking: Reported on 12/31/2021 08/12/21 08/12/22  Darliss Cheney, MD  AZO CRANBERRY GUMMIES PO Take 1 tablet by mouth every morning. Patient not taking: Reported on 12/31/2021    [provider]  Cholecalciferol (VITAMIN D3) 50 MCG (2000 UT) TABS Take 2,000 Units by mouth in the morning. Patient not taking:  Reported on 12/31/2021    [provider]  citalopram (CELEXA) 10 MG tablet Take 10 mg by mouth every morning. Patient not taking: Reported on 03/07/2022    [provider]  Cranberry (AZO CRANBERRY GUMMIES) 250 MG CHEW Chew 250 mg by mouth in the morning. Patient not taking: Reported on 12/31/2021    [provider]  dutasteride (AVODART) 0.5 MG capsule Take 0.5 mg by mouth every morning. Patient not taking: Reported on 12/31/2021    [provider]  finasteride (PROSCAR) 5 MG tablet Take 5 mg by mouth every morning.    [provider]  fluticasone (FLONASE) 50 MCG/ACT  nasal spray Place 1 spray into both nostrils every morning.    [provider]  lactase (LACTAID) 3000 units tablet Take 3,000-9,000 Units by mouth 3 (three) times daily with meals.    [provider]  LACTULOSE PO Take 30 mLs by mouth See admin instructions. Take 30 ml by mouth once daily on Monday, Wednesday, and Friday Patient not taking: Reported on 03/07/2022    [provider]  lisinopril (ZESTRIL) 20 MG tablet Take 1 tablet (20 mg total) by mouth daily. 07/11/21   Mercy Riding, MD  metoprolol tartrate (LOPRESSOR) 25 MG tablet TAKE 1/2 TABLET BY MOUTH TWICE A DAY Patient not taking: Reported on 12/31/2021 10/09/20   Belva Crome, MD  nystatin ointment (MYCOSTATIN) Apply 1 application topically 2 (two) times daily. Patient taking differently: Apply 1 application  topically 2 (two) times daily. Apply to feet 06/29/21   Cardama, Grayce Sessions, MD  polyethylene glycol (MIRALAX / GLYCOLAX) 17 g packet Take 17 g by mouth every morning. Patient not taking: Reported on 12/31/2021    [provider]  selenium sulfide (SELSUN BLUE) 1 % LOTN Apply 1 application  topically 3 (three) times a week. M/W/F    [provider]  sennosides-docusate sodium (SENOKOT-S) 8.6-50 MG tablet Take 2 tablets by mouth at bedtime. Patient not taking: Reported on 12/31/2021    [provider]  silodosin (RAPAFLO) 8 MG CAPS capsule Take 8 mg by mouth every morning.    [provider]  tetrahydrozoline-zinc (VISINE-AC) 0.05-0.25 % ophthalmic solution Place 1 drop into the right eye in the morning, at noon, in the evening, and at bedtime.    [provider]      Allergies    Codeine    Review of Systems   Review of Systems  Unable to perform ROS: Dementia  Respiratory:  Positive for shortness of breath.     Physical Exam Updated Vital Signs BP 124/61   Pulse 75   Temp 97.8 F (36.6 C) (Oral)   Resp 20   SpO2 96%  Physical Exam Vitals and  nursing note reviewed.  Constitutional:      Comments: Attempting to kick and pinch staff  HENT:     Head: Atraumatic.  Eyes:     Conjunctiva/sclera: Conjunctivae normal.     Comments: Right eyelid appears swollen with some erythema and purulent discharge from eye  Cardiovascular:     Rate and Rhythm: Normal rate and regular rhythm.     Pulses: Normal pulses.     Heart sounds: No murmur heard. Pulmonary:     Effort: Pulmonary effort is normal.     Breath sounds: Normal breath sounds.     Comments: On 2 L nasal cannula Abdominal:     General: Abdomen is flat. There is no distension.     Palpations:  Abdomen is soft.     Tenderness: There is no abdominal tenderness.  Musculoskeletal:        General: Normal range of motion.     Cervical back: Normal range of motion.  Skin:    General: Skin is warm and dry.     Capillary Refill: Capillary refill takes less than 2 seconds.  Neurological:     General: No focal deficit present.     Mental Status: He is alert.  Psychiatric:        Mood and Affect: Mood normal.     ED Results / Procedures / Treatments   Labs (all labs ordered are listed, but only abnormal results are displayed) Labs Reviewed  COMPREHENSIVE METABOLIC PANEL - Abnormal; Notable for the following components:      Result Value   Potassium 2.9 (*)    Glucose, Bld 119 (*)    BUN 24 (*)    Calcium 8.5 (*)    Total Protein 6.3 (*)    Total Bilirubin 1.3 (*)    All other components within normal limits  CBC WITH DIFFERENTIAL/PLATELET - Abnormal; Notable for the following components:   RBC 3.90 (*)    Hemoglobin 12.4 (*)    HCT 36.8 (*)    Platelets 113 (*)    Lymphs Abs 0.2 (*)    All other components within normal limits  CULTURE, BLOOD (ROUTINE X 2)  CULTURE, BLOOD (ROUTINE X 2)  URINE CULTURE  RESP PANEL BY RT-PCR (FLU A&B, COVID) ARPGX2  EXPECTORATED SPUTUM ASSESSMENT W GRAM STAIN, RFLX TO RESP C  LACTIC ACID, PLASMA  PROTIME-INR  APTT  MAGNESIUM   LACTIC ACID, PLASMA  URINALYSIS, ROUTINE W REFLEX MICROSCOPIC  MAGNESIUM  PROCALCITONIN  D-DIMER, QUANTITATIVE  C-REACTIVE PROTEIN  BRAIN NATRIURETIC PEPTIDE  FERRITIN  STREP PNEUMONIAE URINARY ANTIGEN    EKG None  Radiology DG Chest Port 1 View  Result Date: 03/07/2022 CLINICAL DATA:  86 year old male with possible sepsis. Recently positive for COVID-19. EXAM: PORTABLE CHEST 1 VIEW COMPARISON:  Chest radiographs 08/07/2021 and earlier. FINDINGS: Portable AP semi upright view at 1122 hours. Lower lung volumes. Mediastinal contours remain within normal limits. Visualized tracheal air column is within normal limits. Crowding of lung markings at both medial lung bases. Confluent interstitial opacity in the right upper lobe extending from the hilum. No consolidation, pneumothorax, or pleural effusion. No acute osseous abnormality identified. Negative visible bowel gas. IMPRESSION: Low lung volumes with Right Upper Lobe Bronchopneumonia. No pleural effusion. Electronically Signed   By: Genevie Ann M.D.   On: 03/07/2022 11:39    Procedures .Critical Care  Performed by: Tonye Pearson, PA-C Authorized by: Tonye Pearson, PA-C   Critical care provider statement:    Critical care time (minutes):  30   Critical care start time:  03/07/2022 10:00 AM   Critical care end time:  03/07/2022 10:30 AM   Critical care was necessary to treat or prevent imminent or life-threatening deterioration of the following conditions:  Respiratory failure   Critical care was time spent personally by me on the following activities:  Development of treatment plan with patient or surrogate, evaluation of patient's response to treatment, examination of patient, obtaining history from patient or surrogate, re-evaluation of patient's condition, review of old charts, pulse oximetry, ordering and review of radiographic studies and ordering and review of laboratory studies   I assumed direction of critical care for this  patient from another provider in my specialty: no  Care discussed with: admitting provider       Medications Ordered in ED Medications  lactated ringers infusion (has no administration in time range)  azithromycin (ZITHROMAX) 500 mg in sodium chloride 0.9 % 250 mL IVPB (has no administration in time range)  acetaminophen (TYLENOL) tablet 650 mg (has no administration in time range)    Or  acetaminophen (TYLENOL) suppository 650 mg (has no administration in time range)  ondansetron (ZOFRAN) tablet 4 mg (has no administration in time range)    Or  ondansetron (ZOFRAN) injection 4 mg (has no administration in time range)  citalopram (CELEXA) tablet 20 mg (has no administration in time range)  aspirin EC tablet 81 mg (has no administration in time range)  finasteride (PROSCAR) tablet 5 mg (has no administration in time range)  metoprolol tartrate (LOPRESSOR) tablet 12.5 mg (has no administration in time range)  nirmatrelvir & ritonavir (PAXLOVID) 2 tablet (has no administration in time range)  rosuvastatin (CRESTOR) tablet 20 mg (has no administration in time range)  tetrahydrozoline 0.05 % ophthalmic solution 1 drop (has no administration in time range)  dexamethasone (DECADRON) injection 6 mg (has no administration in time range)  guaiFENesin-dextromethorphan (ROBITUSSIN DM) 100-10 MG/5ML syrup 10 mL (has no administration in time range)  ascorbic acid (VITAMIN C) tablet 500 mg (has no administration in time range)  zinc sulfate capsule 220 mg (has no administration in time range)  ipratropium-albuterol (DUONEB) 0.5-2.5 (3) MG/3ML nebulizer solution 3 mL (has no administration in time range)  cefTRIAXone (ROCEPHIN) 2 g in sodium chloride 0.9 % 100 mL IVPB (has no administration in time range)  azithromycin (ZITHROMAX) 500 mg in sodium chloride 0.9 % 250 mL IVPB (has no administration in time range)  haloperidol lactate (HALDOL) injection 3 mg (has no administration in time range)  0.9  % NaCl with KCl 40 mEq / L  infusion (has no administration in time range)  potassium chloride 10 mEq in 100 mL IVPB (has no administration in time range)  LORazepam (ATIVAN) injection 0.5 mg (0.5 mg Intramuscular Given 03/07/22 1032)  lactated ringers bolus 1,000 mL (1,000 mLs Intravenous New Bag/Given 03/07/22 1114)  cefTRIAXone (ROCEPHIN) 1 g in sodium chloride 0.9 % 100 mL IVPB (1 g Intravenous New Bag/Given 03/07/22 1433)    ED Course/ Medical Decision Making/ A&P                           Medical Decision Making Amount and/or Complexity of Data Reviewed Labs: ordered. Radiology: ordered. ECG/medicine tests: ordered.  Risk Prescription drug management. Decision regarding hospitalization.   Social determinants of health:  Social History   Socioeconomic History   Marital status: Married    Spouse name: Not on file   Number of children: Not on file   Years of education: Not on file   Highest education level: Not on file  Occupational History   Not on file  Tobacco Use   Smoking status: Never   Smokeless tobacco: Never  Vaping Use   Vaping Use: Never used  Substance and Sexual Activity   Alcohol use: No   Drug use: No   Sexual activity: Not on file  Other Topics Concern   Not on file  Social History Narrative   Not on file   Social Determinants of Health   Financial Resource Strain: Not on file  Food Insecurity: Not on file  Transportation Needs: Not on file  Physical Activity: Not on file  Stress: Not on file  Social Connections: Not on file  Intimate Partner Violence: Not on file     Initial impression:  This patient presents to the ED for concern of hypoxia, this involves an extensive number of treatment options, and is a complaint that carries with it a high risk of complications and morbidity.   Differentials include pneumonia, COPD, PE, pleural effusion, pneumothorax  Comorbidities affecting care:  CAD with previous MI  Additional history  obtained: [employer] and spoke to nurse, EMS  Lab Tests  I Ordered, reviewed, and interpreted labs and EKG.  The pertinent results include:  No leukocytosis Potassium 2.9  Imaging Studies ordered:  I ordered imaging studies including  Right upper lobe consolidation I independently visualized and interpreted imaging and I agree with the radiologist interpretation.    Cardiac Monitoring:  The patient was maintained on a cardiac monitor.  I personally viewed and interpreted the cardiac monitored which showed an underlying rhythm of: Sinus rhythm   Medicines ordered and prescription drug management:  I ordered medication including: Ativan 0.5 mg IM for agitation LR bolus 1 L Rocephin 1 g Reevaluation of the patient after these medicines showed that the patient improved I have reviewed the patients home medicines and have made adjustments as needed   Critical Interventions:  Respiratory failure as described above   ED Course/Re-evaluation: Patient presents agitated although nontoxic.  Vitals are without significant abnormality.  He is satting at 92% on 2 L nasal cannula upon arrival.  Due to his underlying Alzheimer's, he was quite agitated.  He was trying to kick the nursing staff and trying to pinch and spit.  Patient was given Ativan 0.5 mg IM for agitation.  Overall, physical exam was fairly unremarkable.  Lungs CTA bilaterally.  No increased respiratory effort.  His right eye has redness with purulent discharge consistent with a bacterial conjunctivitis.  He was maintained on 2 L nasal cannula to maintain oxygen saturations above 92%. Labs returned and show potassium of 2.9 so he was given 1 run of potassium 10 mEq IV.  Fortunately there is no leukocytosis, however his portable chest x-ray shows consolidation in the right upper lobe.  I talked to Dr. Olevia Bowens who agrees to admit patient for bronchopneumonia with new hypoxemia.  He recommends starting IV antibiotics.  I  ordered ceftriaxone 2 g along with the Zithromax 500 mg IV per pneumonia antibiotic recommendations.  Disposition:  After consideration of the diagnostic results, physical exam, history and the patients response to treatment feel that the patent would benefit from admission.   Community-acquired pneumonia Hypokalemia Hypoxia: Plan and management as described above.   Final Clinical Impression(s) / ED Diagnoses Final diagnoses:  Community acquired pneumonia, unspecified laterality  Hypokalemia  Hypoxia    Rx / DC Orders ED Discharge Orders     None         Tonye Pearson, PA-C 03/07/22 1509    Elgie Congo, MD 03/07/22 1706

## 2022-03-07 NOTE — H&P (Addendum)
History and Physical    Patient: Jeffrey Zimmerman EGB:[id number] DOB: 1935-12-13 DOA: 03/07/2022 DOS: the patient was seen and examined on 03/07/2022 PCP: [name], MD  Patient coming from: SNF  Chief Complaint:  Chief Complaint  Patient presents with   Shortness of Breath   Covid Positive   HPI: KEY CEN is a 86 y.o. male with medical history significant of colon polyps, osteoarthritis, IBS: Colon cancer, CAD, history of MI, history of supraventricular tachycardia, gout, cholecystectomy, hypertension, Alzheimer's dementia who was sent by SNF due to hypoxia, fever and right eye swelling.  The patient has a history of dementia, currently sedated due to lorazepam 0.5 mg IVP IM and unable to provide further information.  ED course: Initial vital signs were temperature 101.6 F, pulse 92, respiration 20, BP 157/75 mmHg O2 sat 92% on room air but decreased as low as 85% earlier.  The patient received 1000 mL of LR bolus and 0.5 mg of lorazepam IM.  Lab work: CBC showed white count of 5.5, hemoglobin 12.4 g/dL platelets 113.  PT 14.6, INR 1.2 and PTT 34.  Lactic acid was normal.  CMP showed total protein 6.3 g/dL and total bilirubin of 1.3 mg/dL, the rest of the LFTs were normal.  Potassium was 2.9 mmol/L, glucose 119, BUN 24, calcium 8.5 and total bilirubin 1.3 mg/dL.  Creatinine and the rest of the electrolytes were normal.  Imaging: One-view portable chest radiograph showed low lung volumes with right upper lobe bronchopneumonia.  There was no pleural effusion.   Review of Systems: As mentioned in the history of present illness. All other systems reviewed and are negative.  Past Medical History:  Diagnosis Date   Adenomatous colon polyp 08/2008   Arthritis    Basal cell carcinoma    Cancer (HCC)    colon   Coronary artery disease    04/21/10 Anter MI with BMS to LAD with residual proximal LAD EF 40%   Gout    History of cholecystectomy    History of colonoscopy    1/5 AND  2/10   Hypertension    Irritable bowel syndrome    Myocardial infarct [facility])    SVT (supraventricular tachycardia) ([place])    Past Surgical History:  Procedure Laterality Date   ADJACENT TISSUE TRANSFER/TISSUE REARRANGEMENT Right 06/23/2021   Procedure: ADJACENT TISSUE TRANSFER/TISSUE REARRANGEMENT;  Surgeon: Cindra Presume, MD;  Location: [place];  Service: Plastics;  Laterality: Right;   APPENDECTOMY     CARDIAC CATHETERIZATION     04/21/10 Anter MI with BMS to LAD with residual proximal LAD EF 40%   CHOLECYSTECTOMY     pt does not remember when   [place] Right 06/23/2021   Procedure: Right cheek reconstruction;  Surgeon: Cindra Presume, MD;  Location: [place];  Service: Plastics;  Laterality: Right;   KNEE ARTHOSCOPY Bilateral    PAROTIDECTOMY N/A 06/23/2021   Procedure: PAROTIDECTOMY  RIGHT SUPERFICIAL WITH FACIAL NERVE DISSECTION;  Surgeon: Izora Gala, MD;  Location: [place];  Service: ENT;  Laterality: N/A;   PARTIAL LEFT NEPHRECTOMY     (for oncocytoma-benign)   Social History:  reports that he has never smoked. He has never used smokeless tobacco. He reports that he does not drink alcohol and does not use drugs.  Allergies  Allergen Reactions   Codeine Other (See Comments)    "Doesn't help with with pain control"    Family History  Problem Relation Age of Onset   Heart  attack Mother    CAD Mother    Colon cancer Father    Rheum arthritis Maternal Grandfather     Prior to Admission medications   Medication Sig Start Date End Date Taking? Authorizing Provider  acetaminophen (TYLENOL) 650 MG CR tablet Take 650 mg by mouth every 8 (eight) hours as needed (osteoarthritis pain).   Yes [provider]  citalopram (CELEXA) 20 MG tablet Take 20 mg by mouth daily.   Yes [provider]  LORazepam (ATIVAN) 0.5 MG tablet Take 1 tablet by mouth 2 (two) times daily. 11/20/21  Yes [provider]  nirmatrelvir & ritonavir (PAXLOVID, 150/100,)  10 x 150 MG & 10 x '100MG'$  TBPK Take 2 tablets by mouth in the morning and at bedtime. For 5 days starting 03/06/22   Yes [provider]  rosuvastatin (CRESTOR) 20 MG tablet Take 1 tablet (20 mg total) by mouth daily. 12/31/21  Yes [name], MD  Tetrahydrozoline HCl (VISINE RED EYE COMFORT OP) Place 2 drops into the right eye in the morning and at bedtime.   Yes [provider]  Tiotropium Bromide Monohydrate (SPIRIVA RESPIMAT) 1.25 MCG/ACT AERS Inhale 2 each into the lungs daily.   Yes [provider]  White Petrolatum-Mineral Oil (TEARS NATURALE PM) 3-94 % OINT Place 1 Application into the right eye at bedtime.   Yes [provider]  albuterol (VENTOLIN HFA) 108 (90 Base) MCG/ACT inhaler Inhale 2 puffs into the lungs every 6 (six) hours as needed for wheezing or shortness of breath. Patient not taking: Reported on 03/07/2022    [provider]  amLODipine (NORVASC) 5 MG tablet TAKE 1 TABLET BY MOUTH DAILY Patient not taking: Reported on 12/31/2021 03/28/19   [name], MD  antiseptic oral rinse (BIOTENE) LIQD 15 mLs by Mouth Rinse route as needed for dry mouth. Patient not taking: Reported on 03/07/2022    [provider]  aspirin EC 81 MG tablet Take 1 tablet (81 mg total) by mouth daily. Swallow whole. Patient not taking: Reported on 12/31/2021 08/12/21 08/12/22  Darliss Cheney, MD  AZO CRANBERRY GUMMIES PO Take 1 tablet by mouth every morning. Patient not taking: Reported on 12/31/2021    [provider]  Cholecalciferol (VITAMIN D3) 50 MCG (2000 UT) TABS Take 2,000 Units by mouth in the morning. Patient not taking: Reported on 12/31/2021    [provider]  citalopram (CELEXA) 10 MG tablet Take 10 mg by mouth every morning. Patient not taking: Reported on 03/07/2022    [provider]  Cranberry (AZO CRANBERRY GUMMIES) 250 MG CHEW Chew 250 mg by mouth in the morning. Patient not taking: Reported on  12/31/2021    [provider]  dutasteride (AVODART) 0.5 MG capsule Take 0.5 mg by mouth every morning. Patient not taking: Reported on 12/31/2021    [provider]  finasteride (PROSCAR) 5 MG tablet Take 5 mg by mouth every morning.    [provider]  fluticasone (FLONASE) 50 MCG/ACT nasal spray Place 1 spray into both nostrils every morning.    [provider]  lactase (LACTAID) 3000 units tablet Take 3,000-9,000 Units by mouth 3 (three) times daily with meals.    [provider]  LACTULOSE PO Take 30 mLs by mouth See admin instructions. Take 30 ml by mouth once daily on Monday, Wednesday, and Friday Patient not taking: Reported on 03/07/2022    [provider]  lisinopril (ZESTRIL) 20 MG tablet Take 1 tablet (20  mg total) by mouth daily. 07/11/21   Mercy Riding, MD  metoprolol tartrate (LOPRESSOR) 25 MG tablet TAKE 1/2 TABLET BY MOUTH TWICE A DAY Patient not taking: Reported on 12/31/2021 10/09/20   Belva Crome, MD  nystatin ointment (MYCOSTATIN) Apply 1 application topically 2 (two) times daily. Patient taking differently: Apply 1 application  topically 2 (two) times daily. Apply to feet 06/29/21   Cardama, Grayce Sessions, MD  polyethylene glycol (MIRALAX / GLYCOLAX) 17 g packet Take 17 g by mouth every morning. Patient not taking: Reported on 12/31/2021    [provider]  selenium sulfide (SELSUN BLUE) 1 % LOTN Apply 1 application  topically 3 (three) times a week. M/W/F    [provider]  sennosides-docusate sodium (SENOKOT-S) 8.6-50 MG tablet Take 2 tablets by mouth at bedtime. Patient not taking: Reported on 12/31/2021    [provider]  silodosin (RAPAFLO) 8 MG CAPS capsule Take 8 mg by mouth every morning.    [provider]  tetrahydrozoline-zinc (VISINE-AC) 0.05-0.25 % ophthalmic solution Place 1 drop into the right eye in the morning, at noon, in the evening, and at bedtime.    [provider]    Physical Exam: Vitals:   03/07/22 1147 03/07/22 1200 03/07/22 1230 03/07/22 1438  BP: 122/60 (!) 121/59 112/61   Pulse: 64 64 64 75  Resp: (!) 30 (!) 36 (!) 31 20  Temp:    97.8 F (36.6 C)  TempSrc:    Oral  SpO2: 98% 97% 97% 96%   Physical Exam Vitals and nursing note reviewed.  Constitutional:      General: He is sleeping. He is not in acute distress.    Appearance: He is well-developed. He is not ill-appearing.  HENT:     Head: Normocephalic.     Nose: No rhinorrhea.     Mouth/Throat:     Mouth: Mucous membranes are moist.  Eyes:     General: No scleral icterus.    Pupils: Pupils are equal, round, and reactive to light.     Comments: R periorbital edema  Neck:     Vascular: No JVD.  Cardiovascular:     Rate and Rhythm: Normal rate and regular rhythm.  Pulmonary:     Effort: Pulmonary effort is normal.     Breath sounds: Examination of the right-upper field reveals rhonchi. Examination of the right-lower field reveals rhonchi. Examination of the left-lower field reveals rhonchi. Decreased breath sounds and rhonchi present. No wheezing.  Abdominal:     General: Bowel sounds are normal. There is no distension.     Palpations: Abdomen is soft.     Tenderness: There is no abdominal tenderness. There is no guarding.  Musculoskeletal:     Cervical back: Neck supple.     Right lower leg: No edema.     Left lower leg: No edema.  Skin:    General: Skin is warm and dry.  Neurological:     General: No focal deficit present.     Mental Status: Mental status is at baseline.  Psychiatric:        Mood and Affect: Mood normal.     Data Reviewed:  There are no new results to review at this time.  Assessment and Plan: Principal Problem:   Acute respiratory failure with hypoxia ([place]) Secondary to   Pneumonia due to COVID-19 virus Admit to telemetry/inpatient. Supplemental oxygen as needed. Bronchodilators 4 times a day/PRN. Incentive spirometry while  awake. Flutter valve  exercises. Antitussives as needed. Vitamin C/zinc supplementation Dexamethasone 6 mg IVP daily. Paxlovid p.o. twice daily. Follow-up CBC, CMP and chemistry. Continue ceftriaxone/azithromycin coverage. Check procalcitonin level.  Active Problems:   Hypertension Continue metoprolol 12.5 mg p.o. twice daily. Monitor BP and heart rate.    Coronary artery disease Continue aspirin, beta-blocker. Call statin while on antiviral.    SVT (supraventricular tachycardia) ([place]) : Continue metoprolol 12.5 mg p.o. twice daily.    Mixed hyperlipidemia Hold rosuvastatin 20 mg p.o. daily. Resume once Paxlovid therapy finish.    Alzheimer's dementia with behavioral disturbance (HCC) Continue citalopram 20 mg p.o. daily. As needed low-dose haloperidol.    BPH (benign prostatic hyperplasia) Continue Rapaflo 80 mg p.o. daily. Continue finasteride 5 mg p.o. daily.      Advance Care Planning:   Code Status: Full Code   Consults:   Family Communication:   Severity of Illness: The appropriate patient status for this patient is INPATIENT. Inpatient status is judged to be reasonable and necessary in order to provide the required intensity of service to ensure the patient's safety. The patient's presenting symptoms, physical exam findings, and initial radiographic and laboratory data in the context of their chronic comorbidities is felt to place them at high risk for further clinical deterioration. Furthermore, it is not anticipated that the patient will be medically stable for discharge from the hospital within 2 midnights of admission.   * I certify that at the point of admission it is my clinical judgment that the patient will require inpatient hospital care spanning beyond 2 midnights from the point of admission due to high intensity of service, high risk for further deterioration and high frequency of surveillance required.*  Author: Reubin Milan, MD 03/07/2022 2:46  PM  For on call review [email or web address].   This document was prepared using Dragon voice recognition software and may contain some unintended transcription errors.
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
## 2022-03-08 DIAGNOSIS — J9601 Acute respiratory failure with hypoxia: Secondary | ICD-10-CM | POA: Diagnosis not present

## 2022-03-08 MED ORDER — SODIUM CHLORIDE 0.9 % IV SOLN
200.0000 mg | Freq: Once | INTRAVENOUS | Status: AC
  Administered 2022-03-08: 200 mg via INTRAVENOUS
  Filled 2022-03-08: qty 40

## 2022-03-08 MED ORDER — IPRATROPIUM-ALBUTEROL 20-100 MCG/ACT IN AERS
1.0000 | INHALATION_SPRAY | Freq: Two times a day (BID) | RESPIRATORY_TRACT | Status: DC
Start: 2022-03-08 — End: 2022-03-10
  Administered 2022-03-08 – 2022-03-10 (×4): 1 via RESPIRATORY_TRACT

## 2022-03-08 MED ORDER — HYDRALAZINE HCL 20 MG/ML IJ SOLN
10.0000 mg | Freq: Four times a day (QID) | INTRAMUSCULAR | Status: DC | PRN
  Administered 2022-03-09 – 2022-03-13 (×3): 10 mg via INTRAVENOUS
  Filled 2022-03-08 (×3): qty 1

## 2022-03-08 MED ORDER — SODIUM CHLORIDE 0.9 % IV SOLN
100.0000 mg | Freq: Every day | INTRAVENOUS | Status: AC
  Administered 2022-03-09 – 2022-03-10 (×2): 100 mg via INTRAVENOUS
  Filled 2022-03-08 (×3): qty 20

## 2022-03-08 NOTE — Progress Notes (Signed)
Tele called stated pt HR drop in the low 40s. Pt is asleep. Pt HR went up to 60s when I woke him up. It did not sustain. Notified provider.
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
## 2022-03-08 NOTE — Progress Notes (Signed)
PROGRESS NOTE    BENEDICTO CAPOZZI  EXH:[id number] DOB: Jul 22, 1935 DOA: 03/07/2022 PCP: [name], MD    Brief Narrative:   Jeffrey Zimmerman is a 86 y.o. male with past medical history significant for advanced Alzheimer's dementia, CAD, history of MI, history of SVT, IBS, history of colon cancer, essential hypertension who presented to [facility] ED on 8/19 via EMS from SNF due to hypoxia, fever.  No family was present at time of ED evaluation and patient with advanced dementia and received IV lorazepam and unable to provide further information.  In the ED, temperature 101.6 F, HR 92, RR 20, BP 157/75, SPO2 85% on room air.  WBC 5.5, hemoglobin 12.4, platelets 113.  INR 1.2.  Sodium 142, potassium 2.9, chloride 103, CO2 29, glucose 119, BUN 24, creatinine 0.74.  Total bilirubin 1.3, AST 35, ALT 27.  BNP 103.2.  Lactic acid 0.9.  Influenza A/B PCR negative.  COVID-19 PCR positive.  Chest x-ray with right upper lobe bronchopneumonia, no pleural effusion.  Blood cultures x2 drawn.  EDP consulted [facility] for admission for acute hypoxic respiratory failure secondary to COVID-19 viral infection complicated superimposed pneumonia  Assessment & Plan:   Acute hypoxic respiratory failure secondary to acute Covid-19 viral pneumonia,POA Patient presenting from SNF after being found with fever, hypoxia.  SPO2 85% on room air.  Temperature 101.6 F.  COVID-19 positive.  Chest x-ray also with concerning for pneumonia. --COVID test: + 8/19 --CRP 13.8 --ddimer 1.09 --Remdesivir, plan 5-day course (Day #1/5); currently not tolerating oral medications --Continue Decadron 6 mg IV every 24 hours --prone for 2-3hrs every 12hrs if able --Continue supplemental oxygen, titrate to maintain SPO2 > 92% --Continue supportive care with albuterol MDI prn, vitamin C, zinc, Tylenol, antitussives  --Follow CBC, CMP, D-dimer, ferritin, and CRP daily --Continue airborne/contact isolation precautions for 10 days from initial diagnosis  of 8/19; and will require this quarantine period before able to discharge back to SNF  Community acquired pneumonia Patient presenting with shortness of breath found to be hypoxic and febrile.  Chest x-ray with right upper lobe bronchopneumonia. --Blood cultures x2: Pending --Azithromycin --Ceftriaxone --Continue supplemental oxygen, maintain SPO2 > 92%, currently on 3 L [place]  Hypokalemia Potassium 2.9 on admission, repleted. --Follow electrolytes daily  Hx SVT Sinus bradycardia Essential hypertension Patient with history of SVT, metoprolol tartrate 12.5 mg p.o. twice daily outpatient.  On telemetry, patient continues with slight bradycardia.  Also not currently tolerating any oral medications. --Discontinue home metoprolol tartrate --Hydralazine 10 mg IV q6h PRN SBP >165 or DBP >110 --Monitor on telemetry; monitor blood pressure  Depression/anxiety: --Celexa 20 mg p.o. daily if able to tolerate  BPH: Finasteride 5 mg p.o. daily if can tolerate  Advance Alzheimer's dementia --Delirium precautions --Get up during the day --Encourage a familiar face to remain present throughout the day --Keep blinds open and lights on during daylight hours --Minimize the use of opioids/benzodiazepines --Haldol as needed for agitation    DVT prophylaxis: SCDs Start: 03/07/22 1425    Code Status: Full Code Family Communication: No family present at bedside this morning; updated patient's son Dylan via telephone this afternoon  Disposition Plan:  Level of care: Progressive Status is: Inpatient Remains inpatient appropriate because: Continues with poor oral intake, IV antibiotics, IV remdesivir, will likely need 10-day isolation.  Before able to return to SNF; overall very poor prognosis given his advanced age, comorbidities coupled with COVID-pneumonia/community-acquired pneumonia    Consultants:  None  Procedures:  None  Antimicrobials:  Azithromycin 8/19 Ceftriaxone 8/19 Remdesivir  8/20>>   Subjective: Patient seen examined at bedside, resting lying in bed, arousable but remains pleasantly confused.  Per RN not tolerating any oral intake to include his oral medications and requesting change to IV formulations.  No family present at bedside.  Remains on supplemental oxygen.  Unable to obtain any further ROS from patient this morning.  No other acute concerns overnight per nursing staff.  Objective: Vitals:   03/07/22 1940 03/07/22 2043 03/08/22 0049 03/08/22 0412  BP:  (!) 147/66 (!) 153/68 (!) 151/67  Pulse:  60 (!) 54 (!) 47  Resp:  '19 20 19  '$ Temp:  98.4 F (36.9 C) 98 F (36.7 C) 97.8 F (36.6 C)  TempSrc:  Oral Axillary Axillary  SpO2:  97% 100% 100%  Weight: 74.5 kg     Height: '5\' 11"'$  (1.803 m)       Intake/Output Summary (Last 24 hours) at 03/08/2022 1244 Last data filed at 03/08/2022 1204 Gross per 24 hour  Intake 2017.17 ml  Output 400 ml  Net 1617.17 ml   Filed Weights   03/07/22 1940  Weight: 74.5 kg    Examination:  Physical Exam: GEN: NAD, alert, pleasantly confused, mumbling speech/incoherent; chronically ill in appearance HEENT: NCAT, PERRL, EOMI, sclera clear, dry mucous membranes PULM: Diminished breath sounds bilateral bases, no wheezes/crackles, normal respiratory effort without accessory muscle use, on 3 L nasal cannula with SPO2 100% at rest CV: Bradycardic, regular rhythm w/o M/G/R GI: abd soft, NTND, NABS, no R/G/M MSK: no peripheral edema, moves all extremities independently   Data Reviewed: I have personally reviewed following labs and imaging studies  CBC: Recent Labs  Lab 03/07/22 1110  WBC 5.5  NEUTROABS 4.8  HGB 12.4*  HCT 36.8*  MCV 94.4  PLT 502*   Basic Metabolic Panel: Recent Labs  Lab 03/07/22 1110  NA 142  K 2.9*  CL 103  CO2 29  GLUCOSE 119*  BUN 24*  CREATININE 0.74  CALCIUM 8.5*  MG 2.4   GFR: Estimated Creatinine Clearance: 72.4 mL/min (by C-G formula based on SCr of 0.74 mg/dL). Liver  Function Tests: Recent Labs  Lab 03/07/22 1110  AST 35  ALT 27  ALKPHOS 52  BILITOT 1.3*  PROT 6.3*  ALBUMIN 3.7   No results for input(s): "LIPASE", "AMYLASE" in the last 168 hours. No results for input(s): "AMMONIA" in the last 168 hours. Coagulation Profile: Recent Labs  Lab 03/07/22 1110  INR 1.2   Cardiac Enzymes: No results for input(s): "CKTOTAL", "CKMB", "CKMBINDEX", "TROPONINI" in the last 168 hours. BNP (last 3 results) No results for input(s): "PROBNP" in the last 8760 hours. HbA1C: No results for input(s): "HGBA1C" in the last 72 hours. CBG: No results for input(s): "GLUCAP" in the last 168 hours. Lipid Profile: No results for input(s): "CHOL", "HDL", "LDLCALC", "TRIG", "CHOLHDL", "LDLDIRECT" in the last 72 hours. Thyroid Function Tests: No results for input(s): "TSH", "T4TOTAL", "FREET4", "T3FREE", "THYROIDAB" in the last 72 hours. Anemia Panel: Recent Labs    03/07/22 1110  FERRITIN 262   Sepsis Labs: Recent Labs  Lab 03/07/22 1110 03/07/22 1434  PROCALCITON <0.10  --   LATICACIDVEN 0.9 0.7    Recent Results (from the past 240 hour(s))  Blood Culture (routine x 2)     Status: None (Preliminary result)   Collection Time: 03/07/22 11:10 AM   Specimen: BLOOD  Result Value Ref Range Status   Specimen Description   Final    BLOOD  SITE NOT SPECIFIED Performed at [facility] [address] [place], [place] [address]    Special Requests   Final    BOTTLES DRAWN AEROBIC AND ANAEROBIC Blood Culture adequate volume Performed at [facility] [address], [place], [place] [address]    Culture   Final    NO GROWTH < 12 HOURS Performed at [facility] [address] [place], [place] [address]    Report Status PENDING  Incomplete  Resp Panel by RT-PCR (Flu A&B, Covid) Anterior Nasal Swab     Status: Abnormal   Collection Time: 03/07/22 11:30 AM   Specimen: Anterior Nasal Swab  Result Value Ref Range  Status   SARS Coronavirus 2 by RT PCR POSITIVE (A) NEGATIVE Final    Comment: CRITICAL RESULT CALLED TO, READ BACK BY AND VERIFIED WITH: PROGO,A AT 1613 ON 03/07/22 BY LUZOLOP (NOTE) SARS-CoV-2 target nucleic acids are DETECTED.  The SARS-CoV-2 RNA is generally detectable in upper respiratory specimens during the acute phase of infection. Positive results are indicative of the presence of the identified virus, but do not rule out bacterial infection or co-infection with other pathogens not detected by the test. Clinical correlation with patient history and other diagnostic information is necessary to determine patient infection status. The expected result is Negative.  Fact Sheet for Patients: EntrepreneurPulse.com.au  Fact Sheet for Healthcare Providers: IncredibleEmployment.be  This test is not yet approved or cleared by the [place] FDA and  has been authorized for detection and/or diagnosis of SARS-CoV-2 by FDA under an Emergency Use Authorization (EUA).  This EUA will remain in effect (meaning thi s test can be used) for the duration of  the COVID-19 declaration under Section 564(b)(1) of the Act, 21 U.S.C. section 360bbb-3(b)(1), unless the authorization is terminated or revoked sooner.     Influenza A by PCR NEGATIVE NEGATIVE Final   Influenza B by PCR NEGATIVE NEGATIVE Final    Comment: (NOTE) The Xpert Xpress SARS-CoV-2/FLU/RSV plus assay is intended as an aid in the diagnosis of influenza from Nasopharyngeal swab specimens and should not be used as a sole basis for treatment. Nasal washings and aspirates are unacceptable for Xpert Xpress SARS-CoV-2/FLU/RSV testing.  Fact Sheet for Patients: EntrepreneurPulse.com.au  Fact Sheet for Healthcare Providers: IncredibleEmployment.be  This test is not yet approved or cleared by the [place] FDA and has been authorized for detection and/or  diagnosis of SARS-CoV-2 by FDA under an Emergency Use Authorization (EUA). This EUA will remain in effect (meaning this test can be used) for the duration of the COVID-19 declaration under Section 564(b)(1) of the Act, 21 U.S.C. section 360bbb-3(b)(1), unless the authorization is terminated or revoked.  Performed at [facility], [place] [address], [place], [place] [address]          Radiology Studies: [facility] Chest [place] 1 View  Result Date: 03/07/2022 CLINICAL DATA:  86 year old male with possible sepsis. Recently positive for COVID-19. EXAM: PORTABLE CHEST 1 VIEW COMPARISON:  Chest radiographs 08/07/2021 and earlier. FINDINGS: Portable AP semi upright view at 1122 hours. Lower lung volumes. Mediastinal contours remain within normal limits. Visualized tracheal air column is within normal limits. Crowding of lung markings at both medial lung bases. Confluent interstitial opacity in the right upper lobe extending from the hilum. No consolidation, pneumothorax, or pleural effusion. No acute osseous abnormality identified. Negative visible bowel gas. IMPRESSION: Low lung volumes with Right Upper Lobe Bronchopneumonia. No pleural effusion. Electronically Signed   By:  Genevie Ann M.D.   On: 03/07/2022 11:39        Scheduled Meds:  ascorbic acid  500 mg Oral Daily   aspirin EC  81 mg Oral Daily   citalopram  20 mg Oral Daily   dexamethasone (DECADRON) injection  6 mg Intravenous Q24H   finasteride  5 mg Oral q morning   Ipratropium-Albuterol  1 puff Inhalation Q6H   zinc sulfate  220 mg Oral Daily   Continuous Infusions:  azithromycin     cefTRIAXone (ROCEPHIN)  IV     remdesivir 200 mg in sodium chloride 0.9% 250 mL IVPB     Followed by   Derrill Memo ON 03/09/2022] remdesivir 100 mg in sodium chloride 0.9 % 100 mL IVPB       LOS: 1 day    Time spent: 51 minutes spent on chart review, discussion with nursing staff, consultants, updating family and interview/physical exam; more  than 50% of that time was spent in counseling and/or coordination of care.    Halima Fogal J [place] DO [facility] Available via Epic secure chat 7am-7pm After these hours, please refer to coverage provider listed on [email or web address] 03/08/2022, 12:44 PM

## 2022-03-08 NOTE — Progress Notes (Signed)
Patient unable to take medications this morning. Patient clenches teeth closed. Attempted with applesauce, patient states "he doesn't like that". Patient was able to take few sips of water at time. Will continue to monitor patient.

## 2022-03-09 DIAGNOSIS — J9601 Acute respiratory failure with hypoxia: Secondary | ICD-10-CM | POA: Diagnosis not present

## 2022-03-09 LAB — CBC WITH DIFFERENTIAL/PLATELET
Abs Immature Granulocytes: 0.02 10*3/uL (ref 0.00–0.07)
Basophils Absolute: 0 10*3/uL (ref 0.0–0.1)
Basophils Relative: 0 %
Eosinophils Absolute: 0 10*3/uL (ref 0.0–0.5)
Eosinophils Relative: 0 %
HCT: 40.3 % (ref 39.0–52.0)
Hemoglobin: 13.1 g/dL (ref 13.0–17.0)
Immature Granulocytes: 0 %
Lymphocytes Relative: 5 %
Lymphs Abs: 0.4 10*3/uL — ABNORMAL LOW (ref 0.7–4.0)
MCH: 30.8 pg (ref 26.0–34.0)
MCHC: 32.5 g/dL (ref 30.0–36.0)
MCV: 94.8 fL (ref 80.0–100.0)
Monocytes Absolute: 0.2 10*3/uL (ref 0.1–1.0)
Monocytes Relative: 3 %
Neutro Abs: 6 10*3/uL (ref 1.7–7.7)
Neutrophils Relative %: 92 %
Platelets: 127 10*3/uL — ABNORMAL LOW (ref 150–400)
RBC: 4.25 MIL/uL (ref 4.22–5.81)
RDW: 13.3 % (ref 11.5–15.5)
WBC: 6.6 10*3/uL (ref 4.0–10.5)
nRBC: 0 % (ref 0.0–0.2)

## 2022-03-09 LAB — COMPREHENSIVE METABOLIC PANEL
ALT: 42 U/L (ref 0–44)
AST: 54 U/L — ABNORMAL HIGH (ref 15–41)
Albumin: 3 g/dL — ABNORMAL LOW (ref 3.5–5.0)
Alkaline Phosphatase: 52 U/L (ref 38–126)
Anion gap: 8 (ref 5–15)
BUN: 23 mg/dL (ref 8–23)
CO2: 28 mmol/L (ref 22–32)
Calcium: 8.3 mg/dL — ABNORMAL LOW (ref 8.9–10.3)
Chloride: 109 mmol/L (ref 98–111)
Creatinine, Ser: 0.7 mg/dL (ref 0.61–1.24)
GFR, Estimated: >60 mL/min (ref >60)
Glucose, Bld: 144 mg/dL — ABNORMAL HIGH (ref 70–99)
Potassium: 3.7 mmol/L (ref 3.5–5.1)
Sodium: 145 mmol/L (ref 135–145)
Total Bilirubin: 0.8 mg/dL (ref 0.3–1.2)
Total Protein: 5.6 g/dL — ABNORMAL LOW (ref 6.5–8.1)

## 2022-03-09 LAB — FERRITIN: Ferritin: 332 ng/mL (ref 24–336)

## 2022-03-09 LAB — MAGNESIUM: Magnesium: 2 mg/dL (ref 1.7–2.4)

## 2022-03-09 LAB — C-REACTIVE PROTEIN: CRP: 10.4 mg/dL — ABNORMAL HIGH (ref <1.0)

## 2022-03-09 LAB — PHOSPHORUS: Phosphorus: 3 mg/dL (ref 2.5–4.6)

## 2022-03-09 LAB — PROCALCITONIN: Procalcitonin: <0.1 ng/mL

## 2022-03-09 LAB — D-DIMER, QUANTITATIVE: D-Dimer, Quant: 1.17 ug/mL-FEU — ABNORMAL HIGH (ref 0.00–0.50)

## 2022-03-09 MED ORDER — ADULT MULTIVITAMIN W/MINERALS CH
1.0000 | ORAL_TABLET | Freq: Every day | ORAL | Status: DC
Start: 2022-03-10 — End: 2022-03-13
  Administered 2022-03-10 – 2022-03-13 (×4): 1 via ORAL
  Filled 2022-03-09 (×4): qty 1

## 2022-03-09 MED ORDER — ENSURE ENLIVE PO LIQD
237.0000 mL | Freq: Three times a day (TID) | ORAL | Status: DC
  Administered 2022-03-09 – 2022-03-11 (×5): 237 mL via ORAL

## 2022-03-09 NOTE — TOC Progression Note (Addendum)
Transition of Care [facility]) - Progression Note    Patient Details  Name: Jeffrey Zimmerman MRN: [id number] Date of Birth: 01-Mar-1936  Transition of Care [facility]) CM/SW Contact  Leeroy Cha, RN Phone Number: 03/09/2022, 11:11 AM  Clinical Narrative:    Pt is from [facility] [place] not [facility], does not need the ten day isolation stay return.  Patient was there before going to [facility]/ was covid positive then.   Expected Discharge Plan: [place] [facility] [place]) Barriers to Discharge: Continued Medical Work up  Expected Discharge Plan and Services Expected Discharge Plan: [place] [facility] [place])   Discharge Planning Services: CM Consult Post Acute Care Choice: [place] Living arrangements for the past 2 months: [place]                                       Social Determinants of Health (SDOH) Interventions    Readmission Risk Interventions    08/04/2021    3:55 PM  Readmission Risk Prevention Plan  Transportation Screening Complete  Medication Review (RN Care Manager) Complete  PCP or Specialist appointment within 3-5 days of discharge Not Complete  PCP/Specialist Appt Not Complete comments [place] not medically ready for discharge.  [place] or Home Care Consult Complete  SW Recovery Care/Counseling Consult Complete  Palliative Care Screening Not Applicable  Skilled Nursing Facility Not Applicable

## 2022-03-09 NOTE — Progress Notes (Signed)
PROGRESS NOTE    Jeffrey Zimmerman  ZOX:[id number] DOB: July 05, 1936 DOA: 03/07/2022 PCP: [name], MD    Brief Narrative:   Jeffrey Zimmerman is a 86 y.o. male with past medical history significant for advanced Alzheimer's dementia, CAD, history of MI, history of SVT, IBS, history of colon cancer, essential hypertension who presented to [facility] ED on 8/19 via EMS from SNF due to hypoxia, fever.  No family was present at time of ED evaluation and patient with advanced dementia and received IV lorazepam and unable to provide further information.  In the ED, temperature 101.6 F, HR 92, RR 20, BP 157/75, SPO2 85% on room air.  WBC 5.5, hemoglobin 12.4, platelets 113.  INR 1.2.  Sodium 142, potassium 2.9, chloride 103, CO2 29, glucose 119, BUN 24, creatinine 0.74.  Total bilirubin 1.3, AST 35, ALT 27.  BNP 103.2.  Lactic acid 0.9.  Influenza A/B PCR negative.  COVID-19 PCR positive.  Chest x-ray with right upper lobe bronchopneumonia, no pleural effusion.  Blood cultures x2 drawn.  EDP consulted [facility] for admission for acute hypoxic respiratory failure secondary to COVID-19 viral infection complicated superimposed pneumonia  Assessment & Plan:   Acute hypoxic respiratory failure secondary to acute Covid-19 viral pneumonia,POA Patient presenting from SNF after being found with fever, hypoxia.  SPO2 85% on room air.  Temperature 101.6 F.  COVID-19 positive.  Chest x-ray also with concerning for pneumonia, which is likely viral in nature given patient is without leukocytosis and procalcitonin negative x2. --COVID test: + 8/19 --CRP 13.8>10.4 --ddimer 1.09>1.17 --Remdesivir, plan 5-day course (Day #2/5) --Continue Decadron 6 mg IV every 24 hours --prone for 2-3hrs every 12hrs if able --Continue supplemental oxygen, titrate to maintain SPO2 > 92%; currently on 3L [place] w/ SPO2 100% --Continue supportive care with albuterol MDI prn, vitamin C, zinc, Tylenol, antitussives  --Follow CBC, CMP, D-dimer, and CRP  daily --Continue airborne/contact isolation precautions for 10 days from initial diagnosis of 8/19; and will require this quarantine period before able to discharge back to SNF  Hypokalemia Repleted.  Potassium 3.7 this morning.  Hx SVT Sinus bradycardia Essential hypertension Patient with history of SVT, metoprolol tartrate 12.5 mg p.o. twice daily outpatient.  On telemetry, patient continues with slight bradycardia.  Also not currently tolerating any oral medications. --Discontinue home metoprolol tartrate --Hydralazine 10 mg IV q6h PRN SBP >165 or DBP >110 --Monitor on telemetry; monitor blood pressure  Depression/anxiety: --Celexa 20 mg p.o. daily if able to tolerate  BPH: Finasteride 5 mg p.o. daily if can tolerate  Advance Alzheimer's dementia --Delirium precautions --Get up during the day --Encourage a familiar face to remain present throughout the day --Keep blinds open and lights on during daylight hours --Minimize the use of opioids/benzodiazepines --Haldol as needed for agitation    DVT prophylaxis: SCDs Start: 03/07/22 1425    Code Status: Full Code Family Communication: No family present at bedside this morning; updated patient's son Dylan telephone yesterday afternoon  Disposition Plan:  Level of care: Progressive Status is: Inpatient Remains inpatient appropriate because: Continues with poor oral intake, IV remdesivir, will need 10-day isolation period before able to return to SNF; overall very poor prognosis given his advanced age, comorbidities coupled with COVID-pneumonia    Consultants:  None  Procedures:  None  Antimicrobials:  Azithromycin 8/19 - 8/21 Ceftriaxone 8/19 - 8/21 Remdesivir 8/20>>   Subjective: Patient seen examined at bedside, resting lying in bed, more alert this morning but pleasantly confused.  Has mitts  in place due to attempts to remove medical equipment/IV.  Remains on supplemental oxygen but SPO2 100% at rest on 3 L nasal  cannula. Unable to obtain any further ROS from patient this morning given his underlying dementia.  No acute concerns overnight per nursing staff.  Objective: Vitals:   03/08/22 2001 03/09/22 0400 03/09/22 0830 03/09/22 1219  BP:  (!) 179/77 (!) 181/89 (!) 173/67  Pulse:  64 (!) 45 (!) 51  Resp:  19 16 (!) 21  Temp: 98.4 F (36.9 C) 98.5 F (36.9 C) 98.4 F (36.9 C) 98.4 F (36.9 C)  TempSrc:  Oral Oral   SpO2: 100% 100% 96% 99%  Weight:      Height:        Intake/Output Summary (Last 24 hours) at 03/09/2022 1245 Last data filed at 03/09/2022 0240 Gross per 24 hour  Intake 885.28 ml  Output 700 ml  Net 185.28 ml   Filed Weights   03/07/22 1940  Weight: 74.5 kg    Examination:  Physical Exam: GEN: NAD, alert, pleasantly confused, chronically ill in appearance HEENT: NCAT, PERRL, EOMI, sclera clear, dry mucous membranes PULM: Diminished breath sounds bilateral bases, no wheezes/crackles, normal respiratory effort without accessory muscle use, on 3 L nasal cannula with SPO2 100% at rest CV: Bradycardic, regular rhythm w/o M/G/R GI: abd soft, NTND, NABS, no R/G/M MSK: no peripheral edema, moves all extremities independently   Data Reviewed: I have personally reviewed following labs and imaging studies  CBC: Recent Labs  Lab 03/07/22 1110 03/09/22 0458  WBC 5.5 6.6  NEUTROABS 4.8 6.0  HGB 12.4* 13.1  HCT 36.8* 40.3  MCV 94.4 94.8  PLT 113* 973*   Basic Metabolic Panel: Recent Labs  Lab 03/07/22 1110 03/09/22 0458  NA 142 145  K 2.9* 3.7  CL 103 109  CO2 29 28  GLUCOSE 119* 144*  BUN 24* 23  CREATININE 0.74 0.70  CALCIUM 8.5* 8.3*  MG 2.4 2.0  PHOS  --  3.0   GFR: Estimated Creatinine Clearance: 72.4 mL/min (by C-G formula based on SCr of 0.7 mg/dL). Liver Function Tests: Recent Labs  Lab 03/07/22 1110 03/09/22 0458  AST 35 54*  ALT 27 42  ALKPHOS 52 52  BILITOT 1.3* 0.8  PROT 6.3* 5.6*  ALBUMIN 3.7 3.0*   No results for input(s):  "LIPASE", "AMYLASE" in the last 168 hours. No results for input(s): "AMMONIA" in the last 168 hours. Coagulation Profile: Recent Labs  Lab 03/07/22 1110  INR 1.2   Cardiac Enzymes: No results for input(s): "CKTOTAL", "CKMB", "CKMBINDEX", "TROPONINI" in the last 168 hours. BNP (last 3 results) No results for input(s): "PROBNP" in the last 8760 hours. HbA1C: No results for input(s): "HGBA1C" in the last 72 hours. CBG: No results for input(s): "GLUCAP" in the last 168 hours. Lipid Profile: No results for input(s): "CHOL", "HDL", "LDLCALC", "TRIG", "CHOLHDL", "LDLDIRECT" in the last 72 hours. Thyroid Function Tests: No results for input(s): "TSH", "T4TOTAL", "FREET4", "T3FREE", "THYROIDAB" in the last 72 hours. Anemia Panel: Recent Labs    03/07/22 1110 03/09/22 0458  FERRITIN 262 332   Sepsis Labs: Recent Labs  Lab 03/07/22 1110 03/07/22 1434 03/09/22 0458  PROCALCITON <0.10  --  <0.10  LATICACIDVEN 0.9 0.7  --     Recent Results (from the past 240 hour(s))  Blood Culture (routine x 2)     Status: None (Preliminary result)   Collection Time: 03/07/22 11:10 AM   Specimen: BLOOD  Result Value Ref  Range Status   Specimen Description   Final    BLOOD SITE NOT SPECIFIED Performed at [facility] [address] [place], [place] [address]    Special Requests   Final    BOTTLES DRAWN AEROBIC AND ANAEROBIC Blood Culture adequate volume Performed at [facility] [address] [place], [place] [address]    Culture   Final    NO GROWTH 2 DAYS Performed at [facility] [address] [place], [place] [address]    Report Status PENDING  Incomplete  Resp Panel by RT-PCR (Flu A&B, Covid) Anterior Nasal Swab     Status: Abnormal   Collection Time: 03/07/22 11:30 AM   Specimen: Anterior Nasal Swab  Result Value Ref Range Status   SARS Coronavirus 2 by RT PCR POSITIVE (A) NEGATIVE Final    Comment: CRITICAL RESULT CALLED TO,  READ BACK BY AND VERIFIED WITH: PROGO,A AT 1613 ON 03/07/22 BY LUZOLOP (NOTE) SARS-CoV-2 target nucleic acids are DETECTED.  The SARS-CoV-2 RNA is generally detectable in upper respiratory specimens during the acute phase of infection. Positive results are indicative of the presence of the identified virus, but do not rule out bacterial infection or co-infection with other pathogens not detected by the test. Clinical correlation with patient history and other diagnostic information is necessary to determine patient infection status. The expected result is Negative.  Fact Sheet for Patients: EntrepreneurPulse.com.au  Fact Sheet for Healthcare Providers: IncredibleEmployment.be  This test is not yet approved or cleared by the [place] FDA and  has been authorized for detection and/or diagnosis of SARS-CoV-2 by FDA under an Emergency Use Authorization (EUA).  This EUA will remain in effect (meaning thi s test can be used) for the duration of  the COVID-19 declaration under Section 564(b)(1) of the Act, 21 U.S.C. section 360bbb-3(b)(1), unless the authorization is terminated or revoked sooner.     Influenza A by PCR NEGATIVE NEGATIVE Final   Influenza B by PCR NEGATIVE NEGATIVE Final    Comment: (NOTE) The Xpert Xpress SARS-CoV-2/FLU/RSV plus assay is intended as an aid in the diagnosis of influenza from Nasopharyngeal swab specimens and should not be used as a sole basis for treatment. Nasal washings and aspirates are unacceptable for Xpert Xpress SARS-CoV-2/FLU/RSV testing.  Fact Sheet for Patients: EntrepreneurPulse.com.au  Fact Sheet for Healthcare Providers: IncredibleEmployment.be  This test is not yet approved or cleared by the [place] FDA and has been authorized for detection and/or diagnosis of SARS-CoV-2 by FDA under an Emergency Use Authorization (EUA). This EUA will remain in effect  (meaning this test can be used) for the duration of the COVID-19 declaration under Section 564(b)(1) of the Act, 21 U.S.C. section 360bbb-3(b)(1), unless the authorization is terminated or revoked.  Performed at [facility], [place] [address], [place], [place] [address]          Radiology Studies: No results found.      Scheduled Meds:  ascorbic acid  500 mg Oral Daily   aspirin EC  81 mg Oral Daily   citalopram  20 mg Oral Daily   dexamethasone (DECADRON) injection  6 mg Intravenous Q24H   finasteride  5 mg Oral q morning   Ipratropium-Albuterol  1 puff Inhalation BID   zinc sulfate  220 mg Oral Daily   Continuous Infusions:  remdesivir 100 mg in sodium chloride 0.9 % 100 mL IVPB       LOS: 2 days    Time  spent: 51 minutes spent on chart review, discussion with nursing staff, consultants, updating family and interview/physical exam; more than 50% of that time was spent in counseling and/or coordination of care.    Akina Maish J [place] DO [facility] Available via Epic secure chat 7am-7pm After these hours, please refer to coverage provider listed on [email or web address] 03/09/2022, 12:45 PM

## 2022-03-09 NOTE — Progress Notes (Signed)
Initial Nutrition Assessment  DOCUMENTATION CODES:   Not applicable  INTERVENTION:   Ensure Enlive po TID, each supplement provides 350 kcal and 20 grams of protein.  MVI po daily   Liberalize diet   Pt at high refeed risk; recommend monitor potassium, magnesium and phosphorus labs daily until stable  NUTRITION DIAGNOSIS:   Increased nutrient needs related to catabolic illness (COVID 19) as evidenced by estimated needs.  GOAL:   Patient will meet greater than or equal to 90% of their needs  MONITOR:   Supplement acceptance, PO intake, Labs, Weight trends, Skin, I & O's  REASON FOR ASSESSMENT:   Malnutrition Screening Tool    ASSESSMENT:   86 y.o. male with past medical history significant for advanced Alzheimer's dementia, CAD, MI, SVT, IBS, colon cancer, essential hypertension and BPH who is admitted with COVID 19.  RD working remotely.  Unable to reach pt by phone to obtain nutrition related history as pt with dementia. Suspect pt with poor appetite and oral intake at baseline r/t dementia. Per chart, pt is down 24lbs(13%) over the past 8 months; this is significant weight loss. Pt is documented to be eating 25% of meals in hospital. RD will add supplements and MVI to help pt meet his estimated needs. RD will also change pt to a mechanical soft diet. Pt is at high refeed risk. RD will obtain nutrition related exam at follow up. Pt is at high risk for malnutrition.   Medications reviewed and include: vitamin C, aspirin, celexa, zinc, dexamethasone  Labs reviewed: K 3.7 wnl, P 3.0 wnl, Mg 2.0 wnl  NUTRITION - FOCUSED PHYSICAL EXAM: Unable to perform at this time   Diet Order:   Diet Order             Diet Heart Room service appropriate? Yes; Fluid consistency: Thin  Diet effective now                  EDUCATION NEEDS:   No education needs have been identified at this time  Skin:  Skin Assessment: Reviewed RN Assessment  Last BM:  8/20- TYPE  3  Height:   Ht Readings from Last 1 Encounters:  03/07/22 '5\' 11"'$  (1.803 m)    Weight:   Wt Readings from Last 1 Encounters:  03/07/22 74.5 kg    Ideal Body Weight:  78 kg  BMI:  Body mass index is 22.91 kg/m.  Estimated Nutritional Needs:   Kcal:  1900-2200kcal/day  Protein:  95-110g/day  Fluid:  1.9-2.2L/day  Koleen Distance MS, RD, LDN Please refer to [facility] for RD and/or RD on-call/weekend/after hours pager

## 2022-03-09 NOTE — TOC Initial Note (Signed)
Transition of Care [facility]) - Initial/Assessment Note    Patient Details  Name: Jeffrey Zimmerman MRN: [id number] Date of Birth: 02-11-1936  Transition of Care [facility]) CM/SW Contact:    Leeroy Cha, RN Phone Number: 03/09/2022, 9:33 AM  Clinical Narrative:                 Patient is from [place] a pace pf the [facility] contract with patient.   Plan is to return to [facility] when stable.  Expected Discharge Plan: [place] [facility]) Barriers to Discharge: Continued Medical Work up   Patient Goals and CMS Choice Patient states their goals for this hospitalization and ongoing recovery are:: not stated CMS Medicare.gov Compare Post Acute Care list provided to:: Patient Represenative (must comment) (son) Choice offered to / list presented to : Adult Children  Expected Discharge Plan and Services Expected Discharge Plan: [place] [facility])   Discharge Planning Services: CM Consult Post Acute Care Choice: [place] Living arrangements for the past 2 months: [place]                                      Prior Living Arrangements/Services Living arrangements for the past 2 months: [place] Lives with:: Facility Resident Patient language and need for interpreter reviewed:: Yes Do you feel safe going back to the place where you live?: Yes            Criminal Activity/Legal Involvement Pertinent to Current Situation/Hospitalization: No - Comment as needed  Activities of Daily Living      Permission Sought/Granted                  Emotional Assessment Appearance:: Appears stated age Attitude/Demeanor/Rapport: Engaged Affect (typically observed): Calm Orientation: : Oriented to Self, Oriented to Place, Oriented to Situation Alcohol / Substance Use: Not Applicable Psych Involvement: No (comment)  Admission diagnosis:  Hypokalemia [E87.6] Hypoxia [R09.02] Acute respiratory  failure with hypoxia ([place]) [J96.01] Community acquired pneumonia, unspecified laterality [J18.9] Patient Active Problem List   Diagnosis Date Noted   Acute respiratory failure with hypoxia ([place]) 03/07/2022   Pneumonia due to COVID-19 virus 03/07/2022   Right-sided Bell's palsy 08/12/2021   Cataract 08/12/2021   AKI (acute kidney injury) ([place]) 08/07/2021   BPH (benign prostatic hyperplasia) 08/07/2021   Acute bacterial conjunctivitis of right eye    AMS (altered mental status) 78/29/5621   Complicated UTI (urinary tract infection) 07/08/2021   Hypokalemia 07/08/2021   Alzheimer's dementia with behavioral disturbance ([place]) 07/08/2021   Basal cell carcinoma (BCC) 06/23/2021   Bilateral lower extremity edema 04/10/2016   Old myocardial infarction 08/10/2013   Mixed hyperlipidemia 08/10/2013   Other malaise and fatigue 08/08/2013   Hypertension    Coronary artery disease    Cancer ([place])    Basal cell carcinoma    Gout    Irritable bowel syndrome    SVT (supraventricular tachycardia) ([place])    PCP:  [name], MD Pharmacy:   [facility], [place] [address] [place] [address] Phone: [phone] Fax: [place], [place] - [address] [place] [address] Phone: [phone] Fax: [phone]     Social Determinants of Health (SDOH) Interventions    Readmission Risk Interventions    08/04/2021    3:55  PM  Readmission Risk Prevention Plan  Transportation Screening Complete  Medication Review Press photographer) Complete  PCP or Specialist appointment within 3-5 days of discharge Not Complete  PCP/Specialist Appt Not Complete comments [place] not medically ready for discharge.  [place] or Home Care Consult Complete  SW Recovery Care/Counseling Consult Complete  Palliative Care Screening Not Applicable  Skilled Nursing Facility Not  Applicable

## 2022-03-10 ENCOUNTER — Other Ambulatory Visit: Payer: Self-pay

## 2022-03-10 DIAGNOSIS — J9601 Acute respiratory failure with hypoxia: Secondary | ICD-10-CM | POA: Diagnosis not present

## 2022-03-10 LAB — COMPREHENSIVE METABOLIC PANEL
ALT: 48 U/L — ABNORMAL HIGH (ref 0–44)
AST: 45 U/L — ABNORMAL HIGH (ref 15–41)
Albumin: 3.3 g/dL — ABNORMAL LOW (ref 3.5–5.0)
Alkaline Phosphatase: 57 U/L (ref 38–126)
Anion gap: 11 (ref 5–15)
BUN: 20 mg/dL (ref 8–23)
CO2: 27 mmol/L (ref 22–32)
Calcium: 8.6 mg/dL — ABNORMAL LOW (ref 8.9–10.3)
Chloride: 101 mmol/L (ref 98–111)
Creatinine, Ser: 0.66 mg/dL (ref 0.61–1.24)
GFR, Estimated: >60 mL/min (ref >60)
Glucose, Bld: 123 mg/dL — ABNORMAL HIGH (ref 70–99)
Potassium: 2.9 mmol/L — ABNORMAL LOW (ref 3.5–5.1)
Sodium: 139 mmol/L (ref 135–145)
Total Bilirubin: 1.1 mg/dL (ref 0.3–1.2)
Total Protein: 6 g/dL — ABNORMAL LOW (ref 6.5–8.1)

## 2022-03-10 LAB — CBC WITH DIFFERENTIAL/PLATELET
Abs Immature Granulocytes: 0.03 10*3/uL (ref 0.00–0.07)
Basophils Absolute: 0 10*3/uL (ref 0.0–0.1)
Basophils Relative: 0 %
Eosinophils Absolute: 0 10*3/uL (ref 0.0–0.5)
Eosinophils Relative: 0 %
HCT: 42.2 % (ref 39.0–52.0)
Hemoglobin: 14.3 g/dL (ref 13.0–17.0)
Immature Granulocytes: 0 %
Lymphocytes Relative: 7 %
Lymphs Abs: 0.5 10*3/uL — ABNORMAL LOW (ref 0.7–4.0)
MCH: 30.8 pg (ref 26.0–34.0)
MCHC: 33.9 g/dL (ref 30.0–36.0)
MCV: 90.8 fL (ref 80.0–100.0)
Monocytes Absolute: 0.2 10*3/uL (ref 0.1–1.0)
Monocytes Relative: 2 %
Neutro Abs: 7.3 10*3/uL (ref 1.7–7.7)
Neutrophils Relative %: 91 %
Platelets: 158 10*3/uL (ref 150–400)
RBC: 4.65 MIL/uL (ref 4.22–5.81)
RDW: 13.2 % (ref 11.5–15.5)
WBC: 8 10*3/uL (ref 4.0–10.5)
nRBC: 0 % (ref 0.0–0.2)

## 2022-03-10 LAB — MAGNESIUM: Magnesium: 1.9 mg/dL (ref 1.7–2.4)

## 2022-03-10 LAB — D-DIMER, QUANTITATIVE: D-Dimer, Quant: 1.67 ug/mL-FEU — ABNORMAL HIGH (ref 0.00–0.50)

## 2022-03-10 LAB — C-REACTIVE PROTEIN: CRP: 5.6 mg/dL — ABNORMAL HIGH (ref <1.0)

## 2022-03-10 MED ORDER — POTASSIUM CHLORIDE 20 MEQ PO PACK
40.0000 meq | PACK | ORAL | Status: AC
Start: 2022-03-10 — End: 2022-03-10
  Administered 2022-03-10 (×2): 40 meq via ORAL
  Filled 2022-03-10 (×2): qty 2

## 2022-03-10 MED ORDER — ORAL CARE MOUTH RINSE: 15.0000 mL | OROMUCOSAL | Status: DC | PRN

## 2022-03-10 MED ORDER — IPRATROPIUM-ALBUTEROL 20-100 MCG/ACT IN AERS
1.0000 | INHALATION_SPRAY | Freq: Four times a day (QID) | RESPIRATORY_TRACT | Status: DC | PRN
Start: 2022-03-10 — End: 2022-03-13

## 2022-03-10 MED ORDER — ORAL CARE MOUTH RINSE
15.0000 mL | OROMUCOSAL | Status: DC
  Administered 2022-03-10 – 2022-03-13 (×13): 15 mL via OROMUCOSAL

## 2022-03-10 NOTE — NC FL2 (Signed)
[place] LEVEL OF CARE SCREENING TOOL     IDENTIFICATION  Patient Name: Jeffrey Zimmerman Birthdate: 10/29/1935 Sex: male Admission Date (Current Location): 03/07/2022  [place] and [place] Number:  Herbalist and Address:  [facility],  [place], [place]      Provider Number: [id number]  Attending Physician Name and Address:  [place] Donnamarie Poag, DO  Relative Name and Phone Number:   [phone])    Current Level of Care: Hospital Recommended Level of Care: Nursing Facility Prior Approval Number:    Date Approved/Denied:   PASRR Number:    Discharge Plan: Other (Comment) (LTC)    Current Diagnoses: Patient Active Problem List   Diagnosis Date Noted   Acute respiratory failure with hypoxia ([place]) 03/07/2022   Pneumonia due to COVID-19 virus 03/07/2022   Right-sided Bell's palsy 08/12/2021   Cataract 08/12/2021   AKI (acute kidney injury) ([place]) 08/07/2021   BPH (benign prostatic hyperplasia) 08/07/2021   Acute bacterial conjunctivitis of right eye    AMS (altered mental status) 01/28/1974   Complicated UTI (urinary tract infection) 07/08/2021   Hypokalemia 07/08/2021   Alzheimer's dementia with behavioral disturbance ([place]) 07/08/2021   Basal cell carcinoma (BCC) 06/23/2021   Bilateral lower extremity edema 04/10/2016   Old myocardial infarction 08/10/2013   Mixed hyperlipidemia 08/10/2013   Other malaise and fatigue 08/08/2013   Hypertension    Coronary artery disease    Cancer (HCC)    Basal cell carcinoma    Gout    Irritable bowel syndrome    SVT (supraventricular tachycardia) (HCC)     Orientation RESPIRATION BLADDER Height & Weight     Self  Normal Incontinent Weight: 74.5 kg Height:  '5\' 11"'$  (180.3 cm)  BEHAVIORAL SYMPTOMS/MOOD NEUROLOGICAL BOWEL NUTRITION STATUS      Incontinent Diet  AMBULATORY STATUS COMMUNICATION OF NEEDS Skin   Extensive Assist Verbally Skin abrasions (R cheek  wound)                       Personal Care Assistance Level of Assistance  Bathing, Feeding, Dressing Bathing Assistance: Maximum assistance Feeding assistance: Maximum assistance Dressing Assistance: Maximum assistance     Functional Limitations Info  Sight, Hearing, Speech Sight Info: Adequate Hearing Info: Adequate Speech Info: Impaired (Dysphagia 3 diet)    SPECIAL CARE FACTORS FREQUENCY                       Contractures Contractures Info: Not present    Additional Factors Info  Code Status, Allergies Code Status Info:  (Full) Allergies Info:  (Codeine)           Current Medications (03/10/2022):  This is the current hospital active medication list Current Facility-Administered Medications  Medication Dose Route Frequency Provider Last Rate Last Admin   acetaminophen (TYLENOL) tablet 650 mg  650 mg Oral Q6H PRN Reubin Milan, MD       Or   acetaminophen (TYLENOL) suppository 650 mg  650 mg Rectal Q6H PRN Reubin Milan, MD       ascorbic acid (VITAMIN C) tablet 500 mg  500 mg Oral Daily Reubin Milan, MD   500 mg at 03/10/22 0840   aspirin EC tablet 81 mg  81 mg Oral Daily Reubin Milan, MD   81 mg at 03/10/22 8832   citalopram (CELEXA) tablet 20 mg  20 mg Oral Daily Tennis Must  Audelia Hives, MD   20 mg at 03/10/22 0839   dexamethasone (DECADRON) injection 6 mg  6 mg Intravenous Q24H Reubin Milan, MD   6 mg at 03/09/22 1712   feeding supplement (ENSURE ENLIVE / ENSURE PLUS) liquid 237 mL  237 mL Oral TID BM [place] Eric J, DO   237 mL at 03/10/22 1300   finasteride (PROSCAR) tablet 5 mg  5 mg Oral q morning Reubin Milan, MD   5 mg at 03/10/22 0839   guaiFENesin-dextromethorphan (ROBITUSSIN DM) 100-10 MG/5ML syrup 10 mL  10 mL Oral Q4H PRN Reubin Milan, MD       haloperidol lactate (HALDOL) injection 3 mg  3 mg Intravenous Q6H PRN Reubin Milan, MD   3 mg at 03/10/22 6333   hydrALAZINE (APRESOLINE) injection 10  mg  10 mg Intravenous Q6H PRN [place] Eric J, DO   10 mg at 03/09/22 5456   Ipratropium-Albuterol (COMBIVENT) respimat 1 puff  1 puff Inhalation Q6H PRN [place] Eric J, DO       multivitamin with minerals tablet 1 tablet  1 tablet Oral Daily [place] Eric J, DO   1 tablet at 03/10/22 0842   ondansetron [facility]) tablet 4 mg  4 mg Oral Q6H PRN Reubin Milan, MD       Or   ondansetron [facility]) injection 4 mg  4 mg Intravenous Q6H PRN Reubin Milan, MD       Oral care mouth rinse  15 mL Mouth Rinse 4 times per day [place] Eric J, DO   15 mL at 03/10/22 1200   Oral care mouth rinse  15 mL Mouth Rinse PRN [place] Donnamarie Poag, DO       tetrahydrozoline 0.05 % ophthalmic solution 1 drop  1 drop Right Eye TID PRN Reubin Milan, MD   1 drop at 03/08/22 2151   zinc sulfate capsule 220 mg  220 mg Oral Daily Reubin Milan, MD   220 mg at 03/10/22 0840     Discharge Medications: Please see discharge summary for a list of discharge medications.  Relevant Imaging Results:  Relevant Lab Results:   Additional Information SSN [id number]; From [facility]  Zarria Towell, Juliann Pulse, RN

## 2022-03-10 NOTE — Progress Notes (Signed)
PROGRESS NOTE    Jeffrey Zimmerman  KYH:[id number] DOB: 1935-11-25 DOA: 03/07/2022 PCP: [name], MD    Brief Narrative:   Jeffrey Zimmerman is a 86 y.o. male with past medical history significant for advanced Alzheimer's dementia, CAD, history of MI, history of SVT, IBS, history of colon cancer, essential hypertension who presented to [facility] ED on 8/19 via EMS from SNF due to hypoxia, fever.  No family was present at time of ED evaluation and patient with advanced dementia and received IV lorazepam and unable to provide further information.  In the ED, temperature 101.6 F, HR 92, RR 20, BP 157/75, SPO2 85% on room air.  WBC 5.5, hemoglobin 12.4, platelets 113.  INR 1.2.  Sodium 142, potassium 2.9, chloride 103, CO2 29, glucose 119, BUN 24, creatinine 0.74.  Total bilirubin 1.3, AST 35, ALT 27.  BNP 103.2.  Lactic acid 0.9.  Influenza A/B PCR negative.  COVID-19 PCR positive.  Chest x-ray with right upper lobe bronchopneumonia, no pleural effusion.  Blood cultures x2 drawn.  EDP consulted [facility] for admission for acute hypoxic respiratory failure secondary to COVID-19 viral infection complicated superimposed pneumonia  Assessment & Plan:   Acute hypoxic respiratory failure secondary to acute Covid-19 viral pneumonia,POA Patient presenting from SNF after being found with fever, hypoxia.  SPO2 85% on room air.  Temperature 101.6 F.  COVID-19 positive.  Chest x-ray also with concerning for pneumonia, which is likely viral in nature given patient is without leukocytosis and procalcitonin negative x2. --COVID test: + 8/19 --CRP 13.8>10.4>5.6 --ddimer 1.09>1.17> --Remdesivir, plan 5-day course (Day #3/3) --Continue Decadron 6 mg IV every 24 hours --prone for 2-3hrs every 12hrs if able --Continue supplemental oxygen, titrate to maintain SPO2 > 92%; currently on 3L [place] w/ SPO2 100% --Continue supportive care with albuterol MDI prn, vitamin C, zinc, Tylenol, antitussives  --Follow CBC, CMP, D-dimer, and  CRP daily --Continue airborne/contact isolation precautions for 10 days from initial diagnosis of 8/19; per social work, does not require quarantine period for return to SNF as he is a long-term resident  Hypokalemia Potassium 2.9 this morning, will replete. --Repeat electrolytes in a.m.  Hx SVT Sinus bradycardia Essential hypertension Patient with history of SVT, metoprolol tartrate 12.5 mg p.o. twice daily outpatient.  On telemetry, patient continues with slight bradycardia.  Also not currently tolerating any oral medications. --Discontinue home metoprolol tartrate --Hydralazine 10 mg IV q6h PRN SBP >165 or DBP >110 --Monitor on telemetry; monitor blood pressure  Depression/anxiety: --Celexa 20 mg p.o. daily if able to tolerate  BPH: Finasteride 5 mg p.o. daily if can tolerate  Advance Alzheimer's dementia --Delirium precautions --Get up during the day --Encourage a familiar face to remain present throughout the day --Keep blinds open and lights on during daylight hours --Minimize the use of opioids/benzodiazepines --Haldol as needed for agitation  Adult failure to thrive Patient with poor oral intake.  Etiology likely secondary to acute viral infection as above with COVID.  Treated as above. --Assistance with meals --Dietitian consult --Supportive care --Will need further goals of care discussions outpatient, ultimately very poor prognosis given his advanced age and advanced dementia, discussed with patient's son Camillia Herter but wants to continue aggressive measures at this time.  Would recommend outpatient palliative care to follow.   DVT prophylaxis: SCDs Start: 03/07/22 1425    Code Status: Full Code Family Communication: No family present at bedside this morning; updated patient's son Dylan telephone yesterday afternoon who wanted to continue with aggressive care, full  code.  Disposition Plan:  Level of care: Progressive Status is: Inpatient Remains inpatient appropriate  because: Continues with poor oral intake, IV remdesivir, overall very poor prognosis given his advanced age, comorbidities coupled with COVID-pneumonia    Consultants:  None  Procedures:  None  Antimicrobials:  Azithromycin 8/19 - 8/21 Ceftriaxone 8/19 - 8/21 Remdesivir 8/20>>   Subjective: Patient seen examined at bedside, resting lying in bed, alert.  Talking about that he was in an accident with his wife and 3 woman died.  Wants to know where his wife is.  No other specific complaints this morning.  Remains pleasantly confused.  Has mitts in place due to attempts to remove medical equipment/IV.  Remains on supplemental oxygen but SPO2 100% at rest on 2 L nasal cannula. Unable to obtain any further ROS from patient this morning given his underlying dementia.  No acute concerns overnight per nursing staff.  Objective: Vitals:   03/10/22 0400 03/10/22 0745 03/10/22 1152 03/10/22 1223  BP: (!) 171/75   (!) 163/84  Pulse: 71 63    Resp: 20 20    Temp: (!) 97.2 F (36.2 C)   97.9 F (36.6 C)  TempSrc: Axillary   Axillary  SpO2: 100% 96% 95%   Weight:      Height:        Intake/Output Summary (Last 24 hours) at 03/10/2022 1236 Last data filed at 03/10/2022 0900 Gross per 24 hour  Intake 120 ml  Output 2650 ml  Net -2530 ml   Filed Weights   03/07/22 1940  Weight: 74.5 kg    Examination:  Physical Exam: GEN: NAD, alert, pleasantly confused, chronically ill in appearance HEENT: NCAT, PERRL, EOMI, sclera clear, dry mucous membranes, poor dentition PULM: Diminished breath sounds bilateral bases, no wheezes/crackles, normal respiratory effort without accessory muscle use, on 2 L nasal cannula with SPO2 100% at rest CV: Bradycardic, regular rhythm w/o M/G/R GI: abd soft, NTND, NABS, no R/G/M MSK: no peripheral edema, moves all extremities independently   Data Reviewed: I have personally reviewed following labs and imaging studies  CBC: Recent Labs  Lab 03/07/22 1110  03/09/22 0458 03/10/22 0512  WBC 5.5 6.6 8.0  NEUTROABS 4.8 6.0 7.3  HGB 12.4* 13.1 14.3  HCT 36.8* 40.3 42.2  MCV 94.4 94.8 90.8  PLT 113* 127* 622   Basic Metabolic Panel: Recent Labs  Lab 03/07/22 1110 03/09/22 0458 03/10/22 0512  NA 142 145 139  K 2.9* 3.7 2.9*  CL 103 109 101  CO2 '29 28 27  '$ GLUCOSE 119* 144* 123*  BUN 24* 23 20  CREATININE 0.74 0.70 0.66  CALCIUM 8.5* 8.3* 8.6*  MG 2.4 2.0 1.9  PHOS  --  3.0  --    GFR: Estimated Creatinine Clearance: 72.4 mL/min (by C-G formula based on SCr of 0.66 mg/dL). Liver Function Tests: Recent Labs  Lab 03/07/22 1110 03/09/22 0458 03/10/22 0512  AST 35 54* 45*  ALT 27 42 48*  ALKPHOS 52 52 57  BILITOT 1.3* 0.8 1.1  PROT 6.3* 5.6* 6.0*  ALBUMIN 3.7 3.0* 3.3*   No results for input(s): "LIPASE", "AMYLASE" in the last 168 hours. No results for input(s): "AMMONIA" in the last 168 hours. Coagulation Profile: Recent Labs  Lab 03/07/22 1110  INR 1.2   Cardiac Enzymes: No results for input(s): "CKTOTAL", "CKMB", "CKMBINDEX", "TROPONINI" in the last 168 hours. BNP (last 3 results) No results for input(s): "PROBNP" in the last 8760 hours. HbA1C: No results for input(s): "HGBA1C"  in the last 72 hours. CBG: No results for input(s): "GLUCAP" in the last 168 hours. Lipid Profile: No results for input(s): "CHOL", "HDL", "LDLCALC", "TRIG", "CHOLHDL", "LDLDIRECT" in the last 72 hours. Thyroid Function Tests: No results for input(s): "TSH", "T4TOTAL", "FREET4", "T3FREE", "THYROIDAB" in the last 72 hours. Anemia Panel: Recent Labs    03/09/22 0458  FERRITIN 332   Sepsis Labs: Recent Labs  Lab 03/07/22 1110 03/07/22 1434 03/09/22 0458  PROCALCITON <0.10  --  <0.10  LATICACIDVEN 0.9 0.7  --     Recent Results (from the past 240 hour(s))  Blood Culture (routine x 2)     Status: None (Preliminary result)   Collection Time: 03/07/22 11:10 AM   Specimen: BLOOD  Result Value Ref Range Status   Specimen  Description   Final    BLOOD SITE NOT SPECIFIED Performed at [facility] [address] [place], [place] [address]    Special Requests   Final    BOTTLES DRAWN AEROBIC AND ANAEROBIC Blood Culture adequate volume Performed at [facility] [address] [place], [place] [address]    Culture   Final    NO GROWTH 3 DAYS Performed at [facility], [place] [address], [place], [place] [address]    Report Status PENDING  Incomplete  Resp Panel by RT-PCR (Flu A&B, Covid) Anterior Nasal Swab     Status: Abnormal   Collection Time: 03/07/22 11:30 AM   Specimen: Anterior Nasal Swab  Result Value Ref Range Status   SARS Coronavirus 2 by RT PCR POSITIVE (A) NEGATIVE Final    Comment: CRITICAL RESULT CALLED TO, READ BACK BY AND VERIFIED WITH: PROGO,A AT 1613 ON 03/07/22 BY LUZOLOP (NOTE) SARS-CoV-2 target nucleic acids are DETECTED.  The SARS-CoV-2 RNA is generally detectable in upper respiratory specimens during the acute phase of infection. Positive results are indicative of the presence of the identified virus, but do not rule out bacterial infection or co-infection with other pathogens not detected by the test. Clinical correlation with patient history and other diagnostic information is necessary to determine patient infection status. The expected result is Negative.  Fact Sheet for Patients: EntrepreneurPulse.com.au  Fact Sheet for Healthcare Providers: IncredibleEmployment.be  This test is not yet approved or cleared by the [place] FDA and  has been authorized for detection and/or diagnosis of SARS-CoV-2 by FDA under an Emergency Use Authorization (EUA).  This EUA will remain in effect (meaning thi s test can be used) for the duration of  the COVID-19 declaration under Section 564(b)(1) of the Act, 21 U.S.C. section 360bbb-3(b)(1), unless the authorization is terminated or revoked sooner.      Influenza A by PCR NEGATIVE NEGATIVE Final   Influenza B by PCR NEGATIVE NEGATIVE Final    Comment: (NOTE) The Xpert Xpress SARS-CoV-2/FLU/RSV plus assay is intended as an aid in the diagnosis of influenza from Nasopharyngeal swab specimens and should not be used as a sole basis for treatment. Nasal washings and aspirates are unacceptable for Xpert Xpress SARS-CoV-2/FLU/RSV testing.  Fact Sheet for Patients: EntrepreneurPulse.com.au  Fact Sheet for Healthcare Providers: IncredibleEmployment.be  This test is not yet approved or cleared by the [place] FDA and has been authorized for detection and/or diagnosis of SARS-CoV-2 by FDA under an Emergency Use Authorization (EUA). This EUA will remain in effect (meaning this test can be used) for the duration of the COVID-19 declaration under Section 564(b)(1) of the Act, 21 U.S.C. section 360bbb-3(b)(1), unless the authorization  is terminated or revoked.  Performed at [facility], [place] [address] [place], [place] [address]          Radiology Studies: No results found.      Scheduled Meds:  ascorbic acid  500 mg Oral Daily   aspirin EC  81 mg Oral Daily   citalopram  20 mg Oral Daily   dexamethasone (DECADRON) injection  6 mg Intravenous Q24H   feeding supplement  237 mL Oral TID BM   finasteride  5 mg Oral q morning   multivitamin with minerals  1 tablet Oral Daily   mouth rinse  15 mL Mouth Rinse 4 times per day   potassium chloride  40 mEq Oral Q3H   zinc sulfate  220 mg Oral Daily   Continuous Infusions:     LOS: 3 days    Time spent: 51 minutes spent on chart review, discussion with nursing staff, consultants, updating family and interview/physical exam; more than 50% of that time was spent in counseling and/or coordination of care.    Ameer Sanden J [place] DO [facility] Available via Epic secure chat 7am-7pm After these hours, please refer to  coverage provider listed on [email or web address] 03/10/2022, 12:36 PM

## 2022-03-10 NOTE — TOC Progression Note (Signed)
Transition of Care [facility]) - Progression Note    Patient Details  Name: FURKAN KEENUM MRN: [id number] Date of Birth: [birth year]  Transition of Care [facility]) CM/SW Contact  Flara Storti, Juliann Pulse, RN Phone Number: 03/10/2022, 9:59 AM  Clinical Narrative:  From [place] to return.No PT cons needed.     Expected Discharge Plan: [place] ([place]-LTC) Barriers to Discharge: Continued Medical Work up  Expected Discharge Plan and Services Expected Discharge Plan: [place] ([place])   Discharge Planning Services: CM Consult Post Acute Care Choice: [place] arrangements for the past 2 months: [place]                                       Social Determinants of Health (SDOH) Interventions    Readmission Risk Interventions    08/04/2021    3:55 PM  Readmission Risk Prevention Plan  Transportation Screening Complete  Medication Review (RN Care Manager) Complete  PCP or Specialist appointment within 3-5 days of discharge Not Complete  PCP/Specialist Appt Not Complete comments [place] not medically ready for discharge.  [place] or Home Care Consult Complete  SW Recovery Care/Counseling Consult Complete  Palliative Care Screening Not Applicable  Skilled Nursing Facility Not Applicable

## 2022-03-10 NOTE — Care Management Important Message (Signed)
Important Message  Patient Details IM Letter placed in Patients room. Name: Jeffrey Zimmerman MRN: [id number] Date of Birth: 11-06-1935   Medicare Important Message Given:  Yes     Kerin Salen 03/10/2022, 1:50 PM

## 2022-03-10 NOTE — TOC Progression Note (Signed)
Transition of Care [facility]) - Progression Note    Patient Details  Name: ORLANDUS BOROWSKI MRN: [id number] Date of Birth: 1935/09/25  Transition of Care [facility]) CM/SW Contact  Areli Jowett, Juliann Pulse, RN Phone Number: 03/10/2022, 2:23 PM  Clinical Narrative:fl2 sent to [facility].       Expected Discharge Plan: [place] ([place]-[facility]) Barriers to Discharge: Continued Medical Work up  Expected Discharge Plan and Services Expected Discharge Plan: [place] ([place]-[facility])   Discharge Planning Services: CM Consult Post Acute Care Choice: [place] Living arrangements for the past 2 months: [place]                                       Social Determinants of Health (SDOH) Interventions    Readmission Risk Interventions    03/10/2022   10:01 AM 08/04/2021    3:55 PM  Readmission Risk Prevention Plan  Transportation Screening Complete Complete  PCP or Specialist Appt within 3-5 Days Complete   HRI or [place] Complete   Social Work Consult for [place] Planning/Counseling Complete   Palliative Care Screening Not Applicable   Medication Review [employer]) Complete Complete  PCP or Specialist appointment within 3-5 days of discharge  Not Complete  PCP/Specialist Appt Not Complete comments  [place] not medically ready for discharge.  [place] or Home Care Consult  Complete  SW Recovery Care/Counseling Consult  Complete  Palliative Care Screening  Not Applicable  Skilled Nursing Facility  Not Applicable

## 2022-03-11 DIAGNOSIS — I471 Supraventricular tachycardia: Secondary | ICD-10-CM

## 2022-03-11 DIAGNOSIS — U071 COVID-19: Secondary | ICD-10-CM | POA: Diagnosis not present

## 2022-03-11 DIAGNOSIS — G309 Alzheimer's disease, unspecified: Secondary | ICD-10-CM

## 2022-03-11 DIAGNOSIS — Z515 Encounter for palliative care: Secondary | ICD-10-CM

## 2022-03-11 DIAGNOSIS — J9601 Acute respiratory failure with hypoxia: Secondary | ICD-10-CM | POA: Diagnosis not present

## 2022-03-11 DIAGNOSIS — J1282 Pneumonia due to coronavirus disease 2019: Secondary | ICD-10-CM

## 2022-03-11 DIAGNOSIS — F02818 Dementia in other diseases classified elsewhere, unspecified severity, with other behavioral disturbance: Secondary | ICD-10-CM

## 2022-03-11 LAB — COMPREHENSIVE METABOLIC PANEL
ALT: 40 U/L (ref 0–44)
AST: 30 U/L (ref 15–41)
Albumin: 3.3 g/dL — ABNORMAL LOW (ref 3.5–5.0)
Alkaline Phosphatase: 59 U/L (ref 38–126)
Anion gap: 12 (ref 5–15)
BUN: 26 mg/dL — ABNORMAL HIGH (ref 8–23)
CO2: 27 mmol/L (ref 22–32)
Calcium: 8.5 mg/dL — ABNORMAL LOW (ref 8.9–10.3)
Chloride: 102 mmol/L (ref 98–111)
Creatinine, Ser: 0.57 mg/dL — ABNORMAL LOW (ref 0.61–1.24)
GFR, Estimated: >60 mL/min (ref >60)
Glucose, Bld: 128 mg/dL — ABNORMAL HIGH (ref 70–99)
Potassium: 3 mmol/L — ABNORMAL LOW (ref 3.5–5.1)
Sodium: 141 mmol/L (ref 135–145)
Total Bilirubin: 1.2 mg/dL (ref 0.3–1.2)
Total Protein: 6.2 g/dL — ABNORMAL LOW (ref 6.5–8.1)

## 2022-03-11 LAB — CBC WITH DIFFERENTIAL/PLATELET
Abs Immature Granulocytes: 0.09 10*3/uL — ABNORMAL HIGH (ref 0.00–0.07)
Basophils Absolute: 0 10*3/uL (ref 0.0–0.1)
Basophils Relative: 0 %
Eosinophils Absolute: 0 10*3/uL (ref 0.0–0.5)
Eosinophils Relative: 0 %
HCT: 45.1 % (ref 39.0–52.0)
Hemoglobin: 15.6 g/dL (ref 13.0–17.0)
Immature Granulocytes: 1 %
Lymphocytes Relative: 6 %
Lymphs Abs: 0.5 10*3/uL — ABNORMAL LOW (ref 0.7–4.0)
MCH: 31.5 pg (ref 26.0–34.0)
MCHC: 34.6 g/dL (ref 30.0–36.0)
MCV: 90.9 fL (ref 80.0–100.0)
Monocytes Absolute: 0.3 10*3/uL (ref 0.1–1.0)
Monocytes Relative: 3 %
Neutro Abs: 7.1 10*3/uL (ref 1.7–7.7)
Neutrophils Relative %: 90 %
Platelets: 196 10*3/uL (ref 150–400)
RBC: 4.96 MIL/uL (ref 4.22–5.81)
RDW: 13.2 % (ref 11.5–15.5)
WBC: 8 10*3/uL (ref 4.0–10.5)
nRBC: 0 % (ref 0.0–0.2)

## 2022-03-11 LAB — C-REACTIVE PROTEIN: CRP: 2.9 mg/dL — ABNORMAL HIGH (ref <1.0)

## 2022-03-11 LAB — D-DIMER, QUANTITATIVE: D-Dimer, Quant: 1.39 ug/mL-FEU — ABNORMAL HIGH (ref 0.00–0.50)

## 2022-03-11 LAB — MAGNESIUM: Magnesium: 2.1 mg/dL (ref 1.7–2.4)

## 2022-03-11 MED ORDER — ENSURE ENLIVE PO LIQD
237.0000 mL | Freq: Four times a day (QID) | ORAL | Status: DC
  Administered 2022-03-11 – 2022-03-13 (×7): 237 mL via ORAL

## 2022-03-11 MED ORDER — POTASSIUM CHLORIDE CRYS ER 20 MEQ PO TBCR
40.0000 meq | EXTENDED_RELEASE_TABLET | ORAL | Status: AC
  Administered 2022-03-11 (×2): 40 meq via ORAL
  Filled 2022-03-11 (×2): qty 2

## 2022-03-11 MED ORDER — ROSUVASTATIN CALCIUM 20 MG PO TABS
20.0000 mg | ORAL_TABLET | Freq: Every day | ORAL | Status: DC
  Administered 2022-03-11 – 2022-03-13 (×3): 20 mg via ORAL
  Filled 2022-03-11 (×3): qty 1

## 2022-03-11 NOTE — Progress Notes (Signed)
Nutrition Follow-up  DOCUMENTATION CODES:   Not applicable  INTERVENTION:  - will increase Ensure to QID--will provide 1400 kcal (74% kcal need) and 80 grams protein (84% protein need).    NUTRITION DIAGNOSIS:   Increased nutrient needs related to catabolic illness (COVID 19) as evidenced by estimated needs. -ongoing  GOAL:   Patient will meet greater than or equal to 90% of their needs -unmet  MONITOR:   PO intake, Supplement acceptance, Labs, Weight trends  REASON FOR ASSESSMENT:   Consult Assessment of nutrition requirement/status  ASSESSMENT:   86 y.o. male with past medical history significant for advanced Alzheimer's dementia, CAD, MI, SVT, IBS, colon cancer, essential hypertension and BPH who is admitted with COVID 19.  Patient is noted to be a/o to self only. He has been eating 0-25% at meals since breakfast on 03/08/22.  Able to talk with RN via secure chat and she shares that patient drink 100% of Ensure this AM (350 kcal and 20 grams protein) and ate a few bites of food.   Ensure ordered TID and patient has accepted all bottles offered. If he comes 100%/day he is consuming 1050 kcal (55% kcal need) and 60 grams protein (63% protein need).   He has not been weighed since admission on 8/19. No information documented in the edema section of flow sheet this admission.  Per notes: - advanced Alzheimer's dementia - overall very poor prognosis - possible plan for return back to SNF   Placement of feeding tube is contraindicated for those with advanced dementia as risk outweighs benefit.    Labs reviewed; K: 3 mmol/l, BUN: 26 mg/dl, creatinine: 0.57 mg/dl, Ca: 8.5 mg/dl. Medications reviewed; 500 mg ascorbic acid/day, 1 tablet multivitamin with minerals/day, 40 mEq Klor-Con x2 doses 8/23, 220 mg zinc sulfate/day.   NUTRITION - FOCUSED PHYSICAL EXAM:  Deferred.   Diet Order:   Diet Order             DIET DYS 3 Room service appropriate? No; Fluid  consistency: Thin  Diet effective now                   EDUCATION NEEDS:   Not appropriate for education at this time  Skin:  Skin Assessment: Reviewed RN Assessment  Last BM:  8/21 (type 3 x1, large amount)  Height:   Ht Readings from Last 1 Encounters:  03/07/22 '5\' 11"'$  (1.803 m)    Weight:   Wt Readings from Last 1 Encounters:  03/07/22 74.5 kg     BMI:  Body mass index is 22.91 kg/m.  Estimated Nutritional Needs:  Kcal:  1900-2200kcal/day Protein:  95-110g/day Fluid:  1.9-2.2L/day      Jarome Matin, MS, RD, LDN, CNSC Registered Dietitian II Inpatient Clinical Nutrition RD pager # and on-call/weekend pager # available in [place]

## 2022-03-11 NOTE — Progress Notes (Signed)
     Referral received for Jeffrey Zimmerman for goals of care discussion. Chart reviewed and updates received from RN. Patient assessed and is unable to engage appropriately in discussions.   Attempted to contact patient's don Jeffrey Zimmerman. Unable to reach. Voicemail left with contact information given.   PMT will re-attempt to contact family at a later time/date. Detailed note and recommendations to follow once GOC has been completed.   Thank you for your referral and allowing PMT to assist in [name]'s care.   Walden Field, NP Palliative Medicine Team Phone: [phone]  NO CHARGE

## 2022-03-11 NOTE — Progress Notes (Signed)
PROGRESS NOTE    REAL CONA  PTW:[id number] DOB: 1936-06-08 DOA: 03/07/2022 PCP: [name], MD   Brief Narrative:  86 y.o. male with past medical history significant for advanced Alzheimer's dementia, CAD, history of MI, history of SVT, IBS, history of colon cancer, essential hypertension presented from SNF due to hypoxia and fever.  On presentation, he was febrile, hypoxic.  COVID-19 was positive.  He was started on Decadron and remdesivir.  Assessment & Plan:   Acute respiratory failure secondary to hypoxia COVID-19 pneumonia: Present on admission -Tested positive on 03/07/2022.  Currently requiring 2 L oxygen via nasal cannula.  Wean off as able. -Monitor inflammatory markers: Improving COVID-19 Labs  Recent Labs    03/09/22 0458 03/10/22 0512 03/11/22 0539  DDIMER 1.17* 1.67* 1.39*  FERRITIN 332  --   --   CRP 10.4* 5.6* 2.9*    Lab Results  Component Value Date   SARSCOV2NAA POSITIVE (A) 03/07/2022   [place] NEGATIVE 08/11/2021   [place] NEGATIVE 08/07/2021   [place] NEGATIVE 08/02/2021   -Currently on IV Decadron.  Completed 3 days of IV remdesivir.  Advanced Alzheimer's dementia Adult failure to thrive -Very poor oral intake.  Overall prognosis is very poor.  Currently listed as full code.  Consult held of care for goals of care discussion. -Fall precautions. -Patient will possibly return back to SNF. -Follow nutrition recommendations  Hypokalemia--replace.  Repeat a.m. labs  Depression/anxiety -continue Celexa  BPH -Continue finasteride  History of SVT Hypertension -Currently mostly rate controlled.  Home metoprolol discontinued because of bradycardia.  Blood pressure currently stable   DVT prophylaxis: SCDs Code Status: Full Family Communication: None at bedside Disposition Plan: Status is: Inpatient Remains inpatient appropriate because: Of severity of illness.  Very poor oral intake.  Consultants: Palliative  care  Procedures: None  Antimicrobials:  Anti-infectives (From admission, onward)    Start     Dose/Rate Route Frequency Ordered Stop   03/09/22 1000  remdesivir 100 mg in sodium chloride 0.9 % 100 mL IVPB       See Hyperspace for full Linked Orders Report.   100 mg 200 mL/hr over 30 Minutes Intravenous Daily 03/08/22 1239 03/10/22 1130   03/08/22 1600  azithromycin (ZITHROMAX) 500 mg in sodium chloride 0.9 % 250 mL IVPB  Status:  Discontinued        500 mg 250 mL/hr over 60 Minutes Intravenous Every 24 hours 03/07/22 1452 03/09/22 1244   03/08/22 1400  cefTRIAXone (ROCEPHIN) 2 g in sodium chloride 0.9 % 100 mL IVPB  Status:  Discontinued        2 g 200 mL/hr over 30 Minutes Intravenous Every 24 hours 03/07/22 1452 03/09/22 1244   03/08/22 1400  remdesivir 200 mg in sodium chloride 0.9% 250 mL IVPB       See Hyperspace for full Linked Orders Report.   200 mg 580 mL/hr over 30 Minutes Intravenous Once 03/08/22 1239 03/10/22 0021   03/07/22 1545  nirmatrelvir & ritonavir (PAXLOVID) 3 tablet  Status:  Discontinued        3 tablet Oral 2 times daily 03/07/22 1536 03/07/22 1541   03/07/22 1545  nirmatrelvir/ritonavir EUA (PAXLOVID) 3 tablet  Status:  Discontinued        3 tablet Oral 2 times daily 03/07/22 1541 03/08/22 1239   03/07/22 1530  molnupiravir EUA (LAGEVRIO) capsule 800 mg  Status:  Discontinued        4 capsule Oral 2 times daily 03/07/22 1526 03/07/22 1536  03/07/22 1445  nirmatrelvir & ritonavir (PAXLOVID) 2 tablet  Status:  Discontinued        2 tablet Oral 2 times daily 03/07/22 1443 03/07/22 1526   03/07/22 1415  cefTRIAXone (ROCEPHIN) 1 g in sodium chloride 0.9 % 100 mL IVPB        1 g 200 mL/hr over 30 Minutes Intravenous  Once 03/07/22 1410 03/07/22 1503   03/07/22 1415  azithromycin (ZITHROMAX) 500 mg in sodium chloride 0.9 % 250 mL IVPB        500 mg 250 mL/hr over 60 Minutes Intravenous  Once 03/07/22 1410 03/07/22 1655        Subjective: Patient seen and  examined at bedside.  No fever, agitation, seizures reported.  Confused.  Objective: Vitals:   03/10/22 2304 03/11/22 0601 03/11/22 0808 03/11/22 1149  BP: (!) 162/84 (!) 162/98 (!) 149/81 (!) 163/83  Pulse: 76  67 79  Resp: '16 18 18 18  '$ Temp: 97.8 F (36.6 C) 97.9 F (36.6 C) 97.7 F (36.5 C) 98.4 F (36.9 C)  TempSrc: Oral Oral Oral Oral  SpO2: 97% 96%  97%  Weight:      Height:        Intake/Output Summary (Last 24 hours) at 03/11/2022 1150 Last data filed at 03/11/2022 0900 Gross per 24 hour  Intake 1437 ml  Output 1850 ml  Net -413 ml   Filed Weights   03/07/22 1940  Weight: 74.5 kg    Examination:  General exam: Appears calm and comfortable.  Currently on 2 L oxygen via nasal cannula.  Looks chronically ill and deconditioned. Respiratory system: Bilateral decreased breath sounds at bases crackles Cardiovascular system: S1 & S2 heard, Rate controlled Gastrointestinal system: Abdomen is nondistended, soft and nontender. Normal bowel sounds heard. Extremities: No cyanosis, clubbing; trace lower extremity edema Central nervous system: Use.  No focal neurological deficits. Moving extremities Skin: No rashes, lesions or ulcers Psychiatry: Light affect.  Not agitated.   Data Reviewed: I have personally reviewed following labs and imaging studies  CBC: Recent Labs  Lab 03/07/22 1110 03/09/22 0458 03/10/22 0512 03/11/22 0539  WBC 5.5 6.6 8.0 8.0  NEUTROABS 4.8 6.0 7.3 7.1  HGB 12.4* 13.1 14.3 15.6  HCT 36.8* 40.3 42.2 45.1  MCV 94.4 94.8 90.8 90.9  PLT 113* 127* 158 540   Basic Metabolic Panel: Recent Labs  Lab 03/07/22 1110 03/09/22 0458 03/10/22 0512 03/11/22 0539  NA 142 145 139 141  K 2.9* 3.7 2.9* 3.0*  CL 103 109 101 102  CO2 '29 28 27 27  '$ GLUCOSE 119* 144* 123* 128*  BUN 24* 23 20 26*  CREATININE 0.74 0.70 0.66 0.57*  CALCIUM 8.5* 8.3* 8.6* 8.5*  MG 2.4 2.0 1.9 2.1  PHOS  --  3.0  --   --    GFR: Estimated Creatinine Clearance: 72.4  mL/min (A) (by C-G formula based on SCr of 0.57 mg/dL (L)). Liver Function Tests: Recent Labs  Lab 03/07/22 1110 03/09/22 0458 03/10/22 0512 03/11/22 0539  AST 35 54* 45* 30  ALT 27 42 48* 40  ALKPHOS 52 52 57 59  BILITOT 1.3* 0.8 1.1 1.2  PROT 6.3* 5.6* 6.0* 6.2*  ALBUMIN 3.7 3.0* 3.3* 3.3*   No results for input(s): "LIPASE", "AMYLASE" in the last 168 hours. No results for input(s): "AMMONIA" in the last 168 hours. Coagulation Profile: Recent Labs  Lab 03/07/22 1110  INR 1.2   Cardiac Enzymes: No results for input(s): "CKTOTAL", "CKMB", "CKMBINDEX", "TROPONINI"  in the last 168 hours. BNP (last 3 results) No results for input(s): "PROBNP" in the last 8760 hours. HbA1C: No results for input(s): "HGBA1C" in the last 72 hours. CBG: No results for input(s): "GLUCAP" in the last 168 hours. Lipid Profile: No results for input(s): "CHOL", "HDL", "LDLCALC", "TRIG", "CHOLHDL", "LDLDIRECT" in the last 72 hours. Thyroid Function Tests: No results for input(s): "TSH", "T4TOTAL", "FREET4", "T3FREE", "THYROIDAB" in the last 72 hours. Anemia Panel: Recent Labs    03/09/22 0458  FERRITIN 332   Sepsis Labs: Recent Labs  Lab 03/07/22 1110 03/07/22 1434 03/09/22 0458  PROCALCITON <0.10  --  <0.10  LATICACIDVEN 0.9 0.7  --     Recent Results (from the past 240 hour(s))  Blood Culture (routine x 2)     Status: None (Preliminary result)   Collection Time: 03/07/22 11:10 AM   Specimen: BLOOD  Result Value Ref Range Status   Specimen Description   Final    BLOOD SITE NOT SPECIFIED Performed at [facility] [address], [place], [place] [address]    Special Requests   Final    BOTTLES DRAWN AEROBIC AND ANAEROBIC Blood Culture adequate volume Performed at [facility] [address] [place], [place] [address]    Culture   Final    NO GROWTH 4 DAYS Performed at [facility], [place] [address] [place], [place] [address]     Report Status PENDING  Incomplete  Resp Panel by RT-PCR (Flu A&B, Covid) Anterior Nasal Swab     Status: Abnormal   Collection Time: 03/07/22 11:30 AM   Specimen: Anterior Nasal Swab  Result Value Ref Range Status   SARS Coronavirus 2 by RT PCR POSITIVE (A) NEGATIVE Final    Comment: CRITICAL RESULT CALLED TO, READ BACK BY AND VERIFIED WITH: PROGO,A AT 1613 ON 03/07/22 BY LUZOLOP (NOTE) SARS-CoV-2 target nucleic acids are DETECTED.  The SARS-CoV-2 RNA is generally detectable in upper respiratory specimens during the acute phase of infection. Positive results are indicative of the presence of the identified virus, but do not rule out bacterial infection or co-infection with other pathogens not detected by the test. Clinical correlation with patient history and other diagnostic information is necessary to determine patient infection status. The expected result is Negative.  Fact Sheet for Patients: EntrepreneurPulse.com.au  Fact Sheet for Healthcare Providers: IncredibleEmployment.be  This test is not yet approved or cleared by the [place] FDA and  has been authorized for detection and/or diagnosis of SARS-CoV-2 by FDA under an Emergency Use Authorization (EUA).  This EUA will remain in effect (meaning thi s test can be used) for the duration of  the COVID-19 declaration under Section 564(b)(1) of the Act, 21 U.S.C. section 360bbb-3(b)(1), unless the authorization is terminated or revoked sooner.     Influenza A by PCR NEGATIVE NEGATIVE Final   Influenza B by PCR NEGATIVE NEGATIVE Final    Comment: (NOTE) The Xpert Xpress SARS-CoV-2/FLU/RSV plus assay is intended as an aid in the diagnosis of influenza from Nasopharyngeal swab specimens and should not be used as a sole basis for treatment. Nasal washings and aspirates are unacceptable for Xpert Xpress SARS-CoV-2/FLU/RSV testing.  Fact Sheet for  Patients: EntrepreneurPulse.com.au  Fact Sheet for Healthcare Providers: IncredibleEmployment.be  This test is not yet approved or cleared by the [place] FDA and has been authorized for detection and/or diagnosis of SARS-CoV-2 by FDA under an Emergency Use Authorization (EUA). This EUA will remain in effect (meaning  this test can be used) for the duration of the COVID-19 declaration under Section 564(b)(1) of the Act, 21 U.S.C. section 360bbb-3(b)(1), unless the authorization is terminated or revoked.  Performed at [facility], [place] [address], [place], [place] [address]          Radiology Studies: No results found.      Scheduled Meds:  ascorbic acid  500 mg Oral Daily   aspirin EC  81 mg Oral Daily   citalopram  20 mg Oral Daily   dexamethasone (DECADRON) injection  6 mg Intravenous Q24H   feeding supplement  237 mL Oral TID BM   finasteride  5 mg Oral q morning   multivitamin with minerals  1 tablet Oral Daily   mouth rinse  15 mL Mouth Rinse 4 times per day   potassium chloride  40 mEq Oral Q4H   rosuvastatin  20 mg Oral Daily   zinc sulfate  220 mg Oral Daily   Continuous Infusions:        Aline August, MD [facility] 03/11/2022, 11:50 AM

## 2022-03-12 DIAGNOSIS — R627 Adult failure to thrive: Secondary | ICD-10-CM

## 2022-03-12 DIAGNOSIS — N400 Enlarged prostate without lower urinary tract symptoms: Secondary | ICD-10-CM

## 2022-03-12 DIAGNOSIS — J9601 Acute respiratory failure with hypoxia: Secondary | ICD-10-CM | POA: Diagnosis not present

## 2022-03-12 DIAGNOSIS — I10 Essential (primary) hypertension: Secondary | ICD-10-CM

## 2022-03-12 DIAGNOSIS — G309 Alzheimer's disease, unspecified: Secondary | ICD-10-CM | POA: Diagnosis not present

## 2022-03-12 LAB — CBC WITH DIFFERENTIAL/PLATELET
Abs Immature Granulocytes: 0.1 10*3/uL — ABNORMAL HIGH (ref 0.00–0.07)
Basophils Absolute: 0 10*3/uL (ref 0.0–0.1)
Basophils Relative: 0 %
Eosinophils Absolute: 0 10*3/uL (ref 0.0–0.5)
Eosinophils Relative: 0 %
HCT: 46.2 % (ref 39.0–52.0)
Hemoglobin: 15.1 g/dL (ref 13.0–17.0)
Immature Granulocytes: 1 %
Lymphocytes Relative: 6 %
Lymphs Abs: 0.5 10*3/uL — ABNORMAL LOW (ref 0.7–4.0)
MCH: 30.4 pg (ref 26.0–34.0)
MCHC: 32.7 g/dL (ref 30.0–36.0)
MCV: 93.1 fL (ref 80.0–100.0)
Monocytes Absolute: 0.4 10*3/uL (ref 0.1–1.0)
Monocytes Relative: 4 %
Neutro Abs: 8.4 10*3/uL — ABNORMAL HIGH (ref 1.7–7.7)
Neutrophils Relative %: 89 %
Platelets: 226 10*3/uL (ref 150–400)
RBC: 4.96 MIL/uL (ref 4.22–5.81)
RDW: 13.3 % (ref 11.5–15.5)
WBC: 9.5 10*3/uL (ref 4.0–10.5)
nRBC: 0 % (ref 0.0–0.2)

## 2022-03-12 LAB — COMPREHENSIVE METABOLIC PANEL
ALT: 38 U/L (ref 0–44)
AST: 25 U/L (ref 15–41)
Albumin: 3.3 g/dL — ABNORMAL LOW (ref 3.5–5.0)
Alkaline Phosphatase: 68 U/L (ref 38–126)
Anion gap: 9 (ref 5–15)
BUN: 34 mg/dL — ABNORMAL HIGH (ref 8–23)
CO2: 29 mmol/L (ref 22–32)
Calcium: 8.8 mg/dL — ABNORMAL LOW (ref 8.9–10.3)
Chloride: 105 mmol/L (ref 98–111)
Creatinine, Ser: 0.72 mg/dL (ref 0.61–1.24)
GFR, Estimated: >60 mL/min (ref >60)
Glucose, Bld: 159 mg/dL — ABNORMAL HIGH (ref 70–99)
Potassium: 3.4 mmol/L — ABNORMAL LOW (ref 3.5–5.1)
Sodium: 143 mmol/L (ref 135–145)
Total Bilirubin: 0.9 mg/dL (ref 0.3–1.2)
Total Protein: 5.8 g/dL — ABNORMAL LOW (ref 6.5–8.1)

## 2022-03-12 LAB — D-DIMER, QUANTITATIVE: D-Dimer, Quant: 1.19 ug/mL-FEU — ABNORMAL HIGH (ref 0.00–0.50)

## 2022-03-12 LAB — CULTURE, BLOOD (ROUTINE X 2)
Culture: NO GROWTH
Special Requests: ADEQUATE

## 2022-03-12 LAB — MAGNESIUM: Magnesium: 2.3 mg/dL (ref 1.7–2.4)

## 2022-03-12 LAB — C-REACTIVE PROTEIN: CRP: 1.6 mg/dL — ABNORMAL HIGH (ref <1.0)

## 2022-03-12 MED ORDER — POTASSIUM CHLORIDE CRYS ER 20 MEQ PO TBCR
40.0000 meq | EXTENDED_RELEASE_TABLET | Freq: Once | ORAL | Status: AC
  Administered 2022-03-12: 40 meq via ORAL
  Filled 2022-03-12: qty 2

## 2022-03-12 NOTE — Plan of Care (Signed)
  Problem: Education: Goal: Knowledge of General Education information will improve Description: Including pain rating scale, medication(s)/side effects and non-pharmacologic comfort measures Outcome: Not Progressing   Problem: Clinical Measurements: Goal: Diagnostic test results will improve Outcome: Progressing Goal: Respiratory complications will improve Outcome: Progressing   Problem: Activity: Goal: Risk for activity intolerance will decrease Outcome: Progressing

## 2022-03-12 NOTE — Progress Notes (Signed)
     Referral received for Jeffrey Zimmerman for goals of care discussion. Chart reviewed and updates received from RN. Patient assessed and is unable to engage appropriately in discussions.   Attempted to contact patient's don Toma Copier. Unable to reach again today. Spoke with Dr. Starla Link who requests cont'd attempts at engaging son in [place].  PMT will re-attempt to contact family at a later time/date. Detailed note and recommendations to follow once GOC has been completed.   Thank you for your referral and allowing PMT to assist in [place] Kaseman's care.   Walden Field, NP Palliative Medicine Team Phone: [phone]  NO CHARGE

## 2022-03-12 NOTE — Progress Notes (Signed)
PROGRESS NOTE    Jeffrey Zimmerman  JGO:[id number] DOB: Sep 09, 1935 DOA: 03/07/2022 PCP: [name], MD   Brief Narrative:  86 y.o. male with past medical history significant for advanced Alzheimer's dementia, CAD, history of MI, history of SVT, IBS, history of colon cancer, essential hypertension presented from SNF due to hypoxia and fever.  On presentation, he was febrile, hypoxic.  COVID-19 was positive.  He was started on Decadron and remdesivir.  Assessment & Plan:   Acute respiratory failure secondary to hypoxia COVID-19 pneumonia: Present on admission -Tested positive on 03/07/2022.  Currently still requiring 2 L oxygen via nasal cannula.  Wean off as able. -Monitor inflammatory markers: Improving COVID-19 Labs  Recent Labs    03/10/22 0512 03/11/22 0539 03/12/22 0549  DDIMER 1.67* 1.39* 1.19*  CRP 5.6* 2.9*  --      Lab Results  Component Value Date   SARSCOV2NAA POSITIVE (A) 03/07/2022   [place] NEGATIVE 08/11/2021   [place] NEGATIVE 08/07/2021   [place] NEGATIVE 08/02/2021   -Currently on IV Decadron.  Completed 3 days of IV remdesivir.  Advanced Alzheimer's dementia Adult failure to thrive -Very poor oral intake.  Overall prognosis is very poor.  Currently listed as full code.  Palliative care consulted for goals of care discussion. -Fall precautions. -Patient will possibly return back to SNF. -Follow nutrition recommendations  Hypokalemia--replace.  Repeat a.m. labs  Depression/anxiety -continue Celexa  BPH -Continue finasteride  History of SVT Hypertension -Currently mostly rate controlled.  Home metoprolol discontinued because of bradycardia.  Blood pressure currently stable   DVT prophylaxis: SCDs Code Status: Full Family Communication: None at bedside Disposition Plan: Status is: Inpatient Remains inpatient appropriate because: Of severity of illness.  Very poor oral intake.  Consultants: Palliative care  Procedures:  None  Antimicrobials:  Anti-infectives (From admission, onward)    Start     Dose/Rate Route Frequency Ordered Stop   03/09/22 1000  remdesivir 100 mg in sodium chloride 0.9 % 100 mL IVPB       See Hyperspace for full Linked Orders Report.   100 mg 200 mL/hr over 30 Minutes Intravenous Daily 03/08/22 1239 03/10/22 1130   03/08/22 1600  azithromycin (ZITHROMAX) 500 mg in sodium chloride 0.9 % 250 mL IVPB  Status:  Discontinued        500 mg 250 mL/hr over 60 Minutes Intravenous Every 24 hours 03/07/22 1452 03/09/22 1244   03/08/22 1400  cefTRIAXone (ROCEPHIN) 2 g in sodium chloride 0.9 % 100 mL IVPB  Status:  Discontinued        2 g 200 mL/hr over 30 Minutes Intravenous Every 24 hours 03/07/22 1452 03/09/22 1244   03/08/22 1400  remdesivir 200 mg in sodium chloride 0.9% 250 mL IVPB       See Hyperspace for full Linked Orders Report.   200 mg 580 mL/hr over 30 Minutes Intravenous Once 03/08/22 1239 03/10/22 0021   03/07/22 1545  nirmatrelvir & ritonavir (PAXLOVID) 3 tablet  Status:  Discontinued        3 tablet Oral 2 times daily 03/07/22 1536 03/07/22 1541   03/07/22 1545  nirmatrelvir/ritonavir EUA (PAXLOVID) 3 tablet  Status:  Discontinued        3 tablet Oral 2 times daily 03/07/22 1541 03/08/22 1239   03/07/22 1530  molnupiravir EUA (LAGEVRIO) capsule 800 mg  Status:  Discontinued        4 capsule Oral 2 times daily 03/07/22 1526 03/07/22 1536   03/07/22 1445  nirmatrelvir &  ritonavir (PAXLOVID) 2 tablet  Status:  Discontinued        2 tablet Oral 2 times daily 03/07/22 1443 03/07/22 1526   03/07/22 1415  cefTRIAXone (ROCEPHIN) 1 g in sodium chloride 0.9 % 100 mL IVPB        1 g 200 mL/hr over 30 Minutes Intravenous  Once 03/07/22 1410 03/07/22 1503   03/07/22 1415  azithromycin (ZITHROMAX) 500 mg in sodium chloride 0.9 % 250 mL IVPB        500 mg 250 mL/hr over 60 Minutes Intravenous  Once 03/07/22 1410 03/07/22 1655        Subjective: Patient seen and examined at bedside.   Poor historian, confused.  No seizures, vomiting, agitation or fever reported. Objective: Vitals:   03/11/22 0808 03/11/22 1149 03/11/22 2205 03/12/22 0534  BP: (!) 149/81 (!) 163/83 (!) 167/97 (!) 178/97  Pulse: 67 79 68 65  Resp: '18 18 17 20  '$ Temp: 97.7 F (36.5 C) 98.4 F (36.9 C) 97.7 F (36.5 C) 97.9 F (36.6 C)  TempSrc: Oral Oral Oral   SpO2:  97% 96% 96%  Weight:      Height:        Intake/Output Summary (Last 24 hours) at 03/12/2022 0745 Last data filed at 03/11/2022 1900 Gross per 24 hour  Intake 720 ml  Output 450 ml  Net 270 ml    Filed Weights   03/07/22 1940  Weight: 74.5 kg    Examination:  General: On 2 L oxygen via nasal cannula.  No distress.  Looks chronically ill and deconditioned. ENT/neck: No thyromegaly.  JVD is not elevated  respiratory: Decreased breath sounds at bases bilaterally with some crackles; no wheezing  CVS: S1-S2 heard, rate controlled currently Abdominal: Soft, nontender, slightly distended; no organomegaly, bowel sounds are heard Extremities: Trace lower extremity edema; no cyanosis  CNS: Awake, extremely slow to respond, confused.  No focal neurologic deficit.  Moves extremities Lymph: No obvious lymphadenopathy Skin: No obvious ecchymosis/lesions  psych: Currently not agitated.  Flat affect. musculoskeletal: No obvious joint swelling/deformity    Data Reviewed: I have personally reviewed following labs and imaging studies  CBC: Recent Labs  Lab 03/07/22 1110 03/09/22 0458 03/10/22 0512 03/11/22 0539 03/12/22 0549  WBC 5.5 6.6 8.0 8.0 9.5  NEUTROABS 4.8 6.0 7.3 7.1 8.4*  HGB 12.4* 13.1 14.3 15.6 15.1  HCT 36.8* 40.3 42.2 45.1 46.2  MCV 94.4 94.8 90.8 90.9 93.1  PLT 113* 127* 158 196 532    Basic Metabolic Panel: Recent Labs  Lab 03/07/22 1110 03/09/22 0458 03/10/22 0512 03/11/22 0539 03/12/22 0549  NA 142 145 139 141 143  K 2.9* 3.7 2.9* 3.0* 3.4*  CL 103 109 101 102 105  CO2 '29 28 27 27 29  '$ GLUCOSE  119* 144* 123* 128* 159*  BUN 24* 23 20 26* 34*  CREATININE 0.74 0.70 0.66 0.57* 0.72  CALCIUM 8.5* 8.3* 8.6* 8.5* 8.8*  MG 2.4 2.0 1.9 2.1 2.3  PHOS  --  3.0  --   --   --     GFR: Estimated Creatinine Clearance: 72.4 mL/min (by C-G formula based on SCr of 0.72 mg/dL). Liver Function Tests: Recent Labs  Lab 03/07/22 1110 03/09/22 0458 03/10/22 0512 03/11/22 0539 03/12/22 0549  AST 35 54* 45* 30 25  ALT 27 42 48* 40 38  ALKPHOS 52 52 57 59 68  BILITOT 1.3* 0.8 1.1 1.2 0.9  PROT 6.3* 5.6* 6.0* 6.2* 5.8*  ALBUMIN 3.7 3.0*  3.3* 3.3* 3.3*    No results for input(s): "LIPASE", "AMYLASE" in the last 168 hours. No results for input(s): "AMMONIA" in the last 168 hours. Coagulation Profile: Recent Labs  Lab 03/07/22 1110  INR 1.2    Cardiac Enzymes: No results for input(s): "CKTOTAL", "CKMB", "CKMBINDEX", "TROPONINI" in the last 168 hours. BNP (last 3 results) No results for input(s): "PROBNP" in the last 8760 hours. HbA1C: No results for input(s): "HGBA1C" in the last 72 hours. CBG: No results for input(s): "GLUCAP" in the last 168 hours. Lipid Profile: No results for input(s): "CHOL", "HDL", "LDLCALC", "TRIG", "CHOLHDL", "LDLDIRECT" in the last 72 hours. Thyroid Function Tests: No results for input(s): "TSH", "T4TOTAL", "FREET4", "T3FREE", "THYROIDAB" in the last 72 hours. Anemia Panel: No results for input(s): "VITAMINB12", "FOLATE", "FERRITIN", "TIBC", "IRON", "RETICCTPCT" in the last 72 hours.  Sepsis Labs: Recent Labs  Lab 03/07/22 1110 03/07/22 1434 03/09/22 0458  PROCALCITON <0.10  --  <0.10  LATICACIDVEN 0.9 0.7  --      Recent Results (from the past 240 hour(s))  Blood Culture (routine x 2)     Status: None   Collection Time: 03/07/22 11:10 AM   Specimen: BLOOD  Result Value Ref Range Status   Specimen Description   Final    BLOOD SITE NOT SPECIFIED Performed at [facility] [address], [place], [place] [address]     Special Requests   Final    BOTTLES DRAWN AEROBIC AND ANAEROBIC Blood Culture adequate volume Performed at [facility] [address], [place], [place] [address]    Culture   Final    NO GROWTH 5 DAYS Performed at [facility], [place] [address] [place], [place] [address]    Report Status 03/12/2022 FINAL  Final  Resp Panel by RT-PCR (Flu A&B, Covid) Anterior Nasal Swab     Status: Abnormal   Collection Time: 03/07/22 11:30 AM   Specimen: Anterior Nasal Swab  Result Value Ref Range Status   SARS Coronavirus 2 by RT PCR POSITIVE (A) NEGATIVE Final    Comment: CRITICAL RESULT CALLED TO, READ BACK BY AND VERIFIED WITH: PROGO,A AT 1613 ON 03/07/22 BY LUZOLOP (NOTE) SARS-CoV-2 target nucleic acids are DETECTED.  The SARS-CoV-2 RNA is generally detectable in upper respiratory specimens during the acute phase of infection. Positive results are indicative of the presence of the identified virus, but do not rule out bacterial infection or co-infection with other pathogens not detected by the test. Clinical correlation with patient history and other diagnostic information is necessary to determine patient infection status. The expected result is Negative.  Fact Sheet for Patients: EntrepreneurPulse.com.au  Fact Sheet for Healthcare Providers: IncredibleEmployment.be  This test is not yet approved or cleared by the [place] FDA and  has been authorized for detection and/or diagnosis of SARS-CoV-2 by FDA under an Emergency Use Authorization (EUA).  This EUA will remain in effect (meaning thi s test can be used) for the duration of  the COVID-19 declaration under Section 564(b)(1) of the Act, 21 U.S.C. section 360bbb-3(b)(1), unless the authorization is terminated or revoked sooner.     Influenza A by PCR NEGATIVE NEGATIVE Final   Influenza B by PCR NEGATIVE NEGATIVE Final    Comment: (NOTE) The Xpert Xpress  SARS-CoV-2/FLU/RSV plus assay is intended as an aid in the diagnosis of influenza from Nasopharyngeal swab specimens and should not be used as a sole basis for treatment. Nasal washings and aspirates are unacceptable for Xpert  Xpress SARS-CoV-2/FLU/RSV testing.  Fact Sheet for Patients: EntrepreneurPulse.com.au  Fact Sheet for Healthcare Providers: IncredibleEmployment.be  This test is not yet approved or cleared by the [place] FDA and has been authorized for detection and/or diagnosis of SARS-CoV-2 by FDA under an Emergency Use Authorization (EUA). This EUA will remain in effect (meaning this test can be used) for the duration of the COVID-19 declaration under Section 564(b)(1) of the Act, 21 U.S.C. section 360bbb-3(b)(1), unless the authorization is terminated or revoked.  Performed at [facility], [place] [address] [place], [place] [address]          Radiology Studies: No results found.      Scheduled Meds:  ascorbic acid  500 mg Oral Daily   aspirin EC  81 mg Oral Daily   citalopram  20 mg Oral Daily   dexamethasone (DECADRON) injection  6 mg Intravenous Q24H   feeding supplement  237 mL Oral QID   finasteride  5 mg Oral q morning   multivitamin with minerals  1 tablet Oral Daily   mouth rinse  15 mL Mouth Rinse 4 times per day   rosuvastatin  20 mg Oral Daily   zinc sulfate  220 mg Oral Daily   Continuous Infusions:        Aline August, MD [facility] 03/12/2022, 7:45 AM

## 2022-03-13 LAB — COMPREHENSIVE METABOLIC PANEL
ALT: 41 U/L (ref 0–44)
AST: 28 U/L (ref 15–41)
Albumin: 3.7 g/dL (ref 3.5–5.0)
Alkaline Phosphatase: 75 U/L (ref 38–126)
Anion gap: 10 (ref 5–15)
BUN: 34 mg/dL — ABNORMAL HIGH (ref 8–23)
CO2: 27 mmol/L (ref 22–32)
Calcium: 9 mg/dL (ref 8.9–10.3)
Chloride: 103 mmol/L (ref 98–111)
Creatinine, Ser: 0.63 mg/dL (ref 0.61–1.24)
GFR, Estimated: >60 mL/min (ref >60)
Glucose, Bld: 130 mg/dL — ABNORMAL HIGH (ref 70–99)
Potassium: 3.5 mmol/L (ref 3.5–5.1)
Sodium: 140 mmol/L (ref 135–145)
Total Bilirubin: 1.1 mg/dL (ref 0.3–1.2)
Total Protein: 6.1 g/dL — ABNORMAL LOW (ref 6.5–8.1)

## 2022-03-13 LAB — MAGNESIUM: Magnesium: 2.2 mg/dL (ref 1.7–2.4)

## 2022-03-13 LAB — C-REACTIVE PROTEIN: CRP: 1.2 mg/dL — ABNORMAL HIGH (ref <1.0)

## 2022-03-13 MED ORDER — DEXAMETHASONE 6 MG PO TABS: 6.0000 mg | ORAL_TABLET | Freq: Every day | ORAL | 0 refills | Status: DC

## 2022-03-13 MED ORDER — NYSTATIN 100000 UNIT/GM EX OINT: 1.0000 | TOPICAL_OINTMENT | CUTANEOUS | Status: DC

## 2022-03-13 NOTE — Progress Notes (Signed)
Attempts made x3 to call report to [facility] at number provided. Unable to reach Nurse for report. Placed dc summary and AVS in chart packet. Nachman Sundt, Laurel Dimmer, RN

## 2022-03-13 NOTE — Progress Notes (Signed)
[id number]) Hospital Liaison note:  Notified via [place] from Dr. Aline August of request for [place] services. Will continue to follow for disposition.  Please call with any outpatient palliative questions or concerns.  Thank you for the opportunity to participate in this patient's care.  Thank you, Lorelee Market, LPN [facility] Liaison [phone]

## 2022-03-13 NOTE — Plan of Care (Signed)
  Problem: Education: Goal: Knowledge of General Education information will improve Description: Including pain rating scale, medication(s)/side effects and non-pharmacologic comfort measures Outcome: Progressing   Problem: Activity: Goal: Risk for activity intolerance will decrease Outcome: Progressing   

## 2022-03-13 NOTE — Discharge Summary (Signed)
Physician Discharge Summary  Jeffrey Zimmerman WCH:[id number] DOB: [birth year] DOA: 03/07/2022  PCP: [name], MD  Admit date: 03/07/2022 Discharge date: 03/13/2022  Admitted From: SNF/LTC Disposition: SNF/LTC  Recommendations for Outpatient Follow-up:  Follow up with SNF provider at earliest convenience Recommend outpatient evaluation and follow-up by palliative care for goals of care discussion  follow up in ED if symptoms worsen or new appear   Home Health: No Equipment/Devices: None  Discharge Condition: Guarded to poor CODE STATUS: Full Diet recommendation: Heart healthy  Brief/Interim Summary: 86 y.o. male with past medical history significant for advanced Alzheimer's dementia, CAD, history of MI, history of SVT, IBS, history of colon cancer, essential hypertension presented from SNF due to hypoxia and fever.  On presentation, he was febrile, hypoxic.  COVID-19 was positive.  He was started on Decadron and remdesivir.  During the hospitalization, his respiratory status has remained stable.  He has completed remdesivir and is currently on Decadron.  Palliative care tried to contact his son few times for goals of care discussion without any success.  He remains full code.  Overall prognosis is poor.  He will return back to SNF/LTC today if bed is available.  Discharge Diagnoses:   Acute respiratory failure secondary to hypoxia COVID-19 pneumonia: Present on admission -Tested positive on 03/07/2022.  Currently still requiring 2 L oxygen via nasal cannula.  Wean off as able. -inflammatory markers are improving  COVID-19 Labs  Recent Labs    03/11/22 0539 03/12/22 0549 03/13/22 0516  DDIMER 1.39* 1.19*  --   CRP 2.9* 1.6* 1.2*    Lab Results  Component Value Date   SARSCOV2NAA POSITIVE (A) 03/07/2022   [place] NEGATIVE 08/11/2021   [place] NEGATIVE 08/07/2021   [place] NEGATIVE 08/02/2021      -Currently on IV Decadron.  Completed 3 days of IV  remdesivir. -Discharge back to SNF/LTC today on 4 more days of oral Decadron.  Continue isolation for total of 10 days.   Advanced Alzheimer's dementia Adult failure to thrive -Very poor oral intake.  Overall prognosis is very poor.  Currently listed as full code.Palliative care tried to contact his son few times for goals of care discussion without any success.  He remains full code.  Overall prognosis is poor.  Consider outpatient evaluation and follow-up with palliative care. -Fall precautions. -Follow nutrition recommendations   Hypokalemia--improved  Depression/anxiety -continue Celexa   BPH -Continue finasteride   History of SVT Hypertension -Currently mostly rate controlled.  Home metoprolol discontinued because of bradycardia.  Blood pressure currently stable.  Resume lisinopril.    Discharge Instructions  Discharge Instructions     Amb Referral to Palliative Care   Complete by: As directed    Diet - low sodium heart healthy   Complete by: As directed    Increase activity slowly   Complete by: As directed       Allergies as of 03/13/2022       Reactions   Codeine Other (See Comments)   "Doesn't help with with pain control"        Medication List     STOP taking these medications    amLODipine 5 MG tablet Commonly known as: NORVASC   AZO Cranberry Gummies 250 MG Chew Generic drug: Cranberry   AZO CRANBERRY GUMMIES PO   dutasteride 0.5 MG capsule Commonly known as: AVODART   fluticasone 50 MCG/ACT nasal spray Commonly known as: FLONASE   LORazepam 0.5 MG tablet Commonly known as: ATIVAN   Paxlovid (  150/100) 10 x 150 MG & 10 x '100MG'$  Tbpk Generic drug: nirmatrelvir & ritonavir   polyethylene glycol 17 g packet Commonly known as: MIRALAX / GLYCOLAX   tetrahydrozoline-zinc 0.05-0.25 % ophthalmic solution Commonly known as: VISINE-AC   Vitamin D3 50 MCG (2000 UT) Tabs       TAKE these medications    acetaminophen 650 MG CR  tablet Commonly known as: TYLENOL Take 650 mg by mouth every 8 (eight) hours as needed (osteoarthritis pain).   albuterol 108 (90 Base) MCG/ACT inhaler Commonly known as: VENTOLIN HFA Inhale 2 puffs into the lungs See admin instructions. Inhale 2 puffs every 6-8 hours as needed for COPD   antiseptic oral rinse Liqd 15 mLs by Mouth Rinse route in the morning, at noon, and at bedtime. Before meals. Swish and spit   aspirin EC 81 MG tablet Take 1 tablet (81 mg total) by mouth daily. Swallow whole.   citalopram 20 MG tablet Commonly known as: CELEXA Take 20 mg by mouth daily. What changed: Another medication with the same name was removed. Continue taking this medication, and follow the directions you see here.   dexamethasone 6 MG tablet Commonly known as: DECADRON Take 1 tablet (6 mg total) by mouth daily.   Dextran 70-Hypromellose 0.1-0.3 % Soln Place 1 drop into the right eye every 6 (six) hours as needed (dry eye).   finasteride 5 MG tablet Commonly known as: PROSCAR Take 5 mg by mouth every morning.   lactase 3000 units tablet Commonly known as: LACTAID Take 3,000 Units by mouth 3 (three) times daily with meals.   LACTULOSE PO Take 30 mLs by mouth See admin instructions. Take 30 ml by mouth once daily on Monday, Wednesday, and Friday   lisinopril 20 MG tablet Commonly known as: ZESTRIL Take 1 tablet (20 mg total) by mouth daily.   metoprolol tartrate 25 MG tablet Commonly known as: LOPRESSOR TAKE 1/2 TABLET BY MOUTH TWICE A DAY   nystatin ointment Commonly known as: MYCOSTATIN Apply 1 Application topically See admin instructions. Apply to chest and back every shift on Monday, Wednesday, and Friday for 3 weeks ending on 03/17/22   rosuvastatin 20 MG tablet Commonly known as: CRESTOR Take 1 tablet (20 mg total) by mouth daily.   Selsun Blue 1 % Lotn Generic drug: selenium sulfide Apply 1 application  topically 3 (three) times a week. Apply to scalp/hair on  Monday, Wednesday, and Friday.   Senna Plus 8.6-50 MG Caps Generic drug: Sennosides-Docusate Sodium Take 2 capsules by mouth in the morning and at bedtime. Hold for Diarrhea What changed: Another medication with the same name was removed. Continue taking this medication, and follow the directions you see here.   silodosin 8 MG Caps capsule Commonly known as: RAPAFLO Take 8 mg by mouth daily.   Spiriva Respimat 1.25 MCG/ACT Aers Generic drug: Tiotropium Bromide Monohydrate Inhale 2 each into the lungs daily.   Tears Naturale PM 3-94 % Oint Place 1 Application into the right eye at bedtime.   VISINE RED EYE COMFORT OP Place 2 drops into the right eye in the morning and at bedtime.        Allergies  Allergen Reactions   Codeine Other (See Comments)    "Doesn't help with with pain control"    Consultations: Palliative care.   Procedures/Studies: DG Chest Port 1 View  Result Date: 03/07/2022 CLINICAL DATA:  86 year old male with possible sepsis. Recently positive for COVID-19. EXAM: PORTABLE CHEST 1 VIEW COMPARISON:  Chest radiographs 08/07/2021 and earlier. FINDINGS: Portable AP semi upright view at 1122 hours. Lower lung volumes. Mediastinal contours remain within normal limits. Visualized tracheal air column is within normal limits. Crowding of lung markings at both medial lung bases. Confluent interstitial opacity in the right upper lobe extending from the hilum. No consolidation, pneumothorax, or pleural effusion. No acute osseous abnormality identified. Negative visible bowel gas. IMPRESSION: Low lung volumes with Right Upper Lobe Bronchopneumonia. No pleural effusion. Electronically Signed   By: Genevie Ann M.D.   On: 03/07/2022 11:39      Subjective: Patient seen and examined at bedside.  Awake, poor historian, confused.  Oral intake still poor as per nursing staff.  No overnight fever, vomiting, seizures reported.  Discharge Exam: Vitals:   03/13/22 0408 03/13/22 0427   BP: (!) 182/97 (!) 141/77  Pulse: 70 70  Resp: 19   Temp: 97.6 F (36.4 C)   SpO2: 98%     General: Pt is awake, slow to respond, confused.  On 2 L oxygen via nasal cannula.  Chronically ill looking and deconditioned cardiovascular: rate controlled, S1/S2 + Respiratory: bilateral decreased breath sounds at bases with crackles Abdominal: Soft, NT, ND, bowel sounds + Extremities: Trace extremity edema; no cyanosis    The results of significant diagnostics from this hospitalization (including imaging, microbiology, ancillary and laboratory) are listed below for reference.     Microbiology: Recent Results (from the past 240 hour(s))  Blood Culture (routine x 2)     Status: None   Collection Time: 03/07/22 11:10 AM   Specimen: BLOOD  Result Value Ref Range Status   Specimen Description   Final    BLOOD SITE NOT SPECIFIED Performed at [facility] [address] [place], [place] [address]    Special Requests   Final    BOTTLES DRAWN AEROBIC AND ANAEROBIC Blood Culture adequate volume Performed at [facility] [address] [place], [place] [address]    Culture   Final    NO GROWTH 5 DAYS Performed at [facility], [place] [address], [place], [place] [address]    Report Status 03/12/2022 FINAL  Final  Resp Panel by RT-PCR (Flu A&B, Covid) Anterior Nasal Swab     Status: Abnormal   Collection Time: 03/07/22 11:30 AM   Specimen: Anterior Nasal Swab  Result Value Ref Range Status   SARS Coronavirus 2 by RT PCR POSITIVE (A) NEGATIVE Final    Comment: CRITICAL RESULT CALLED TO, READ BACK BY AND VERIFIED WITH: PROGO,A AT 1613 ON 03/07/22 BY LUZOLOP (NOTE) SARS-CoV-2 target nucleic acids are DETECTED.  The SARS-CoV-2 RNA is generally detectable in upper respiratory specimens during the acute phase of infection. Positive results are indicative of the presence of the identified virus, but do not rule out bacterial infection or  co-infection with other pathogens not detected by the test. Clinical correlation with patient history and other diagnostic information is necessary to determine patient infection status. The expected result is Negative.  Fact Sheet for Patients: EntrepreneurPulse.com.au  Fact Sheet for Healthcare Providers: IncredibleEmployment.be  This test is not yet approved or cleared by the [place] FDA and  has been authorized for detection and/or diagnosis of SARS-CoV-2 by FDA under an Emergency Use Authorization (EUA).  This EUA will remain in effect (meaning thi s test can be used) for the duration of  the COVID-19 declaration under Section 564(b)(1) of the Act, 21 U.S.C. section 360bbb-3(b)(1), unless the authorization is terminated or  revoked sooner.     Influenza A by PCR NEGATIVE NEGATIVE Final   Influenza B by PCR NEGATIVE NEGATIVE Final    Comment: (NOTE) The Xpert Xpress SARS-CoV-2/FLU/RSV plus assay is intended as an aid in the diagnosis of influenza from Nasopharyngeal swab specimens and should not be used as a sole basis for treatment. Nasal washings and aspirates are unacceptable for Xpert Xpress SARS-CoV-2/FLU/RSV testing.  Fact Sheet for Patients: EntrepreneurPulse.com.au  Fact Sheet for Healthcare Providers: IncredibleEmployment.be  This test is not yet approved or cleared by the [place] FDA and has been authorized for detection and/or diagnosis of SARS-CoV-2 by FDA under an Emergency Use Authorization (EUA). This EUA will remain in effect (meaning this test can be used) for the duration of the COVID-19 declaration under Section 564(b)(1) of the Act, 21 U.S.C. section 360bbb-3(b)(1), unless the authorization is terminated or revoked.  Performed at [facility], [place] [address], [place], [place] [address]      Labs: BNP (last 3 results) Recent Labs     03/07/22 1110  BNP 941.7*   Basic Metabolic Panel: Recent Labs  Lab 03/09/22 0458 03/10/22 0512 03/11/22 0539 03/12/22 0549 03/13/22 0516  NA 145 139 141 143 140  K 3.7 2.9* 3.0* 3.4* 3.5  CL 109 101 102 105 103  CO2 '28 27 27 29 27  '$ GLUCOSE 144* 123* 128* 159* 130*  BUN 23 20 26* 34* 34*  CREATININE 0.70 0.66 0.57* 0.72 0.63  CALCIUM 8.3* 8.6* 8.5* 8.8* 9.0  MG 2.0 1.9 2.1 2.3 2.2  PHOS 3.0  --   --   --   --    Liver Function Tests: Recent Labs  Lab 03/09/22 0458 03/10/22 0512 03/11/22 0539 03/12/22 0549 03/13/22 0516  AST 54* 45* '30 25 28  '$ ALT 42 48* 40 38 41  ALKPHOS 52 57 59 68 75  BILITOT 0.8 1.1 1.2 0.9 1.1  PROT 5.6* 6.0* 6.2* 5.8* 6.1*  ALBUMIN 3.0* 3.3* 3.3* 3.3* 3.7   No results for input(s): "LIPASE", "AMYLASE" in the last 168 hours. No results for input(s): "AMMONIA" in the last 168 hours. CBC: Recent Labs  Lab 03/07/22 1110 03/09/22 0458 03/10/22 0512 03/11/22 0539 03/12/22 0549  WBC 5.5 6.6 8.0 8.0 9.5  NEUTROABS 4.8 6.0 7.3 7.1 8.4*  HGB 12.4* 13.1 14.3 15.6 15.1  HCT 36.8* 40.3 42.2 45.1 46.2  MCV 94.4 94.8 90.8 90.9 93.1  PLT 113* 127* 158 196 226   Cardiac Enzymes: No results for input(s): "CKTOTAL", "CKMB", "CKMBINDEX", "TROPONINI" in the last 168 hours. BNP: Invalid input(s): "POCBNP" CBG: No results for input(s): "GLUCAP" in the last 168 hours. D-Dimer Recent Labs    03/11/22 0539 03/12/22 0549  DDIMER 1.39* 1.19*   Hgb A1c No results for input(s): "HGBA1C" in the last 72 hours. Lipid Profile No results for input(s): "CHOL", "HDL", "LDLCALC", "TRIG", "CHOLHDL", "LDLDIRECT" in the last 72 hours. Thyroid function studies No results for input(s): "TSH", "T4TOTAL", "T3FREE", "THYROIDAB" in the last 72 hours.  Invalid input(s): "FREET3" Anemia work up No results for input(s): "VITAMINB12", "FOLATE", "FERRITIN", "TIBC", "IRON", "RETICCTPCT" in the last 72 hours. Urinalysis    Component Value Date/Time   COLORURINE YELLOW  08/07/2021 1715   APPEARANCEUR CLEAR 08/07/2021 1715   LABSPEC >1.030 (H) 08/07/2021 1715   PHURINE 5.5 08/07/2021 1715   GLUCOSEU NEGATIVE 08/07/2021 1715   HGBUR MODERATE (A) 08/07/2021 1715   BILIRUBINUR MODERATE (A) 08/07/2021 1715   KETONESUR 15 (A) 08/07/2021 1715  PROTEINUR 100 (A) 08/07/2021 1715   NITRITE NEGATIVE 08/07/2021 1715   LEUKOCYTESUR TRACE (A) 08/07/2021 1715   Sepsis Labs Recent Labs  Lab 03/09/22 0458 03/10/22 0512 03/11/22 0539 03/12/22 0549  WBC 6.6 8.0 8.0 9.5   Microbiology Recent Results (from the past 240 hour(s))  Blood Culture (routine x 2)     Status: None   Collection Time: 03/07/22 11:10 AM   Specimen: BLOOD  Result Value Ref Range Status   Specimen Description   Final    BLOOD SITE NOT SPECIFIED Performed at [facility] [address], [place], [place] [address]    Special Requests   Final    BOTTLES DRAWN AEROBIC AND ANAEROBIC Blood Culture adequate volume Performed at [facility] [address] [place], [place] [address]    Culture   Final    NO GROWTH 5 DAYS Performed at [facility], [place] [address], [place], [place] [address]    Report Status 03/12/2022 FINAL  Final  Resp Panel by RT-PCR (Flu A&B, Covid) Anterior Nasal Swab     Status: Abnormal   Collection Time: 03/07/22 11:30 AM   Specimen: Anterior Nasal Swab  Result Value Ref Range Status   SARS Coronavirus 2 by RT PCR POSITIVE (A) NEGATIVE Final    Comment: CRITICAL RESULT CALLED TO, READ BACK BY AND VERIFIED WITH: PROGO,A AT 1613 ON 03/07/22 BY LUZOLOP (NOTE) SARS-CoV-2 target nucleic acids are DETECTED.  The SARS-CoV-2 RNA is generally detectable in upper respiratory specimens during the acute phase of infection. Positive results are indicative of the presence of the identified virus, but do not rule out bacterial infection or co-infection with other pathogens not detected by the test. Clinical correlation with patient  history and other diagnostic information is necessary to determine patient infection status. The expected result is Negative.  Fact Sheet for Patients: EntrepreneurPulse.com.au  Fact Sheet for Healthcare Providers: IncredibleEmployment.be  This test is not yet approved or cleared by the [place] FDA and  has been authorized for detection and/or diagnosis of SARS-CoV-2 by FDA under an Emergency Use Authorization (EUA).  This EUA will remain in effect (meaning thi s test can be used) for the duration of  the COVID-19 declaration under Section 564(b)(1) of the Act, 21 U.S.C. section 360bbb-3(b)(1), unless the authorization is terminated or revoked sooner.     Influenza A by PCR NEGATIVE NEGATIVE Final   Influenza B by PCR NEGATIVE NEGATIVE Final    Comment: (NOTE) The Xpert Xpress SARS-CoV-2/FLU/RSV plus assay is intended as an aid in the diagnosis of influenza from Nasopharyngeal swab specimens and should not be used as a sole basis for treatment. Nasal washings and aspirates are unacceptable for Xpert Xpress SARS-CoV-2/FLU/RSV testing.  Fact Sheet for Patients: EntrepreneurPulse.com.au  Fact Sheet for Healthcare Providers: IncredibleEmployment.be  This test is not yet approved or cleared by the [place] FDA and has been authorized for detection and/or diagnosis of SARS-CoV-2 by FDA under an Emergency Use Authorization (EUA). This EUA will remain in effect (meaning this test can be used) for the duration of the COVID-19 declaration under Section 564(b)(1) of the Act, 21 U.S.C. section 360bbb-3(b)(1), unless the authorization is terminated or revoked.  Performed at [facility], [place] [address] [place], [place] [address]      Time coordinating discharge: 35 minutes  SIGNED:   Aline August, MD  [facility] 03/13/2022, 10:45 AM

## 2022-03-13 NOTE — TOC Transition Note (Addendum)
Transition of Care [facility]) - CM/SW Discharge Note   Patient Details  Name: ISAIAHS CHANCY MRN: [id number] Date of Birth: Jun 11, 1936  [facility] CM/SW Contact:  Dessa Phi, RN Phone Number: 03/13/2022, 11:05 AM   Clinical Narrative: d/c back to [facility] aware & accepted. Going to rm#[id number], tel# report #[phone],TAVWP [id number]. [facility] will tranport patient back-Nsg awre to arrange with [facility] rep Marcie Bal.No further CM needs.    -1:50p-TC [facility]-rep Heather-unable to provide transport-they agree to PTAR-PTAR called. Nsg aware forms in printer. No further CM needs.    Final next level of care: Long Term Nursing Home Barriers to Discharge: No Barriers Identified   Patient Goals and CMS Choice Patient states their goals for this hospitalization and ongoing recovery are:: not stated CMS Medicare.gov Compare Post Acute Care list provided to:: Patient Represenative (must comment) (son) Choice offered to / list presented to : Adult Children  Discharge Placement              Patient [facility] bed at: [facility] Patient to be transferred to facility by:  ([facility]) Name of family member notified:  (Nemesio(son) left message) Patient and family notified of of transfer: 03/13/22  Discharge Plan and Services   Discharge Planning Services: CM Consult Post Acute Care Choice: [place]                               Social Determinants of Health (SDOH) Interventions     Readmission Risk Interventions    03/10/2022   10:01 AM 08/04/2021    3:55 PM  Readmission Risk Prevention Plan  Transportation Screening Complete Complete  PCP or Specialist Appt within 3-5 Days Complete   HRI or [place] Complete   Social Work Consult for [place] Planning/Counseling Complete   Palliative Care Screening Not Applicable   Medication Review Press photographer) Complete Complete  PCP or Specialist  appointment within 3-5 days of discharge  Not Complete  PCP/Specialist Appt Not Complete comments  [place] not medically ready for discharge.  [facility] or Home Care Consult  Complete  SW Recovery Care/Counseling Consult  Complete  Palliative Care Screening  Not Applicable  Skilled Nursing Facility  Not Applicable

## 2022-03-13 NOTE — Plan of Care (Signed)
Discharge instructions placed in packet for facility. Problem: Education: Goal: Knowledge of General Education information will improve Description: Including pain rating scale, medication(s)/side effects and non-pharmacologic comfort measures 03/13/2022 1640 by Margarette Canada, RN Outcome: Adequate for Discharge 03/13/2022 1138 by Margarette Canada, RN Outcome: Progressing   Problem: Health Behavior/Discharge Planning: Goal: Ability to manage health-related needs will improve Outcome: Adequate for Discharge   Problem: Clinical Measurements: Goal: Ability to maintain clinical measurements within normal limits will improve Outcome: Adequate for Discharge Goal: Will remain free from infection Outcome: Adequate for Discharge Goal: Diagnostic test results will improve Outcome: Adequate for Discharge Goal: Respiratory complications will improve Outcome: Adequate for Discharge Goal: Cardiovascular complication will be avoided Outcome: Adequate for Discharge   Problem: Pain Managment: Goal: General experience of comfort will improve Outcome: Adequate for Discharge   Problem: Elimination: Goal: Will not experience complications related to bowel motility Outcome: Adequate for Discharge Goal: Will not experience complications related to urinary retention Outcome: Adequate for Discharge   Problem: Coping: Goal: Level of anxiety will decrease Outcome: Adequate for Discharge

## 2022-10-27 ENCOUNTER — Emergency Department (HOSPITAL_COMMUNITY)
Admission: EM | Admit: 2022-10-27 | Discharge: 2022-10-27 | Disposition: A | Discharge status: Skilled Nursing Facility | Payer: Medicare (Managed Care) | Attending: Emergency Medicine | Admitting: Emergency Medicine

## 2022-10-27 ENCOUNTER — Encounter (HOSPITAL_COMMUNITY): Payer: Self-pay | Admitting: Emergency Medicine

## 2022-10-27 ENCOUNTER — Emergency Department (HOSPITAL_COMMUNITY): Payer: Medicare (Managed Care)

## 2022-10-27 ENCOUNTER — Other Ambulatory Visit: Payer: Self-pay

## 2022-10-27 DIAGNOSIS — I10 Essential (primary) hypertension: Secondary | ICD-10-CM | POA: Diagnosis not present

## 2022-10-27 DIAGNOSIS — S0101XA Laceration without foreign body of scalp, initial encounter: Secondary | ICD-10-CM | POA: Diagnosis present

## 2022-10-27 DIAGNOSIS — G309 Alzheimer's disease, unspecified: Secondary | ICD-10-CM | POA: Insufficient documentation

## 2022-10-27 DIAGNOSIS — S0990XA Unspecified injury of head, initial encounter: Secondary | ICD-10-CM | POA: Insufficient documentation

## 2022-10-27 DIAGNOSIS — W01198A Fall on same level from slipping, tripping and stumbling with subsequent striking against other object, initial encounter: Secondary | ICD-10-CM | POA: Insufficient documentation

## 2022-10-27 NOTE — ED Provider Notes (Signed)
[place] [facility]ENCY DEPARTMENT AT [facility] Provider Note   CSN: [id number] Arrival date & time: 10/27/22  1742     History Chief Complaint  Patient presents with   Fall   Laceration    HPI Jeffrey Zimmerman is a 87 y.o. male presenting for fall from the nursing facility.  He states that he got up to go to the bathroom and when he walked out of the bathroom he did not know the wheelchair was behind the door and he lost his balance when reaching for it.  He fell over backwards.  The back of his head has a 0.25 cm laceration at that location. Hemostatic at this time.  Denies blood thinner use. Extensive medical history including hypertension, Alzheimer's disease, SVT in the past, hyperlipidemia per EMS, patient fell backwards at the nursing facility. Otherwise at his mental status baseline per EMS  Patient's recorded medical, surgical, social, medication list and allergies were reviewed in the Snapshot window as part of the initial history.   Review of Systems   Review of Systems  Constitutional:  Negative for chills and fever.  HENT:  Negative for ear pain and sore throat.   Eyes:  Negative for pain and visual disturbance.  Respiratory:  Negative for cough and shortness of breath.   Cardiovascular:  Negative for chest pain and palpitations.  Gastrointestinal:  Negative for abdominal pain and vomiting.  Genitourinary:  Negative for dysuria and hematuria.  Musculoskeletal:  Negative for arthralgias and back pain.  Skin:  Negative for color change and rash.  Neurological:  Negative for seizures and syncope.  All other systems reviewed and are negative.   Physical Exam Updated Vital Signs BP (!) 147/87   Pulse 71   Temp 98.3 F (36.8 C) (Oral)   Resp 16   SpO2 96%  Physical Exam Vitals and nursing note reviewed.  Constitutional:      General: He is not in acute distress.    Appearance: He is well-developed.  HENT:     Head: Normocephalic and atraumatic.   Eyes:     Conjunctiva/sclera: Conjunctivae normal.  Cardiovascular:     Rate and Rhythm: Normal rate and regular rhythm.     Heart sounds: No murmur heard. Pulmonary:     Effort: Pulmonary effort is normal. No respiratory distress.     Breath sounds: Normal breath sounds.  Abdominal:     Palpations: Abdomen is soft.     Tenderness: There is no abdominal tenderness.  Musculoskeletal:        General: No swelling.     Cervical back: Neck supple.  Skin:    General: Skin is warm and dry.     Capillary Refill: Capillary refill takes less than 2 seconds.  Neurological:     Mental Status: He is alert.  Psychiatric:        Mood and Affect: Mood normal.      ED Course/ Medical Decision Making/ A&P    Procedures Procedures   Medications Ordered in ED Medications - No data to display  Medical Decision Making:   Jeffrey RecordsDoyle A Zimmerman is a 87 y.o. male who presented to the ED today with a fall at their living facility detailed above. They are not on a blood thinner. Patient's presentation is complicated by their history of advanced age and multiple comorbid medical problems.  Patient placed on continuous vitals and telemetry monitoring while in ED which was reviewed periodically.   Complete initial physical exam performed, notably  the patient  was hemodynamically stable  in no acute distress. No obvious deformities or injuries appreciated on extensive physical exam including active range of motion of all joints.     Reviewed and confirmed nursing documentation for past medical history, family history, social history.    Initial Assessment/Plan:   This is a patient presenting with a moderate blunt mechanism trauma.  As such, I have considered intracranial injuries including intracranial hemorrhage, intrathoracic injuries including blunt myocardial or blunt lung injury, blunt abdominal injuries including aortic dissection, bladder injury, spleen injury, liver injury and I have considered  orthopedic injuries including extremity or spinal injury. This is most consistent with an acute life/limb threatening illness complicated by underlying chronic conditions.  With the patient's presentation of moderate mechanism trauma but an otherwise reassuring exam, patient warrants targeted evaluation for potential traumatic injuries. Will proceed with targeted evaluation for potential injuries. Will proceed with CT Head, Cervical Spine CT, and Chest/Pelvis XR.  Images reviewed and agree with radiology interpretation.  CT HEAD WO CONTRAST ( )  Result Date: 10/27/2022 CLINICAL DATA:  Head trauma, minor (Age >= 65y); Polytrauma, blunt. Fall. EXAM: CT HEAD WITHOUT CONTRAST CT CERVICAL SPINE WITHOUT CONTRAST TECHNIQUE: Multidetector CT imaging of the head and cervical spine was performed following the standard protocol without intravenous contrast. Multiplanar CT image reconstructions of the cervical spine were also generated. RADIATION DOSE REDUCTION: This exam was performed according to the departmental dose-optimization program which includes automated exposure control, adjustment of the mA and/or kV according to patient size and/or use of iterative reconstruction technique. COMPARISON:  CT head 08/10/2021.  CT neck 12/18/2020. FINDINGS: CT HEAD FINDINGS Brain: There is no evidence of an acute infarct, intracranial hemorrhage, mass, midline shift, or extra-axial fluid collection. Hypodensities in the cerebral white matter have likely mildly progressed and are nonspecific but compatible with mild chronic small vessel ischemic disease. Mild-to-moderate ventriculomegaly is unchanged and favored to reflect central predominant cerebral atrophy over hydrocephalus. Vascular: Calcified atherosclerosis at the skull base. No hyperdense vessel. Skull: No acute fracture or suspicious osseous lesion. Sinuses/Orbits: Visualized paranasal sinuses and mastoid air cells are clear. Visualized portions of the orbits are  unremarkable. Other: Mild right parietal scalp contusion. CT CERVICAL SPINE FINDINGS Alignment: Similar alignment compared to the prior neck CT, including trace anterolisthesis of C3 on C4, trace retrolisthesis of C4 on C5, 4 mm anterolisthesis of C6 on C7, and trace anterolisthesis of C7 on T1. Skull base and vertebrae: No acute fracture or suspicious osseous lesion. Soft tissues and spinal canal: No prevertebral fluid or swelling. No visible canal hematoma. Disc levels: Moderately advanced disc degeneration throughout the mid and lower cervical spine. Widespread facet arthrosis. Baseline capacious cervical spinal canal without evidence of high-grade spinal stenosis. Moderate to severe multilevel neural foraminal stenosis. Upper chest: Clear lung apices. Other: Partial right parotidectomy.  Carotid atherosclerosis. IMPRESSION: 1. No evidence of acute intracranial abnormality. 2. Mild chronic small vessel ischemic disease and moderate cerebral atrophy. 3. Mild right parietal scalp contusion. 4. No acute cervical spine fracture. Electronically Signed   By: Sebastian Ache M.D.   On: 10/27/2022 20:16   CT CERVICAL SPINE WO CONTRAST  Result Date: 10/27/2022 CLINICAL DATA:  Head trauma, minor (Age >= 65y); Polytrauma, blunt. Fall. EXAM: CT HEAD WITHOUT CONTRAST CT CERVICAL SPINE WITHOUT CONTRAST TECHNIQUE: Multidetector CT imaging of the head and cervical spine was performed following the standard protocol without intravenous contrast. Multiplanar CT image reconstructions of the cervical spine were also generated. RADIATION DOSE REDUCTION: This  exam was performed according to the departmental dose-optimization program which includes automated exposure control, adjustment of the mA and/or kV according to patient size and/or use of iterative reconstruction technique. COMPARISON:  CT head 08/10/2021.  CT neck 12/18/2020. FINDINGS: CT HEAD FINDINGS Brain: There is no evidence of an acute infarct, intracranial hemorrhage,  mass, midline shift, or extra-axial fluid collection. Hypodensities in the cerebral white matter have likely mildly progressed and are nonspecific but compatible with mild chronic small vessel ischemic disease. Mild-to-moderate ventriculomegaly is unchanged and favored to reflect central predominant cerebral atrophy over hydrocephalus. Vascular: Calcified atherosclerosis at the skull base. No hyperdense vessel. Skull: No acute fracture or suspicious osseous lesion. Sinuses/Orbits: Visualized paranasal sinuses and mastoid air cells are clear. Visualized portions of the orbits are unremarkable. Other: Mild right parietal scalp contusion. CT CERVICAL SPINE FINDINGS Alignment: Similar alignment compared to the prior neck CT, including trace anterolisthesis of C3 on C4, trace retrolisthesis of C4 on C5, 4 mm anterolisthesis of C6 on C7, and trace anterolisthesis of C7 on T1. Skull base and vertebrae: No acute fracture or suspicious osseous lesion. Soft tissues and spinal canal: No prevertebral fluid or swelling. No visible canal hematoma. Disc levels: Moderately advanced disc degeneration throughout the mid and lower cervical spine. Widespread facet arthrosis. Baseline capacious cervical spinal canal without evidence of high-grade spinal stenosis. Moderate to severe multilevel neural foraminal stenosis. Upper chest: Clear lung apices. Other: Partial right parotidectomy.  Carotid atherosclerosis. IMPRESSION: 1. No evidence of acute intracranial abnormality. 2. Mild chronic small vessel ischemic disease and moderate cerebral atrophy. 3. Mild right parietal scalp contusion. 4. No acute cervical spine fracture. Electronically Signed   By: Sebastian Ache M.D.   On: 10/27/2022 20:16   DG Pelvis Portable  Result Date: 10/27/2022 CLINICAL DATA:  Fall. EXAM: PORTABLE PELVIS 1-2 VIEWS COMPARISON:  Right hip radiographs 08/07/2020 FINDINGS: No acute fracture or pelvic diastasis is identified. There is mild bilateral hip  osteoarthrosis, similar to the prior study. IMPRESSION: No acute osseous abnormality identified. Electronically Signed   By: Sebastian Ache M.D.   On: 10/27/2022 20:07   DG Chest Portable 1 View  Result Date: 10/27/2022 CLINICAL DATA:  Fall. EXAM: PORTABLE CHEST 1 VIEW COMPARISON:  Chest radiograph 03/07/2022 FINDINGS: The cardiomediastinal silhouette is unchanged with normal heart size. Lung volumes are low. Right upper lobe opacity on the prior study has resolved. Minimal bibasilar opacities are compatible with atelectasis. No pulmonary edema, sizable pleural effusion, or pneumothorax is identified. Right upper quadrant abdominal surgical clips are noted. No acute osseous abnormality is seen. IMPRESSION: Low lung volumes without evidence of acute cardiopulmonary disease. Electronically Signed   By: Sebastian Ache M.D.   On: 10/27/2022 20:05    Final Reassessment and Plan:   Patient history present on his physicals and findings not reveal any acute pathology.  I called his son to update him on his plan of care and he feels comfortable with outpatient care and management as well.  Given very small nature of injury, it was irrigated and otherwise bandaged no acute indication for laceration repair or intervention should heal well by secondary closure. States he is being treated for conjunctivitis and patient's son agreed.  Recommend they follow-up with his primary care provider for reassessment of this condition.  Disposition:  I have considered need for hospitalization, however, considering all of the above, I believe this patient is stable for discharge at this time.  Patient/family educated about specific return precautions for given chief complaint and  symptoms.  Patient/family educated about follow-up with PCP.     Patient/family expressed understanding of return precautions and need for follow-up. Patient spoken to regarding all imaging and laboratory results and appropriate follow up for these  results. All education provided in verbal form with additional information in written form. Time was allowed for answering of patient questions. Patient discharged.    Emergency Department Medication Summary:   Medications - No data to display          Clinical Impression:  1. Injury of head, initial encounter      Discharge   Final Clinical Impression(s) / ED Diagnoses Final diagnoses:  Injury of head, initial encounter    Rx / DC Orders ED Discharge Orders     None         Glyn Ade, MD 10/27/22 2032

## 2022-10-27 NOTE — ED Notes (Addendum)
[facility] called for transport back to [facility]. Awaiting transport. Diane, nurse at [facility] called for report.

## 2022-10-27 NOTE — ED Triage Notes (Signed)
The patient presents from [facility] due to an unwitnessed fall today. The patient hit the back of his head. A laceration can be found there. He is not on blood thinners.    HX: dementia, A&O x2   EMS vitals: 144/74 BP 78 HR 98 % SPO2 on room air 18 RR

## 2022-10-27 NOTE — ED Notes (Signed)
[facility] here to take patient back to [facility]. Patient given discharge instructions and follow up care. Patient taken out of ED via stretcher.

## 2022-12-25 ENCOUNTER — Other Ambulatory Visit: Payer: Self-pay

## 2022-12-25 ENCOUNTER — Encounter (HOSPITAL_COMMUNITY): Payer: Self-pay | Admitting: Emergency Medicine

## 2022-12-25 ENCOUNTER — Emergency Department (HOSPITAL_COMMUNITY)
Admission: EM | Admit: 2022-12-25 | Discharge: 2022-12-26 | Disposition: A | Discharge status: Home / Self Care | Payer: Medicare (Managed Care) | Attending: Emergency Medicine | Admitting: Emergency Medicine

## 2022-12-25 DIAGNOSIS — R4182 Altered mental status, unspecified: Secondary | ICD-10-CM | POA: Diagnosis not present

## 2022-12-25 DIAGNOSIS — F03918 Unspecified dementia, unspecified severity, with other behavioral disturbance: Secondary | ICD-10-CM | POA: Insufficient documentation

## 2022-12-25 DIAGNOSIS — R451 Restlessness and agitation: Secondary | ICD-10-CM | POA: Diagnosis present

## 2022-12-25 NOTE — ED Provider Notes (Signed)
[place] EMERGENCY DEPARTMENT AT [facility] Provider Note   CSN: [id number] Arrival date & time: 12/25/22  2222     History {Add pertinent medical, surgical, social history, OB history to HPI:1} Chief Complaint  Patient presents with   Agitation    Jeffrey Zimmerman is a 87 y.o. male.  The history is provided by the patient and medical records.  Jeffrey Zimmerman is a 87 y.o. male who presents to the Emergency Department complaining of agitation.  He presents to the ED by EMS from memory care due to aggressive behavior.  He has a hx/o dementia.   Pt denies any complaints.  He is unhappy that he has been woken.  Level V caveat due to dementia and pt very hard of hearing.      Home Medications Prior to Admission medications   Medication Sig Start Date End Date Taking? Authorizing Provider  acetaminophen (TYLENOL) 650 MG CR tablet Take 650 mg by mouth every 8 (eight) hours as needed (osteoarthritis pain).    [provider]  albuterol (VENTOLIN HFA) 108 (90 Base) MCG/ACT inhaler Inhale 2 puffs into the lungs See admin instructions. Inhale 2 puffs every 6-8 hours as needed for COPD    [provider]  antiseptic oral rinse (BIOTENE) LIQD 15 mLs by Mouth Rinse route in the morning, at noon, and at bedtime. Before meals. Swish and spit    [provider]  citalopram (CELEXA) 20 MG tablet Take 20 mg by mouth daily.    [provider]  dexamethasone (DECADRON) 6 MG tablet Take 1 tablet (6 mg total) by mouth daily. 03/13/22   Glade Lloyd, MD  Dextran 70-Hypromellose 0.1-0.3 % SOLN Place 1 drop into the right eye every 6 (six) hours as needed (dry eye).    [provider]  finasteride (PROSCAR) 5 MG tablet Take 5 mg by mouth every morning.    [provider]  lactase (LACTAID) 3000 units tablet Take 3,000 Units by mouth 3 (three) times daily with meals.    [provider]  LACTULOSE PO Take 30 mLs by mouth See admin  instructions. Take 30 ml by mouth once daily on Monday, Wednesday, and Friday    [provider]  lisinopril (ZESTRIL) 20 MG tablet Take 1 tablet (20 mg total) by mouth daily. 07/11/21   Almon Hercules, MD  nystatin ointment (MYCOSTATIN) Apply 1 Application topically See admin instructions. Apply to chest and back every shift on Monday, Wednesday, and Friday for 3 weeks ending on 03/17/22 03/13/22   Glade Lloyd, MD  rosuvastatin (CRESTOR) 20 MG tablet Take 1 tablet (20 mg total) by mouth daily. 12/31/21   Corky Crafts, MD  selenium sulfide (SELSUN BLUE) 1 % LOTN Apply 1 application  topically 3 (three) times a week. Apply to scalp/hair on Monday, Wednesday, and Friday.    [provider]  Sennosides-Docusate Sodium (SENNA PLUS) 8.6-50 MG CAPS Take 2 capsules by mouth in the morning and at bedtime. Hold for Diarrhea    [provider]  silodosin (RAPAFLO) 8 MG CAPS capsule Take 8 mg by mouth daily.    [provider]  Tetrahydrozoline HCl (VISINE RED EYE COMFORT OP) Place 2 drops into the right eye in the morning and at bedtime.    [provider]  Tiotropium Bromide Monohydrate (SPIRIVA RESPIMAT) 1.25 MCG/ACT AERS Inhale 2 each into the lungs daily.    [provider]  White Petrolatum-Mineral Oil (TEARS NATURALE PM) 3-94 %  OINT Place 1 Application into the right eye at bedtime.    [provider]      Allergies    Codeine    Review of Systems   Review of Systems  All other systems reviewed and are negative.   Physical Exam Updated Vital Signs BP (!) 154/99 (BP Location: Right Arm)   Pulse 82   Temp 98.3 F (36.8 C) (Oral)   Resp 16   Ht 5\' 11"  (1.803 m)   Wt 75 kg   SpO2 97%   BMI 23.06 kg/m  Physical Exam Vitals and nursing note reviewed.  Constitutional:      Appearance: He is well-developed.  HENT:     Head: Normocephalic and atraumatic.     Comments: Scar to right lateral neck and cheek Cardiovascular:      Rate and Rhythm: Normal rate and regular rhythm.     Heart sounds: No murmur heard. Pulmonary:     Effort: Pulmonary effort is normal. No respiratory distress.     Breath sounds: Normal breath sounds.  Abdominal:     Palpations: Abdomen is soft.     Tenderness: There is no abdominal tenderness. There is no guarding or rebound.  Musculoskeletal:        General: No tenderness.  Skin:    General: Skin is warm and dry.  Neurological:     Mental Status: He is alert.     Comments: Extremely hard of hearing.  5/5 strength in all four extremities.  Oriented to hospital.  Disoriented to time and recent events.   Psychiatric:     Comments: Calm but mildly irritated with questioning/exam.       ED Results / Procedures / Treatments   Labs (all labs ordered are listed, but only abnormal results are displayed) Labs Reviewed - No data to display  EKG None  Radiology No results found.  Procedures Procedures  {Document cardiac monitor, telemetry assessment procedure when appropriate:1}  Medications Ordered in ED Medications - No data to display  ED Course/ Medical Decision Making/ A&P   {   Click here for ABCD2, HEART and other calculatorsREFRESH Note before signing :1}                          Medical Decision Making  ***  {Document critical care time when appropriate:1} {Document review of labs and clinical decision tools ie heart score, Chads2Vasc2 etc:1}  {Document your independent review of radiology images, and any outside records:1} {Document your discussion with family members, caretakers, and with consultants:1} {Document social determinants of health affecting pt's care:1} {Document your decision making why or why not admission, treatments were needed:1} Final Clinical Impression(s) / ED Diagnoses Final diagnoses:  None    Rx / DC Orders ED Discharge Orders     None

## 2022-12-25 NOTE — ED Triage Notes (Signed)
Patient BIB [facility] from [facility] for behavioral health evaluation.  Staff report that patient has been aggressive to staff and residents today and claim that patient does not have a history of this.  Patient does have a history of dementia.  Patient has hearing "headphones" with him as he is hard of hearing but patient does not like to wear them.  Patient is pleasant during triage.

## 2022-12-26 ENCOUNTER — Emergency Department (HOSPITAL_COMMUNITY): Payer: Medicare (Managed Care)

## 2022-12-26 LAB — URINALYSIS, W/ REFLEX TO CULTURE (INFECTION SUSPECTED)
Bacteria, UA: NONE SEEN
Bilirubin Urine: NEGATIVE
Glucose, UA: NEGATIVE mg/dL
Hgb urine dipstick: NEGATIVE
Ketones, ur: NEGATIVE mg/dL
Leukocytes,Ua: NEGATIVE
Nitrite: NEGATIVE
Protein, ur: NEGATIVE mg/dL
Specific Gravity, Urine: 1.013 (ref 1.005–1.030)
pH: 6 (ref 5.0–8.0)

## 2022-12-26 LAB — CBC WITH DIFFERENTIAL/PLATELET
Abs Immature Granulocytes: 0.02 10*3/uL (ref 0.00–0.07)
Basophils Absolute: 0.1 10*3/uL (ref 0.0–0.1)
Basophils Relative: 1 %
Eosinophils Absolute: 0.1 10*3/uL (ref 0.0–0.5)
Eosinophils Relative: 2 %
HCT: 41.6 % (ref 39.0–52.0)
Hemoglobin: 14 g/dL (ref 13.0–17.0)
Immature Granulocytes: 0 %
Lymphocytes Relative: 18 %
Lymphs Abs: 1.3 10*3/uL (ref 0.7–4.0)
MCH: 30.2 pg (ref 26.0–34.0)
MCHC: 33.7 g/dL (ref 30.0–36.0)
MCV: 89.8 fL (ref 80.0–100.0)
Monocytes Absolute: 0.6 10*3/uL (ref 0.1–1.0)
Monocytes Relative: 8 %
Neutro Abs: 5.3 10*3/uL (ref 1.7–7.7)
Neutrophils Relative %: 71 %
Platelets: 199 10*3/uL (ref 150–400)
RBC: 4.63 MIL/uL (ref 4.22–5.81)
RDW: 13.2 % (ref 11.5–15.5)
WBC: 7.4 10*3/uL (ref 4.0–10.5)
nRBC: 0 % (ref 0.0–0.2)

## 2022-12-26 LAB — COMPREHENSIVE METABOLIC PANEL
ALT: 12 U/L (ref 0–44)
AST: 19 U/L (ref 15–41)
Albumin: 3.8 g/dL (ref 3.5–5.0)
Alkaline Phosphatase: 88 U/L (ref 38–126)
Anion gap: 11 (ref 5–15)
BUN: 10 mg/dL (ref 8–23)
CO2: 30 mmol/L (ref 22–32)
Calcium: 9.1 mg/dL (ref 8.9–10.3)
Chloride: 99 mmol/L (ref 98–111)
Creatinine, Ser: 0.79 mg/dL (ref 0.61–1.24)
GFR, Estimated: >60 mL/min (ref >60)
Glucose, Bld: 99 mg/dL (ref 70–99)
Potassium: 3 mmol/L — ABNORMAL LOW (ref 3.5–5.1)
Sodium: 140 mmol/L (ref 135–145)
Total Bilirubin: 0.8 mg/dL (ref 0.3–1.2)
Total Protein: 6.1 g/dL — ABNORMAL LOW (ref 6.5–8.1)

## 2022-12-26 MED ORDER — POTASSIUM CHLORIDE 20 MEQ PO PACK
40.0000 meq | PACK | Freq: Once | ORAL | Status: AC
  Administered 2022-12-26: 40 meq via ORAL
  Filled 2022-12-26: qty 2

## 2022-12-26 MED ORDER — LISINOPRIL 20 MG PO TABS
20.0000 mg | ORAL_TABLET | Freq: Once | ORAL | Status: AC
  Administered 2022-12-26: 20 mg via ORAL
  Filled 2022-12-26: qty 1

## 2022-12-26 NOTE — ED Notes (Signed)
Report to [facility], pt from dpt

## 2022-12-26 NOTE — ED Notes (Signed)
Report called to [facility] at [phone], report given to RN Aida, verbalized understanding dc instructions and follow up

## 2022-12-26 NOTE — ED Notes (Signed)
Pt states that he voided and needs to be change, changed pt's depends and bedding and gown, cleaned pt, pt denies pain, d/c pt iv, cath intact.

## 2022-12-26 NOTE — Discharge Instructions (Signed)
Jeffrey Zimmerman  was calm and polite throughout his ED stay.  He did have low potassium on his labs and he was treated with oral potassium.  Please have Jeffrey Zimmerman follow-up with his family doctor regarding medications that may be given as needed if he has aggressive behavior or agitation.  Jeffrey Zimmerman states that he wanted to be left alone earlier because he was not allowed to sleep.

## 2022-12-26 NOTE — ED Notes (Signed)
Patient was given a cup of coffee. 

## 2022-12-26 NOTE — ED Notes (Signed)
x1 attempt to get urine

## 2022-12-26 NOTE — ED Notes (Signed)
[facility] called to transport patient to [facility]

## 2023-01-19 IMAGING — DX DG ABDOMEN ACUTE W/ 1V CHEST
4 series · 4 of 4 positions shown · non-contrast
Comparison: 06/19/2021.

CLINICAL DATA: Abdominal discomfort.

EXAM:
DG ABDOMEN ACUTE WITH 1 VIEW CHEST

[abdomen erect]
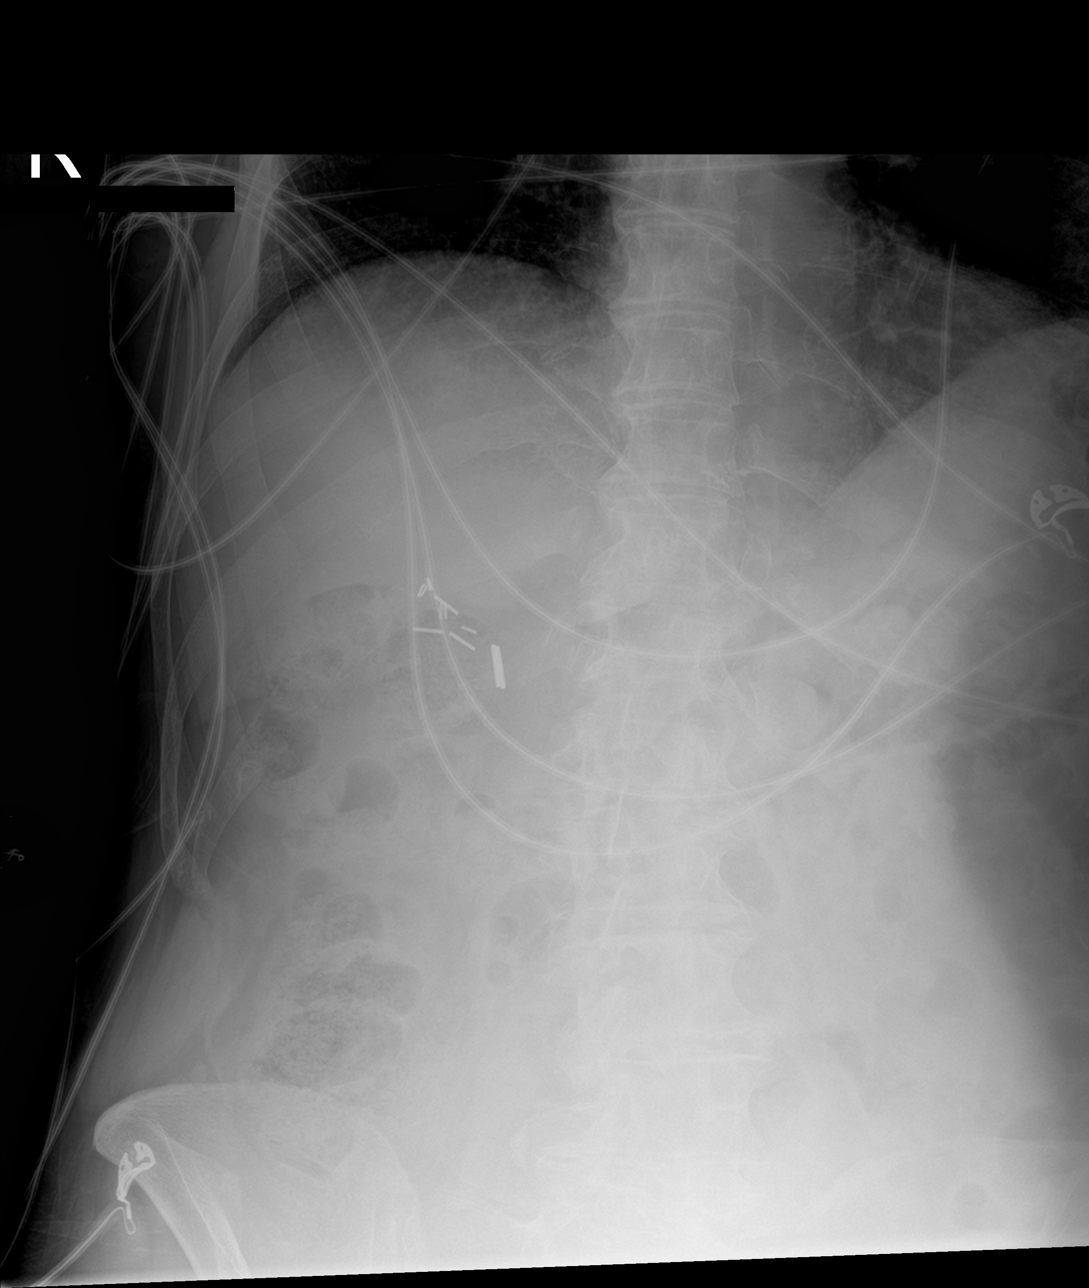

[abdomen supine (1 of 2)]
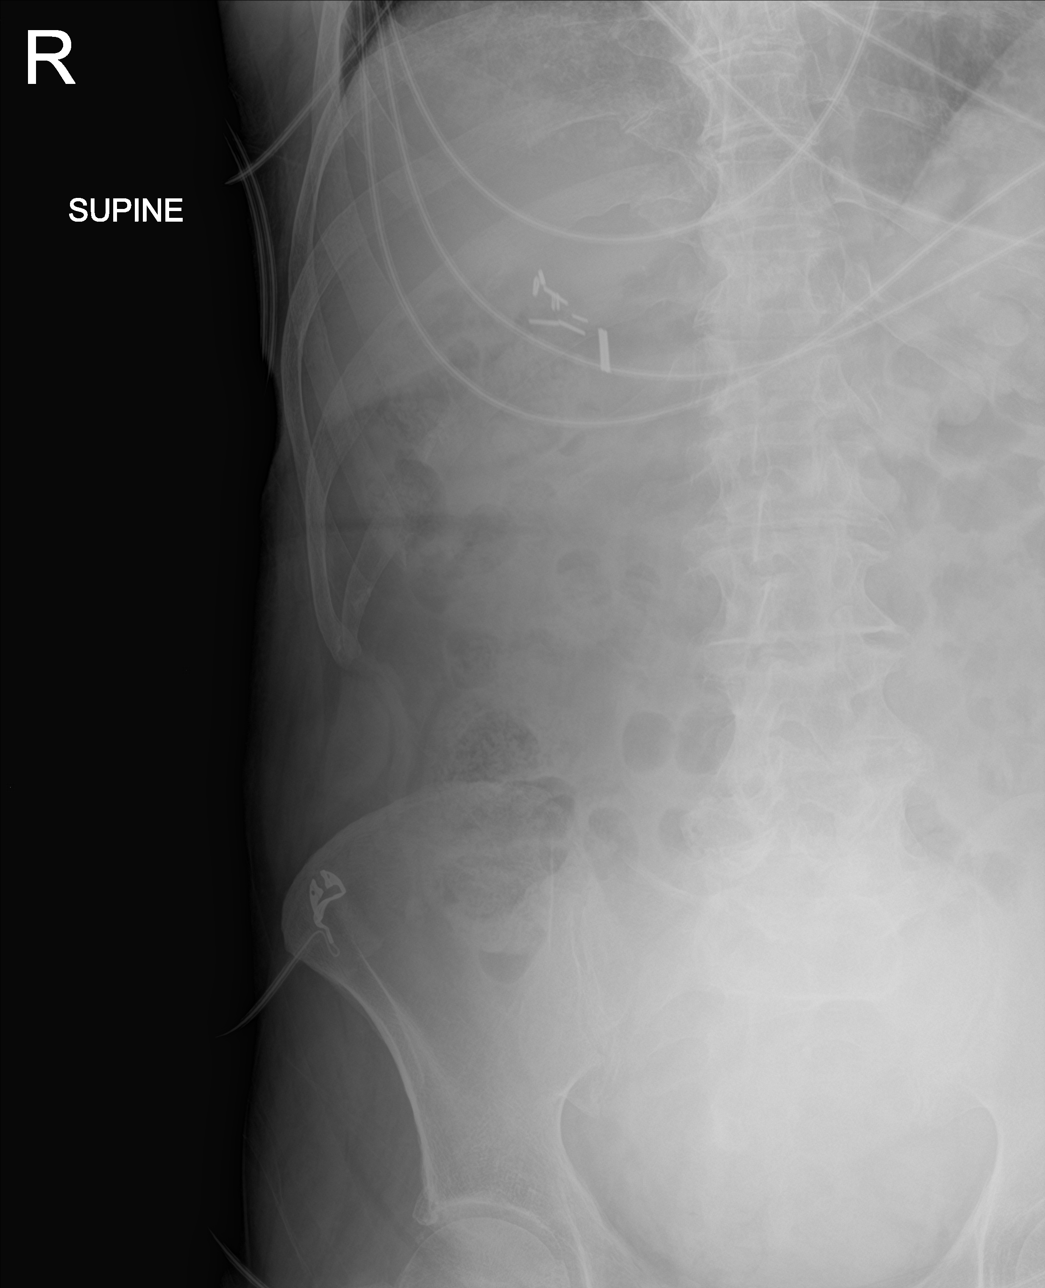

[abdomen supine (2 of 2)]
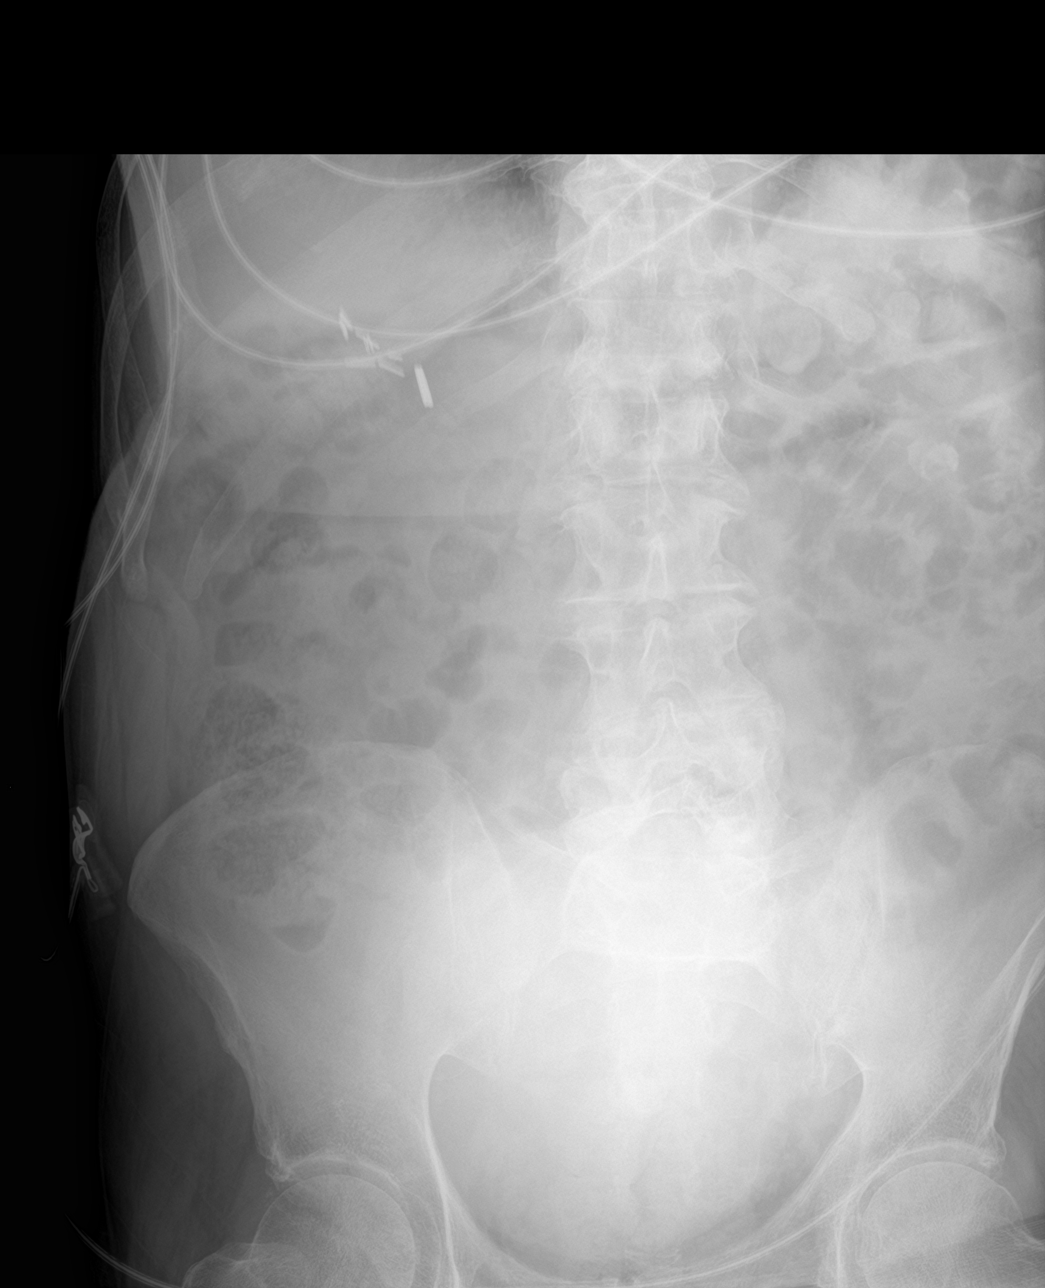

[chest ap]
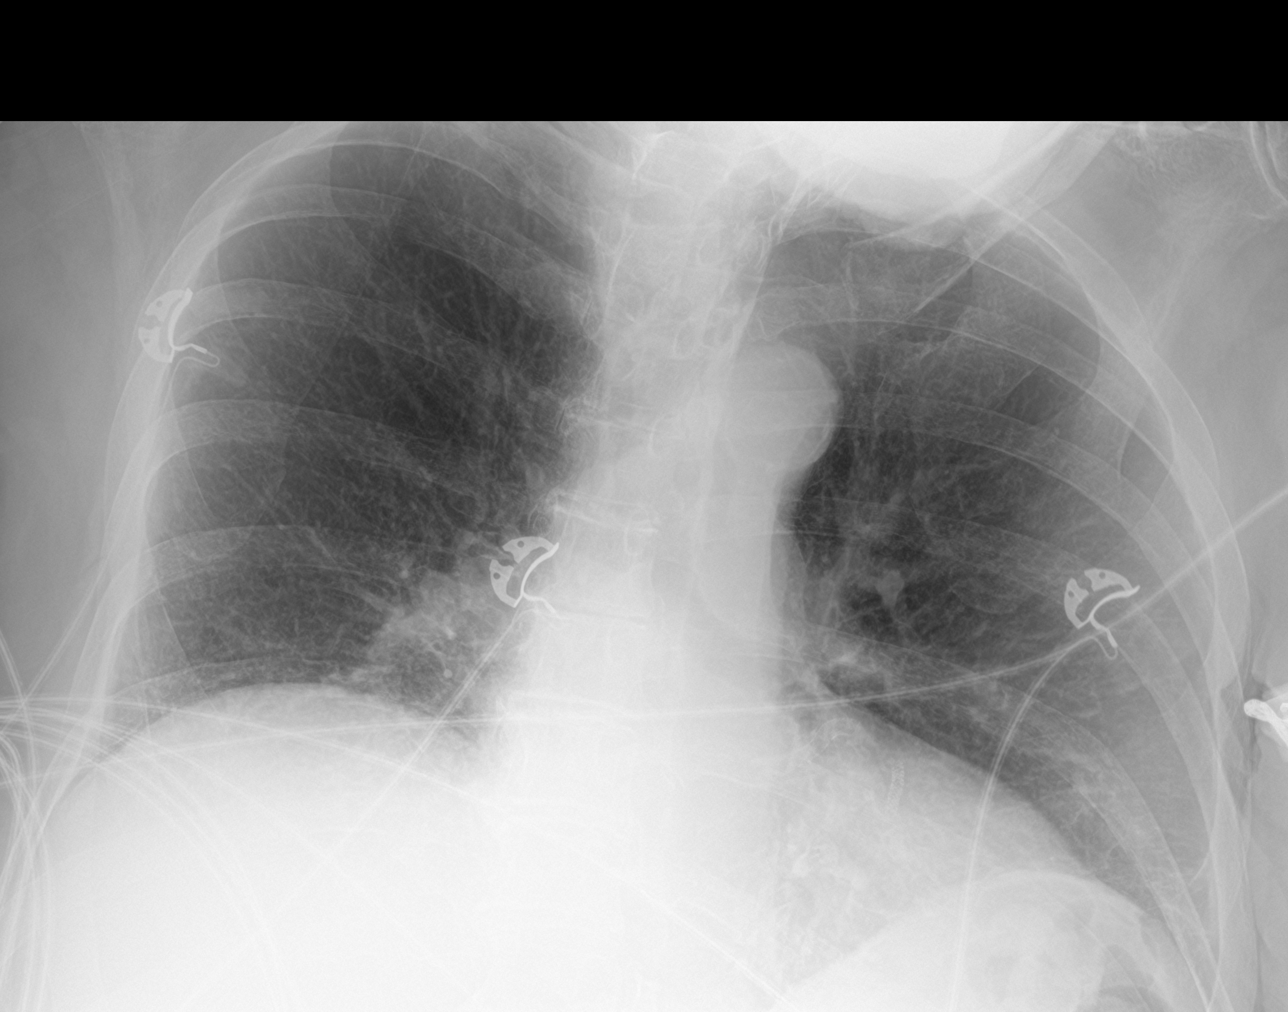

[4 of 4 positions shown; findings below may reference images not displayed]

FINDINGS: There is no evidence of dilated bowel loops or free intraperitoneal
air. A vague calcified lesion is again noted in the upper pole the
left kidney, unchanged from the prior exam. Surgical clips are noted
in the right upper quadrant. Heart size and mediastinal contours are
within normal limits. Atherosclerotic calcification of the aorta is
noted. Both lungs are clear. There are degenerative changes in the
thoracolumbar spine.
IMPRESSION: 1. No acute cardiopulmonary process.
2. No evidence of bowel obstruction or free air.
3. Vague calcified lesion in the upper pole the left kidney,
unchanged from the previous exam.

.

## 2024-01-31 ENCOUNTER — Observation Stay (HOSPITAL_COMMUNITY)
Admission: EM | Admit: 2024-01-31 | Discharge: 2024-02-01 | Disposition: A | Discharge status: Skilled Nursing Facility | Payer: Medicare (Managed Care) | Attending: Internal Medicine | Admitting: Internal Medicine

## 2024-01-31 ENCOUNTER — Encounter (HOSPITAL_COMMUNITY): Payer: Self-pay

## 2024-01-31 ENCOUNTER — Emergency Department (HOSPITAL_COMMUNITY): Payer: Medicare (Managed Care)

## 2024-01-31 DIAGNOSIS — S0101XA Laceration without foreign body of scalp, initial encounter: Secondary | ICD-10-CM | POA: Diagnosis not present

## 2024-01-31 DIAGNOSIS — F02818 Dementia in other diseases classified elsewhere, unspecified severity, with other behavioral disturbance: Secondary | ICD-10-CM | POA: Diagnosis present

## 2024-01-31 DIAGNOSIS — Z23 Encounter for immunization: Secondary | ICD-10-CM | POA: Diagnosis not present

## 2024-01-31 DIAGNOSIS — J449 Chronic obstructive pulmonary disease, unspecified: Secondary | ICD-10-CM | POA: Diagnosis present

## 2024-01-31 DIAGNOSIS — G309 Alzheimer's disease, unspecified: Secondary | ICD-10-CM | POA: Insufficient documentation

## 2024-01-31 DIAGNOSIS — W01198A Fall on same level from slipping, tripping and stumbling with subsequent striking against other object, initial encounter: Secondary | ICD-10-CM | POA: Diagnosis not present

## 2024-01-31 DIAGNOSIS — I10 Essential (primary) hypertension: Secondary | ICD-10-CM | POA: Diagnosis not present

## 2024-01-31 DIAGNOSIS — S06360A Traumatic hemorrhage of cerebrum, unspecified, without loss of consciousness, initial encounter: Principal | ICD-10-CM | POA: Insufficient documentation

## 2024-01-31 DIAGNOSIS — I629 Nontraumatic intracranial hemorrhage, unspecified: Principal | ICD-10-CM

## 2024-01-31 DIAGNOSIS — Z85038 Personal history of other malignant neoplasm of large intestine: Secondary | ICD-10-CM | POA: Diagnosis not present

## 2024-01-31 DIAGNOSIS — I2510 Atherosclerotic heart disease of native coronary artery without angina pectoris: Secondary | ICD-10-CM | POA: Insufficient documentation

## 2024-01-31 DIAGNOSIS — I615 Nontraumatic intracerebral hemorrhage, intraventricular: Secondary | ICD-10-CM | POA: Diagnosis not present

## 2024-01-31 DIAGNOSIS — Z79899 Other long term (current) drug therapy: Secondary | ICD-10-CM | POA: Diagnosis not present

## 2024-01-31 DIAGNOSIS — S0990XA Unspecified injury of head, initial encounter: Secondary | ICD-10-CM | POA: Diagnosis present

## 2024-01-31 MED ORDER — TETANUS-DIPHTH-ACELL PERTUSSIS 5-2.5-18.5 LF-MCG/0.5 IM SUSY
0.5000 mL | PREFILLED_SYRINGE | Freq: Once | INTRAMUSCULAR | Status: AC
  Administered 2024-02-01: 0.5 mL via INTRAMUSCULAR
  Filled 2024-01-31: qty 0.5

## 2024-01-31 NOTE — ED Provider Notes (Signed)
 [place] [facility] AT [facility] Provider Note   CSN: [id number] Arrival date & time: 01/31/24  1806     Patient presents with: Fall and Head Laceration   Jeffrey Zimmerman is a 88 y.o. male.    Fall Pertinent negatives include no chest pain, no abdominal pain and no shortness of breath.  Head Laceration Pertinent negatives include no chest pain, no abdominal pain and no shortness of breath.   Presents because of fall.  Head laceration.  Patient states he tripped today and subsequently hit the back of his head.  He is not clear whether or not he takes anticoagulation.  Endorses a headache.  No neck pain.  No back pain.  No bowel or bladder incontinence.  Patient does not think he lost consciousness.  No chest pain or shortness of breath.  No nausea vomiting diarrhea.   Previous medical history reviewed : Patient was las seen in the ED on 12/25/22 due to agitation.      Prior to Admission medications   Medication Sig Start Date End Date Taking? Authorizing Provider  acetaminophen  (TYLENOL ) 650 MG CR tablet Take 650 mg by mouth every 8 (eight) hours as needed (osteoarthritis pain).    [provider]  albuterol  (VENTOLIN  HFA) 108 (90 Base) MCG/ACT inhaler Inhale 2 puffs into the lungs See admin instructions. Inhale 2 puffs every 6-8 hours as needed for COPD    [provider]  antiseptic oral rinse (BIOTENE) LIQD 15 mLs by Mouth Rinse route in the morning, at noon, and at bedtime. Before meals. Swish and spit    [provider]  citalopram  (CELEXA ) 20 MG tablet Take 20 mg by mouth daily.    [provider]  dexamethasone  (DECADRON ) 6 MG tablet Take 1 tablet (6 mg total) by mouth daily. 03/13/22   Cheryle Page, MD  Dextran 70-Hypromellose 0.1-0.3 % SOLN Place 1 drop into the right eye every 6 (six) hours as needed (dry eye).    [provider]  finasteride  (PROSCAR ) 5 MG tablet Take 5 mg by mouth every morning.     [provider]  lactase (LACTAID) 3000 units tablet Take 3,000 Units by mouth 3 (three) times daily with meals.    [provider]  LACTULOSE PO Take 30 mLs by mouth See admin instructions. Take 30 ml by mouth once daily on Monday, Wednesday, and Friday    [provider]  lisinopril  (ZESTRIL ) 20 MG tablet Take 1 tablet (20 mg total) by mouth daily. 07/11/21   Gonfa, Taye T, MD  nystatin  ointment (MYCOSTATIN ) Apply 1 Application topically See admin instructions. Apply to chest and back every shift on Monday, Wednesday, and Friday for 3 weeks ending on 03/17/22 03/13/22   Cheryle Page, MD  rosuvastatin  (CRESTOR ) 20 MG tablet Take 1 tablet (20 mg total) by mouth daily. 12/31/21   Dann Candyce RAMAN, MD  selenium sulfide (SELSUN BLUE) 1 % LOTN Apply 1 application  topically 3 (three) times a week. Apply to scalp/hair on Monday, Wednesday, and Friday.    [provider]  Sennosides-Docusate Sodium  (SENNA PLUS) 8.6-50 MG CAPS Take 2 capsules by mouth in the morning and at bedtime. Hold for Diarrhea    [provider]  silodosin (RAPAFLO) 8 MG CAPS capsule Take 8 mg by mouth daily.    [provider]  Tetrahydrozoline HCl (VISINE RED EYE COMFORT OP) Place 2 drops into the right eye in the morning and at bedtime.  [provider]  Tiotropium Bromide  Monohydrate (SPIRIVA  RESPIMAT) 1.25 MCG/ACT AERS Inhale 2 each into the lungs daily.    [provider]  White Petrolatum -Mineral Oil (TEARS NATURALE PM) 3-94 % OINT Place 1 Application into the right eye at bedtime.    [provider]    Allergies: Codeine    Review of Systems  Constitutional:  Negative for chills and fever.  HENT:  Negative for ear pain and sore throat.   Eyes:  Negative for pain and visual disturbance.  Respiratory:  Negative for cough and shortness of breath.   Cardiovascular:  Negative for chest pain and palpitations.  Gastrointestinal:  Negative for  abdominal pain and vomiting.  Genitourinary:  Negative for dysuria and hematuria.  Musculoskeletal:  Negative for arthralgias and back pain.  Skin:  Negative for color change and rash.  Neurological:  Negative for seizures and syncope.  All other systems reviewed and are negative.   Updated Vital Signs BP (!) 155/80 (BP Location: Right Arm)   Pulse 66   Temp 98.5 F (36.9 C) (Oral)   Resp 17   Ht 5' 11 (1.803 m)   Wt 74.8 kg   SpO2 95%   BMI 23.01 kg/m   Physical Exam Vitals and nursing note reviewed.  Constitutional:      General: He is not in acute distress.    Appearance: He is well-developed.  HENT:     Head: Normocephalic and atraumatic.   Eyes:     Conjunctiva/sclera: Conjunctivae normal.  Cardiovascular:     Rate and Rhythm: Normal rate and regular rhythm.     Heart sounds: No murmur heard. Pulmonary:     Effort: Pulmonary effort is normal. No respiratory distress.     Breath sounds: Normal breath sounds.  Abdominal:     Palpations: Abdomen is soft.     Tenderness: There is no abdominal tenderness.  Musculoskeletal:        General: No swelling.     Cervical back: Neck supple.  Skin:    General: Skin is warm and dry.     Capillary Refill: Capillary refill takes less than 2 seconds.  Neurological:     Mental Status: He is alert.  Psychiatric:        Mood and Affect: Mood normal.     (all labs ordered are listed, but only abnormal results are displayed) Labs Reviewed  COMPREHENSIVE METABOLIC PANEL WITH GFR  CBC WITH DIFFERENTIAL/PLATELET  PROTIME-INR    EKG: None  Radiology: CT Head Wo Contrast Result Date: 01/31/2024 CLINICAL DATA:  Recent fall from wheelchair with headaches and neck pain, initial encounter EXAM: CT HEAD WITHOUT CONTRAST TECHNIQUE: Contiguous axial images were obtained from the base of the skull through the vertex without intravenous contrast. RADIATION DOSE REDUCTION: This exam was performed according to the departmental  dose-optimization program which includes automated exposure control, adjustment of the mA and/or kV according to patient size and/or use of iterative reconstruction technique. COMPARISON:  12/26/2022 FINDINGS: Brain: Ventricles are again prominent in size. Mild atrophic changes are noted. A minimal amount of increased density is noted in the dependent portion of the occipital horn of the left lateral ventricle. This is best visualized on image number 20 of series 504. This is consistent with mild interventricular hemorrhage although no parenchymal or subarachnoid hemorrhage is identified. No mass lesion is noted. No acute infarct is seen. Vascular: No hyperdense vessel or unexpected calcification. Skull: Normal. Negative for fracture or focal lesion. Sinuses/Orbits: No acute  finding. Other: None. IMPRESSION: Minimal isolated intraventricular hemorrhage in the left lateral ventricle as described. No associated hemorrhage is noted. Follow-up examination is recommended. Critical Value/emergent results were called by telephone at the time of interpretation on 01/31/2024 at 10:31 pm to Dr. LAVONIA PAT , who verbally acknowledged these results. Electronically Signed   By: Oneil Devonshire M.D.   On: 01/31/2024 22:33   CT Cervical Spine Wo Contrast Result Date: 01/31/2024 CLINICAL DATA:  Fall, evaluation for fracture. Posterior head laceration. EXAM: CT CERVICAL SPINE WITHOUT CONTRAST TECHNIQUE: Multidetector CT imaging of the cervical spine was performed without intravenous contrast. Multiplanar CT image reconstructions were also generated. RADIATION DOSE REDUCTION: This exam was performed according to the departmental dose-optimization program which includes automated exposure control, adjustment of the mA and/or kV according to patient size and/or use of iterative reconstruction technique. COMPARISON:  CT 10/27/2022 FINDINGS: Alignment: No evidence of traumatic listhesis. Skull base and vertebrae: No acute fracture. Soft  tissues and spinal canal: No prevertebral fluid or swelling. No visible canal hematoma. Disc levels: Moderate to advanced spondylosis and disc space height loss throughout the cervical spine similar to 10/27/2022. Widespread facet arthrosis. No severe spinal canal narrowing. Upper chest: No acute abnormality. Other: Carotid calcification. IMPRESSION: No acute fracture in the cervical spine Electronically Signed   By: Norman Gatlin M.D.   On: 01/31/2024 22:27     .Laceration Repair  Date/Time: 01/31/2024 11:08 PM  Performed by: PAT LAVONIA SAILOR, MD Authorized by: PAT LAVONIA SAILOR, MD   Consent:    Consent obtained:  Verbal   Risks discussed:  Infection, pain, retained foreign body, tendon damage, vascular damage, poor wound healing, poor cosmetic result, need for additional repair and nerve damage   Alternatives discussed:  No treatment Anesthesia:    Anesthesia method:  None Laceration details:    Location:  Scalp   Length (cm):  1.5 Pre-procedure details:    Preparation:  Patient was prepped and draped in usual sterile fashion Exploration:    Hemostasis achieved with:  Direct pressure Treatment:    Area cleansed with:  Povidone-iodine and saline   Amount of cleaning:  Standard   Irrigation solution:  Sterile saline   Irrigation volume:  500   Irrigation method:  Pressure wash   Debridement:  None   Undermining:  None   Scar revision: no   Skin repair:    Repair method:  Staples   Number of staples:  1 Approximation:    Approximation:  Close Repair type:    Repair type:  Simple Post-procedure details:    Dressing:  Open (no dressing)    Medications Ordered in the ED  Tdap (BOOSTRIX) injection 0.5 mL (has no administration in time range)                                    Medical Decision Making Amount and/or Complexity of Data Reviewed Labs: ordered. Radiology: ordered.  Risk Prescription drug management.   Presents because of fall.  Head laceration.  Patient  states he tripped today and subsequently hit the back of his head.  He is not clear whether or not he takes anticoagulation.  Endorses a headache.  No neck pain.  No back pain.  No bowel or bladder incontinence.  Patient does not think he lost consciousness.  No chest pain or shortness of breath.  No nausea vomiting diarrhea.   Previous medical history reviewed :  Patient was las seen in the ED on 12/25/22 due to agitation.    Upon exam, patient hemodynamically stable.  Alert to self and place.  This is his baseline.  No acute focal deficits.  No concerns for CVA.   Given fall and head strike, did obtain CT head as well as CT cervical spine.  CT cervical spine did not show any kind of acute pathology.  CT head did show small intraventricular bleed.  Reviewed patient's chart, no history of anticoagulation that I can see.  Try to call patient's facility where he is at to inform that he is not on anticoagulation.  Unable to reach the facility despite calling multiple times.  No anticoagulation seen through [facility]where as well.  Spoke to neurosurgery.  Recommended repeat head CT at 5 AM.  Otherwise observed.   Patient did have a small laceration to the back of his head.  Cleaned.  Tdap updated.  Placed 1 staple.  He will need to have this removed outpatient.       Final diagnoses:  Intracranial bleed (HCC)  Laceration of scalp without foreign body, initial encounter    ED Discharge Orders     None          Simon Lavonia SAILOR, MD 01/31/24 2310

## 2024-01-31 NOTE — ED Triage Notes (Addendum)
 Per EMS, Pt, from [facility], presents d/t a posterior head laceration after falling out of his wheelchair.  Denies blood thinners and neck/back pain.  Pt is A&Ox4.    Pt reports I'm clumsy. I tripped over my feet and fell.

## 2024-02-01 ENCOUNTER — Other Ambulatory Visit: Payer: Self-pay

## 2024-02-01 ENCOUNTER — Observation Stay (HOSPITAL_COMMUNITY): Payer: Medicare (Managed Care)

## 2024-02-01 ENCOUNTER — Encounter (HOSPITAL_COMMUNITY): Payer: Self-pay | Admitting: Family Medicine

## 2024-02-01 DIAGNOSIS — J449 Chronic obstructive pulmonary disease, unspecified: Secondary | ICD-10-CM | POA: Diagnosis present

## 2024-02-01 DIAGNOSIS — I629 Nontraumatic intracranial hemorrhage, unspecified: Secondary | ICD-10-CM

## 2024-02-01 DIAGNOSIS — S0101XA Laceration without foreign body of scalp, initial encounter: Secondary | ICD-10-CM

## 2024-02-01 DIAGNOSIS — I615 Nontraumatic intracerebral hemorrhage, intraventricular: Secondary | ICD-10-CM | POA: Diagnosis not present

## 2024-02-01 DIAGNOSIS — G309 Alzheimer's disease, unspecified: Secondary | ICD-10-CM | POA: Diagnosis not present

## 2024-02-01 LAB — PROTIME-INR
INR: 1 (ref 0.8–1.2)
Prothrombin Time: 13.9 s (ref 11.4–15.2)

## 2024-02-01 LAB — COMPREHENSIVE METABOLIC PANEL WITH GFR
ALT: 20 U/L (ref 0–44)
AST: 31 U/L (ref 15–41)
Albumin: 4 g/dL (ref 3.5–5.0)
Alkaline Phosphatase: 77 U/L (ref 38–126)
Anion gap: 13 (ref 5–15)
BUN: 13 mg/dL (ref 8–23)
CO2: 28 mmol/L (ref 22–32)
Calcium: 9.2 mg/dL (ref 8.9–10.3)
Chloride: 97 mmol/L — ABNORMAL LOW (ref 98–111)
Creatinine, Ser: 0.97 mg/dL (ref 0.61–1.24)
GFR, Estimated: >60 mL/min (ref >60)
Glucose, Bld: 107 mg/dL — ABNORMAL HIGH (ref 70–99)
Potassium: 3.9 mmol/L (ref 3.5–5.1)
Sodium: 138 mmol/L (ref 135–145)
Total Bilirubin: 1.2 mg/dL (ref 0.0–1.2)
Total Protein: 7 g/dL (ref 6.5–8.1)

## 2024-02-01 LAB — CBC WITH DIFFERENTIAL/PLATELET
Abs Immature Granulocytes: 0.03 K/uL (ref 0.00–0.07)
Basophils Absolute: 0 K/uL (ref 0.0–0.1)
Basophils Relative: 1 %
Eosinophils Absolute: 0.2 K/uL (ref 0.0–0.5)
Eosinophils Relative: 2 %
HCT: 45.5 % (ref 39.0–52.0)
Hemoglobin: 15.2 g/dL (ref 13.0–17.0)
Immature Granulocytes: 0 %
Lymphocytes Relative: 23 %
Lymphs Abs: 1.6 K/uL (ref 0.7–4.0)
MCH: 30.3 pg (ref 26.0–34.0)
MCHC: 33.4 g/dL (ref 30.0–36.0)
MCV: 90.8 fL (ref 80.0–100.0)
Monocytes Absolute: 0.5 K/uL (ref 0.1–1.0)
Monocytes Relative: 7 %
Neutro Abs: 4.8 K/uL (ref 1.7–7.7)
Neutrophils Relative %: 67 %
Platelets: 144 K/uL — ABNORMAL LOW (ref 150–400)
RBC: 5.01 MIL/uL (ref 4.22–5.81)
RDW: 13.8 % (ref 11.5–15.5)
WBC: 7.1 K/uL (ref 4.0–10.5)
nRBC: 0 % (ref 0.0–0.2)

## 2024-02-01 MED ORDER — OXYCODONE HCL 5 MG PO TABS
2.5000 mg | ORAL_TABLET | ORAL | Status: DC | PRN
  Administered 2024-02-01: 2.5 mg via ORAL
  Filled 2024-02-01: qty 1

## 2024-02-01 MED ORDER — ALBUTEROL SULFATE (2.5 MG/3ML) 0.083% IN NEBU: 2.5000 mg | INHALATION_SOLUTION | Freq: Four times a day (QID) | RESPIRATORY_TRACT | Status: DC | PRN

## 2024-02-01 MED ORDER — HALOPERIDOL LACTATE 5 MG/ML IJ SOLN
5.0000 mg | Freq: Once | INTRAMUSCULAR | Status: AC | PRN
  Administered 2024-02-01: 5 mg via INTRAMUSCULAR
  Filled 2024-02-01: qty 1

## 2024-02-01 MED ORDER — CITALOPRAM HYDROBROMIDE 20 MG PO TABS
20.0000 mg | ORAL_TABLET | Freq: Every day | ORAL | Status: DC
  Administered 2024-02-01: 20 mg via ORAL
  Filled 2024-02-01: qty 1

## 2024-02-01 MED ORDER — SENNOSIDES-DOCUSATE SODIUM 8.6-50 MG PO TABS
1.0000 | ORAL_TABLET | Freq: Every evening | ORAL | Status: DC | PRN
Start: 2024-02-01 — End: 2024-02-01

## 2024-02-01 MED ORDER — HALOPERIDOL LACTATE 5 MG/ML IJ SOLN
2.0000 mg | Freq: Once | INTRAMUSCULAR | Status: DC
  Filled 2024-02-01: qty 1

## 2024-02-01 MED ORDER — ALBUTEROL SULFATE HFA 108 (90 BASE) MCG/ACT IN AERS: 2.0000 | INHALATION_SPRAY | Freq: Four times a day (QID) | RESPIRATORY_TRACT | Status: DC | PRN

## 2024-02-01 MED ORDER — ACETAMINOPHEN 325 MG PO TABS: 650.0000 mg | ORAL_TABLET | Freq: Four times a day (QID) | ORAL | Status: DC | PRN

## 2024-02-01 MED ORDER — ONDANSETRON HCL 4 MG PO TABS: 4.0000 mg | ORAL_TABLET | Freq: Four times a day (QID) | ORAL | Status: DC | PRN

## 2024-02-01 MED ORDER — ONDANSETRON HCL 4 MG/2ML IJ SOLN: 4.0000 mg | Freq: Four times a day (QID) | INTRAMUSCULAR | Status: DC | PRN

## 2024-02-01 MED ORDER — HALOPERIDOL LACTATE 5 MG/ML IJ SOLN
2.0000 mg | Freq: Once | INTRAMUSCULAR | Status: AC
  Administered 2024-02-01: 2 mg via INTRAMUSCULAR
  Filled 2024-02-01: qty 1

## 2024-02-01 MED ORDER — CITALOPRAM HYDROBROMIDE 20 MG PO TABS: 40.0000 mg | ORAL_TABLET | Freq: Every day | ORAL | Status: DC

## 2024-02-01 MED ORDER — SODIUM CHLORIDE 0.9% FLUSH
3.0000 mL | Freq: Two times a day (BID) | INTRAVENOUS | Status: DC
  Administered 2024-02-01: 3 mL via INTRAVENOUS

## 2024-02-01 MED ORDER — CITALOPRAM HYDROBROMIDE 10 MG PO TABS: 40.0000 mg | ORAL_TABLET | Freq: Every day | ORAL | Status: DC

## 2024-02-01 MED ORDER — DIVALPROEX SODIUM 125 MG PO DR TAB
125.0000 mg | DELAYED_RELEASE_TABLET | Freq: Two times a day (BID) | ORAL | Status: DC
  Administered 2024-02-01: 125 mg via ORAL
  Filled 2024-02-01 (×3): qty 1

## 2024-02-01 MED ORDER — ACETAMINOPHEN 650 MG RE SUPP: 650.0000 mg | Freq: Four times a day (QID) | RECTAL | Status: DC | PRN

## 2024-02-01 MED ORDER — AMLODIPINE BESYLATE 10 MG PO TABS
5.0000 mg | ORAL_TABLET | Freq: Every day | ORAL | Status: DC
  Administered 2024-02-01: 5 mg via ORAL
  Filled 2024-02-01: qty 1

## 2024-02-01 MED ORDER — FENTANYL CITRATE PF 50 MCG/ML IJ SOSY: 12.5000 ug | PREFILLED_SYRINGE | INTRAMUSCULAR | Status: DC | PRN

## 2024-02-01 NOTE — Progress Notes (Signed)
 Patient received discharge orders to go back to [facility]. RN called report to [facility]. Discharge instructions/paperwork packet was sent with patient to give to [facility] upon arrival to facility. [facility] was called for transport. Patient was stable and had discharge packet. Patient left via [facility] for transport back to [facility] facility. Patient's spouse aware that patient going back to [facility].

## 2024-02-01 NOTE — Progress Notes (Signed)
 CT head reviewed, very minimal IVH which is unchanged on his repeat head CT. New head CT suggests maybe a SDH on the left, 3mm? No surgery is necessary at this point. Patient stable to discharge from neurosurgery standpoint

## 2024-02-01 NOTE — H&P (Signed)
 History and Physical    Jeffrey Zimmerman FMW:[id number] DOB: 07/21/1935 DOA: 01/31/2024  PCP: Cloria Annabella CROME, DO   Patient coming from: SNF  Chief Complaint: Fall with small head laceration   HPI: Jeffrey Zimmerman is a 88 y.o. male with medical history significant for hypertension, COPD, CAD, Alzheimer dementia, Bell's palsy, BPH, depression, and anxiety who presents with a small scalp laceration after a fall.  Patient reportedly fell from his wheelchair at his SNF, was noted to have a laceration at the posterior aspect of his head, and was sent to the ED for evaluation.  Patient is not able to provide a detailed history but states that he tripped and fell.  He denies any pain at this time.    ED Course: Upon arrival to the ED, patient is found to be afebrile and saturating mid 90s on room air with normal HR and stable BP.  Head CT demonstrates minimal isolated intraventricular hemorrhage in the left lateral ventricle.  Neurosurgery (Dr. Joshua) was consulted by the ED physician and recommended repeating head CT at 5 AM.  Tdap was administered, basic labs were ordered, and scalp laceration was approximated with 1 staple in the ED.  Review of Systems:  ROS limited by patient's clinical condition.  Past Medical History:  Diagnosis Date   Adenomatous colon polyp 08/2008   Arthritis    Basal cell carcinoma    Cancer (HCC)    colon   COPD (chronic obstructive pulmonary disease) (HCC) 02/01/2024   Coronary artery disease    04/21/10 Anter MI with BMS to LAD with residual proximal LAD EF 40%   Gout    History of cholecystectomy    History of colonoscopy    1/5 AND 2/10   Hypertension    Irritable bowel syndrome    Myocardial infarct [facility])    SVT (supraventricular tachycardia) (HCC)     Past Surgical History:  Procedure Laterality Date   ADJACENT TISSUE TRANSFER/TISSUE REARRANGEMENT Right 06/23/2021   Procedure: ADJACENT TISSUE TRANSFER/TISSUE REARRANGEMENT;  Surgeon: Elisabeth Craig RAMAN,  MD;  Location: [facility] OR;  Service: Plastics;  Laterality: Right;   APPENDECTOMY     CARDIAC CATHETERIZATION     04/21/10 Anter MI with BMS to LAD with residual proximal LAD EF 40%   CHOLECYSTECTOMY     pt does not remember when   DEBRIDEMENT AND CLOSURE WOUND Right 06/23/2021   Procedure: Right cheek reconstruction;  Surgeon: Elisabeth Craig RAMAN, MD;  Location: [facility] OR;  Service: Plastics;  Laterality: Right;   KNEE ARTHOSCOPY Bilateral    PAROTIDECTOMY N/A 06/23/2021   Procedure: PAROTIDECTOMY  RIGHT SUPERFICIAL WITH FACIAL NERVE DISSECTION;  Surgeon: Jesus Oliphant, MD;  Location: [facility] OR;  Service: ENT;  Laterality: N/A;   PARTIAL LEFT NEPHRECTOMY     (for oncocytoma-benign)    Social History:   reports that he has never smoked. He has never used smokeless tobacco. He reports that he does not drink alcohol  and does not use drugs.  Allergies  Allergen Reactions   Codeine Other (See Comments)    Doesn't help with with pain control    Family History  Problem Relation Age of Onset   Heart attack Mother    CAD Mother    Colon cancer Father    Rheum arthritis Maternal Grandfather      Prior to Admission medications   Medication Sig Start Date End Date Taking? Authorizing Provider  amLODipine  (NORVASC ) 5 MG tablet Take 5 mg by mouth daily.  Yes [provider]  divalproex  (DEPAKOTE ) 125 MG DR tablet Take 125 mg by mouth 2 (two) times daily.   Yes [provider]  acetaminophen  (TYLENOL ) 650 MG CR tablet Take 650 mg by mouth every 8 (eight) hours as needed (osteoarthritis pain).    [provider]  albuterol  (VENTOLIN  HFA) 108 (90 Base) MCG/ACT inhaler Inhale 2 puffs into the lungs See admin instructions. Inhale 2 puffs every 6-8 hours as needed for COPD    [provider]  antiseptic oral rinse (BIOTENE) LIQD 15 mLs by Mouth Rinse route in the morning, at noon, and at bedtime. Before meals. Swish and spit    [provider]  citalopram  (CELEXA ) 20  MG tablet Take 20 mg by mouth daily.    [provider]  dexamethasone  (DECADRON ) 6 MG tablet Take 1 tablet (6 mg total) by mouth daily. 03/13/22   Cheryle Page, MD  Dextran 70-Hypromellose 0.1-0.3 % SOLN Place 1 drop into the right eye every 6 (six) hours as needed (dry eye).    [provider]  finasteride  (PROSCAR ) 5 MG tablet Take 5 mg by mouth every morning.    [provider]  lactase (LACTAID) 3000 units tablet Take 3,000 Units by mouth 3 (three) times daily with meals.    [provider]  LACTULOSE PO Take 30 mLs by mouth See admin instructions. Take 30 ml by mouth once daily on Monday, Wednesday, and Friday    [provider]  lisinopril  (ZESTRIL ) 20 MG tablet Take 1 tablet (20 mg total) by mouth daily. 07/11/21   Gonfa, Taye T, MD  nystatin  ointment (MYCOSTATIN ) Apply 1 Application topically See admin instructions. Apply to chest and back every shift on Monday, Wednesday, and Friday for 3 weeks ending on 03/17/22 03/13/22   Cheryle Page, MD  rosuvastatin  (CRESTOR ) 20 MG tablet Take 1 tablet (20 mg total) by mouth daily. 12/31/21   Dann Candyce RAMAN, MD  selenium sulfide (SELSUN BLUE) 1 % LOTN Apply 1 application  topically 3 (three) times a week. Apply to scalp/hair on Monday, Wednesday, and Friday.    [provider]  Sennosides-Docusate Sodium  (SENNA PLUS) 8.6-50 MG CAPS Take 2 capsules by mouth in the morning and at bedtime. Hold for Diarrhea    [provider]  silodosin (RAPAFLO) 8 MG CAPS capsule Take 8 mg by mouth daily.    [provider]  Tetrahydrozoline HCl (VISINE RED EYE COMFORT OP) Place 2 drops into the right eye in the morning and at bedtime.    [provider]  Tiotropium Bromide  Monohydrate (SPIRIVA  RESPIMAT) 1.25 MCG/ACT AERS Inhale 2 each into the lungs daily.    [provider]  White Petrolatum -Mineral Oil (TEARS NATURALE PM) 3-94 % OINT Place 1 Application into the right eye at  bedtime.    [provider]    Physical Exam: Vitals:   01/31/24 1819 01/31/24 2002 01/31/24 2348  BP:  (!) 155/80 (!) 141/95  Pulse:  66 86  Resp:  17 16  Temp:  98.5 F (36.9 C) 98.7 F (37.1 C)  TempSrc:  Oral Oral  SpO2:  95% 96%  Weight: 74.8 kg    Height: 5' 11 (1.803 m)      Constitutional: NAD, sleeping   Eyes: PERTLA, lids and conjunctivae normal ENMT: Mucous membranes are moist. Posterior pharynx clear of any exudate or lesions.   Neck: supple, no masses  Respiratory:  no wheezing, no crackles. No accessory muscle use.  Cardiovascular:  S1 & S2 heard, regular rate and rhythm. No extremity edema.  Abdomen: No tenderness, soft. Bowel sounds active.  Musculoskeletal: no clubbing / cyanosis. No joint deformity upper and lower extremities.   Skin: no significant rashes, lesions, ulcers. Scalp laceration approximated with staple. Neurologic: Wakes to voice and is oriented to person only. Moving all extremities. PERRL. Has difficulty following directions.   Psychiatric: Calm. Cooperative.    Labs and Imaging on Admission: I have personally reviewed following labs and imaging studies  CBC: No results for input(s): WBC, NEUTROABS, HGB, HCT, MCV, PLT in the last 168 hours. Basic Metabolic Panel: No results for input(s): NA, K, CL, CO2, GLUCOSE, BUN, CREATININE, CALCIUM , MG, PHOS in the last 168 hours. GFR: CrCl cannot be calculated (Patient's most recent lab result is older than the maximum 21 days allowed.). Liver Function Tests: No results for input(s): AST, ALT, ALKPHOS, BILITOT, PROT, ALBUMIN in the last 168 hours. No results for input(s): LIPASE, AMYLASE in the last 168 hours. No results for input(s): AMMONIA in the last 168 hours. Coagulation Profile: No results for input(s): INR, PROTIME in the last 168 hours. Cardiac Enzymes: No results for input(s): CKTOTAL, CKMB, CKMBINDEX, TROPONINI in the  last 168 hours. BNP (last 3 results) No results for input(s): PROBNP in the last 8760 hours. HbA1C: No results for input(s): HGBA1C in the last 72 hours. CBG: No results for input(s): GLUCAP in the last 168 hours. Lipid Profile: No results for input(s): CHOL, HDL, LDLCALC, TRIG, CHOLHDL, LDLDIRECT in the last 72 hours. Thyroid Function Tests: No results for input(s): TSH, T4TOTAL, FREET4, T3FREE, THYROIDAB in the last 72 hours. Anemia Panel: No results for input(s): VITAMINB12, FOLATE, FERRITIN, TIBC, IRON, RETICCTPCT in the last 72 hours. Urine analysis:    Component Value Date/Time   COLORURINE YELLOW 12/26/2022 0416   APPEARANCEUR CLEAR 12/26/2022 0416   LABSPEC 1.013 12/26/2022 0416   PHURINE 6.0 12/26/2022 0416   GLUCOSEU NEGATIVE 12/26/2022 0416   HGBUR NEGATIVE 12/26/2022 0416   BILIRUBINUR NEGATIVE 12/26/2022 0416   KETONESUR NEGATIVE 12/26/2022 0416   PROTEINUR NEGATIVE 12/26/2022 0416   NITRITE NEGATIVE 12/26/2022 0416   LEUKOCYTESUR NEGATIVE 12/26/2022 0416   Sepsis Labs: @LABRCNTIP (procalcitonin:4,lacticidven:4) )No results found for this or any previous visit (from the past 240 hours).   Radiological Exams on Admission: CT Head Wo Contrast Result Date: 01/31/2024 CLINICAL DATA:  Recent fall from wheelchair with headaches and neck pain, initial encounter EXAM: CT HEAD WITHOUT CONTRAST TECHNIQUE: Contiguous axial images were obtained from the base of the skull through the vertex without intravenous contrast. RADIATION DOSE REDUCTION: This exam was performed according to the departmental dose-optimization program which includes automated exposure control, adjustment of the mA and/or kV according to patient size and/or use of iterative reconstruction technique. COMPARISON:  12/26/2022 FINDINGS: Brain: Ventricles are again prominent in size. Mild atrophic changes are noted. A minimal amount of increased density is noted in the  dependent portion of the occipital horn of the left lateral ventricle. This is best visualized on image number 20 of series 504. This is consistent with mild interventricular hemorrhage although no parenchymal or subarachnoid hemorrhage is identified. No mass lesion is noted. No acute infarct is seen. Vascular: No hyperdense vessel or unexpected calcification. Skull: Normal. Negative for fracture or focal lesion. Sinuses/Orbits: No acute finding. Other: None. IMPRESSION: Minimal isolated intraventricular hemorrhage in the left lateral ventricle as described. No associated hemorrhage is noted. Follow-up examination is recommended. Critical Value/emergent results were called by telephone at the time  of interpretation on 01/31/2024 at 10:31 pm to Dr. LAVONIA PAT , who verbally acknowledged these results. Electronically Signed   By: Oneil Devonshire M.D.   On: 01/31/2024 22:33   CT Cervical Spine Wo Contrast Result Date: 01/31/2024 CLINICAL DATA:  Fall, evaluation for fracture. Posterior head laceration. EXAM: CT CERVICAL SPINE WITHOUT CONTRAST TECHNIQUE: Multidetector CT imaging of the cervical spine was performed without intravenous contrast. Multiplanar CT image reconstructions were also generated. RADIATION DOSE REDUCTION: This exam was performed according to the departmental dose-optimization program which includes automated exposure control, adjustment of the mA and/or kV according to patient size and/or use of iterative reconstruction technique. COMPARISON:  CT 10/27/2022 FINDINGS: Alignment: No evidence of traumatic listhesis. Skull base and vertebrae: No acute fracture. Soft tissues and spinal canal: No prevertebral fluid or swelling. No visible canal hematoma. Disc levels: Moderate to advanced spondylosis and disc space height loss throughout the cervical spine similar to 10/27/2022. Widespread facet arthrosis. No severe spinal canal narrowing. Upper chest: No acute abnormality. Other: Carotid calcification.  IMPRESSION: No acute fracture in the cervical spine Electronically Signed   By: Norman Gatlin M.D.   On: 01/31/2024 22:27    Assessment/Plan  1. Intraventricular hemorrhage  - Neuro checks, repeat head CT at 5 AM or sooner if clinically indicated   2. Dementia  - Use delirium precautions, continue Depakote     3. Hypertension  - Continue Norvasc     4. COPD  - Not in exacerbation  - Continue albuterol  as-needed   5. Scalp laceration  - 1 staple placed in ED, will need to be evaluated for staple removal in 7-10 days    DVT prophylaxis: SCDs Code Status: DNR/DNI, paperwork at bedside  Level of Care: Level of care: Telemetry Family Communication: none present  Disposition Plan:  Patient is from: SNF  Anticipated d/c is to: SNF  Anticipated d/c date is: 02/01/24  Patient currently: Pending clinical stability, repeat head CT  Consults called: ED physician discussed with neurosurgery  Admission status: Observation     Evalene GORMAN Sprinkles, MD [facility]  02/01/2024, 12:21 AM

## 2024-02-01 NOTE — ED Notes (Signed)
 Patient transported to CT

## 2024-02-01 NOTE — ED Notes (Addendum)
 Attempted IV and blood draw after haldol  admin. Pt still uncooperative. Able to establish access for lab work but not for IV access. Provider made aware/

## 2024-02-01 NOTE — Discharge Summary (Signed)
 Physician Discharge Summary   Patient: Jeffrey Zimmerman MRN: [id number] DOB: 05/22/1936  Admit date:     01/31/2024  Discharge date: 02/01/24  Discharge Physician: Elgie Butter   PCP: Cloria Annabella CROME, DO   Recommendations at discharge:  Please follow up with PCP in one week.  Please remove the staple at the back of the head in 1 to 2 weeks.   Discharge Diagnoses: Principal Problem:   Intraventricular hemorrhage (HCC) Active Problems:   Alzheimer's dementia with behavioral disturbance (HCC)   COPD (chronic obstructive pulmonary disease) [facility])   Hospital Course: Jeffrey Zimmerman is a 88 y.o. male with medical history significant for hypertension, COPD, CAD, Alzheimer dementia, Bell's palsy, BPH, depression, and anxiety who presents with a small scalp laceration after a fall.   Patient reportedly fell from his wheelchair at his SNF, was noted to have a laceration at the posterior aspect of his head, and was sent to the ED for evaluation.   Neurosurgery consulted. Recommended no surgical intervention indicated at this time.   Assessment and Plan:      Fall at SNF Ct showing IVH.  Repeat CT shows a 3mm sub dural hematoma without mass effect or shift.  NS consulted, suggested that no surgical intervention indicated at this time.  Therapy eval ordered.  D./c to SNF when bed available.      Hypertension Sub optimally controlled.  Recheck BP and if still elevated , will increase norvasc  to 10 mg daily.      COPD No wheezing heard.      Alzheimer Dementia No agitation. Stable.        Anxiety and depression Resume home medications.       Consultants: NS Procedures performed: none.   Disposition: Skilled nursing facility Diet recommendation:  Discharge Diet Orders (From admission, onward)     Start     Ordered   02/01/24 0000  Diet - low sodium heart healthy        02/01/24 1438           Regular diet DISCHARGE MEDICATION: Allergies as of 02/01/2024        Reactions   Codeine Other (See Comments)   Doesn't help with with pain control        Medication List     TAKE these medications    acetaminophen  650 MG CR tablet Commonly known as: TYLENOL  Take 650 mg by mouth every 8 (eight) hours as needed (osteoarthritis pain).   albuterol  108 (90 Base) MCG/ACT inhaler Commonly known as: VENTOLIN  HFA Inhale 2 puffs into the lungs See admin instructions. Inhale 2 puffs every 6-8 hours as needed for COPD   amLODipine  5 MG tablet Commonly known as: NORVASC  Take 5 mg by mouth daily.   antiseptic oral rinse Liqd 15 mLs by Mouth Rinse route in the morning, at noon, and at bedtime. Before meals. Swish and spit   citalopram  40 MG tablet Commonly known as: CELEXA  Take 40 mg by mouth daily.   divalproex  125 MG DR tablet Commonly known as: DEPAKOTE  Take 125 mg by mouth 2 (two) times daily.   Selsun Blue 1 % Lotn Generic drug: selenium sulfide Apply 1 application  topically 3 (three) times a week. Apply to scalp/hair on Monday, Wednesday, and Friday.   Senna Plus 50-8.6 MG Caps Generic drug: Sennosides-Docusate Sodium  Take 2 capsules by mouth in the morning and at bedtime. Hold for Diarrhea   Tears Naturale PM 3-94 % Oint Place 1 Application into the  right eye at bedtime.        Follow-up Information     Reed, Tiffany L, DO. Schedule an appointment as soon as possible for a visit in 1 week(s).   Specialty: Geriatric Medicine Contact information: [address] [place] [address] [phone]                Discharge Exam: Filed Weights   01/31/24 1819  Weight: 74.8 kg   General exam: Appears calm and comfortable  Respiratory system: Clear to auscultation. Respiratory effort normal. Cardiovascular system: S1 & S2 heard, RRR. No JVD,  Gastrointestinal system: Abdomen is nondistended, soft and nontender.  Central nervous system: Alert and oriented to person Extremities: Symmetric 5 x 5 power. Skin: No rashes,  laceration on the back of the head stapled.  Psychiatry: . Mood & affect appropriate.    Condition at discharge: fair  The results of significant diagnostics from this hospitalization (including imaging, microbiology, ancillary and laboratory) are listed below for reference.   Imaging Studies: CT HEAD WO CONTRAST ( ) Result Date: 02/01/2024 CLINICAL DATA:  88 year old male status post fall with intraventricular hemorrhage yesterday. Subsequent encounter. EXAM: CT HEAD WITHOUT CONTRAST TECHNIQUE: Contiguous axial images were obtained from the base of the skull through the vertex without intravenous contrast. RADIATION DOSE REDUCTION: This exam was performed according to the departmental dose-optimization program which includes automated exposure control, adjustment of the mA and/or kV according to patient size and/or use of iterative reconstruction technique. COMPARISON:  Head CT 2154 hours yesterday. FINDINGS: Study is intermittently degraded by motion artifact despite repeated imaging attempts. Brain: Mild motion artifact today despite repeated imaging attempts. Trace IVH in the occipital horns has not changed on series 2, image 20. Stable ventricle size and configuration. Now difficult to exclude a small hyperdense left side subdural hematoma on series 7, image 5, 3-4 mm. But no intracranial mass effect or midline shift. Basilar cisterns remain normal. Stable gray-white matter differentiation throughout the brain. No cortically based acute infarct identified. Vascular: Stable when allowing for motion. Skull: Skull base motion artifact. No acute osseous abnormality identified. Sinuses/Orbits: Visualized paranasal sinuses and mastoids are stable and well aerated. Other: Right posterior vertex scalp hematoma is larger on series 4, image 47. nearby scalp skin staple now in place. IMPRESSION: 1. Motion degraded exam despite repeat imaging attempts. Now difficult to exclude a small 3-4 mm left superior  convexity subdural hematoma (series 7, image 5). Stable trace IVH in the occipital horns. 2. No intracranial mass effect or midline shift. 3. Right posterior vertex scalp hematoma is larger, with nearby skin stable. No skull fracture identified. Electronically Signed   By: VEAR Hurst M.D.   On: 02/01/2024 06:33   CT Head Wo Contrast Result Date: 01/31/2024 CLINICAL DATA:  Recent fall from wheelchair with headaches and neck pain, initial encounter EXAM: CT HEAD WITHOUT CONTRAST TECHNIQUE: Contiguous axial images were obtained from the base of the skull through the vertex without intravenous contrast. RADIATION DOSE REDUCTION: This exam was performed according to the departmental dose-optimization program which includes automated exposure control, adjustment of the mA and/or kV according to patient size and/or use of iterative reconstruction technique. COMPARISON:  12/26/2022 FINDINGS: Brain: Ventricles are again prominent in size. Mild atrophic changes are noted. A minimal amount of increased density is noted in the dependent portion of the occipital horn of the left lateral ventricle. This is best visualized on image number 20 of series 504. This is consistent with mild interventricular hemorrhage although  no parenchymal or subarachnoid hemorrhage is identified. No mass lesion is noted. No acute infarct is seen. Vascular: No hyperdense vessel or unexpected calcification. Skull: Normal. Negative for fracture or focal lesion. Sinuses/Orbits: No acute finding. Other: None. IMPRESSION: Minimal isolated intraventricular hemorrhage in the left lateral ventricle as described. No associated hemorrhage is noted. Follow-up examination is recommended. Critical Value/emergent results were called by telephone at the time of interpretation on 01/31/2024 at 10:31 pm to Dr. LAVONIA PAT , who verbally acknowledged these results. Electronically Signed   By: Oneil Devonshire M.D.   On: 01/31/2024 22:33   CT Cervical Spine Wo  Contrast Result Date: 01/31/2024 CLINICAL DATA:  Fall, evaluation for fracture. Posterior head laceration. EXAM: CT CERVICAL SPINE WITHOUT CONTRAST TECHNIQUE: Multidetector CT imaging of the cervical spine was performed without intravenous contrast. Multiplanar CT image reconstructions were also generated. RADIATION DOSE REDUCTION: This exam was performed according to the departmental dose-optimization program which includes automated exposure control, adjustment of the mA and/or kV according to patient size and/or use of iterative reconstruction technique. COMPARISON:  CT 10/27/2022 FINDINGS: Alignment: No evidence of traumatic listhesis. Skull base and vertebrae: No acute fracture. Soft tissues and spinal canal: No prevertebral fluid or swelling. No visible canal hematoma. Disc levels: Moderate to advanced spondylosis and disc space height loss throughout the cervical spine similar to 10/27/2022. Widespread facet arthrosis. No severe spinal canal narrowing. Upper chest: No acute abnormality. Other: Carotid calcification. IMPRESSION: No acute fracture in the cervical spine Electronically Signed   By: Norman Gatlin M.D.   On: 01/31/2024 22:27    Microbiology: Results for orders placed or performed during the hospital encounter of 03/07/22  Blood Culture (routine x 2)     Status: None   Collection Time: 03/07/22 11:10 AM   Specimen: BLOOD  Result Value Ref Range Status   Specimen Description   Final    BLOOD SITE NOT SPECIFIED Performed at [facility], [address], [place], [place] [address]    Special Requests   Final    BOTTLES DRAWN AEROBIC AND ANAEROBIC Blood Culture adequate volume Performed at [facility], [address], [place], [place] [address]    Culture   Final    NO GROWTH 5 DAYS Performed at [facility], [address] [place], [place] [address]    Report Status 03/12/2022 FINAL  Final  Resp Panel by RT-PCR (Flu A&B, Covid)  Anterior Nasal Swab     Status: Abnormal   Collection Time: 03/07/22 11:30 AM   Specimen: Anterior Nasal Swab  Result Value Ref Range Status   SARS Coronavirus 2 by RT PCR POSITIVE (A) NEGATIVE Final    Comment: CRITICAL RESULT CALLED TO, READ BACK BY AND VERIFIED WITH: PROGO,A AT 1613 ON 03/07/22 BY LUZOLOP (NOTE) SARS-CoV-2 target nucleic acids are DETECTED.  The SARS-CoV-2 RNA is generally detectable in upper respiratory specimens during the acute phase of infection. Positive results are indicative of the presence of the identified virus, but do not rule out bacterial infection or co-infection with other pathogens not detected by the test. Clinical correlation with patient history and other diagnostic information is necessary to determine patient infection status. The expected result is Negative.  Fact Sheet for Patients: BloggerCourse.com  Fact Sheet for Healthcare Providers: SeriousBroker.it  This test is not yet approved or cleared by the [place]  FDA and  has been authorized for detection and/or diagnosis of SARS-CoV-2 by FDA under an Emergency Use Authorization (EUA).  This EUA will remain in effect (meaning thi s test can be used) for the duration of  the COVID-19 declaration under Section 564(b)(1) of the Act, 21 U.S.C. section 360bbb-3(b)(1), unless the authorization is terminated or revoked sooner.     Influenza A by PCR NEGATIVE NEGATIVE Final   Influenza B by PCR NEGATIVE NEGATIVE Final    Comment: (NOTE) The Xpert Xpress SARS-CoV-2/FLU/RSV plus assay is intended as an aid in the diagnosis of influenza from Nasopharyngeal swab specimens and should not be used as a sole basis for treatment. Nasal washings and aspirates are unacceptable for Xpert Xpress SARS-CoV-2/FLU/RSV testing.  Fact Sheet for Patients: BloggerCourse.com  Fact Sheet for Healthcare  Providers: SeriousBroker.it  This test is not yet approved or cleared by the [place]  FDA and has been authorized for detection and/or diagnosis of SARS-CoV-2 by FDA under an Emergency Use Authorization (EUA). This EUA will remain in effect (meaning this test can be used) for the duration of the COVID-19 declaration under Section 564(b)(1) of the Act, 21 U.S.C. section 360bbb-3(b)(1), unless the authorization is terminated or revoked.  Performed at [facility], [address], [place], [place] [address]     Labs: CBC: Recent Labs  Lab 02/01/24 0314  WBC 7.1  NEUTROABS 4.8  HGB 15.2  HCT 45.5  MCV 90.8  PLT 144*   Basic Metabolic Panel: Recent Labs  Lab 02/01/24 0314  NA 138  K 3.9  CL 97*  CO2 28  GLUCOSE 107*  BUN 13  CREATININE 0.97  CALCIUM  9.2   Liver Function Tests: Recent Labs  Lab 02/01/24 0314  AST 31  ALT 20  ALKPHOS 77  BILITOT 1.2  PROT 7.0  ALBUMIN 4.0   CBG: No results for input(s): GLUCAP in the last 168 hours.  Discharge time spent: 40 minutes.   Signed: Elgie Butter, MD [facility] 02/01/2024

## 2024-02-01 NOTE — TOC Transition Note (Signed)
 Transition of Care [facility]) - Discharge Note   Patient Details  Name: Jeffrey Zimmerman MRN: [id number] Date of Birth: 20-Nov-1935  [facility]) CM/SW Contact:  [name], LCSW Phone Number: 02/01/2024, 3:34 PM   Clinical Narrative:     Pt to d/c back to [facility] facility. CSW spoke with pt's son who agrees with pt's discharge plan. CSW arranged [facility] to provided transport back to facility. Care Management sign off.   Final next level of care: Long Term Nursing Home Barriers to Discharge: Barriers Resolved   Patient Goals and CMS Choice Patient states their goals for this hospitalization and ongoing recovery are:: return back to [facility]          Discharge Placement                  Name of family member notified: [name] (Son)  [phone] (Mobile Patient and family notified of of transfer: 02/01/24  Discharge Plan and Services Additional resources added to the After Visit Summary for                                       Social Drivers of Health (SDOH) Interventions SDOH Screenings   Social Connections: Patient Unable To Answer (02/01/2024)  Tobacco Use: Low Risk  (02/01/2024)     Readmission Risk Interventions    03/10/2022   10:01 AM 08/04/2021    3:55 PM  Readmission Risk Prevention Plan  Transportation Screening Complete Complete  PCP or Specialist Appt within 3-5 Days Complete   HRI or Home Care Consult Complete   Social Work Consult for Recovery Care Planning/Counseling Complete   Palliative Care Screening Not Applicable   Medication Review [facility]) Complete Complete  PCP or Specialist appointment within 3-5 days of discharge  Not Complete  PCP/Specialist Appt Not Complete comments  [place] not medically ready for discharge.  HRI or Home Care Consult  Complete  SW Recovery Care/Counseling Consult  Complete  Palliative Care Screening  Not Applicable  Skilled Nursing Facility  Not  Applicable

## 2024-02-01 NOTE — Progress Notes (Signed)
 [facility]                                                                               Jeffrey Zimmerman, is a 88 y.o. male, DOB - [birth year], FMW:[id number] Admit date - 01/31/2024    Outpatient Primary MD for the patient is Cloria, Jeffrey L, DO  LOS - 0  days    Brief summary   Jeffrey Zimmerman is a 88 y.o. male with medical history significant for hypertension, COPD, CAD, Alzheimer dementia, Bell's palsy, BPH, depression, and anxiety who presents with a small scalp laceration after a fall.   Patient reportedly fell from his wheelchair at his SNF, was noted to have a laceration at the posterior aspect of his head, and was sent to the ED for evaluation.  Neurosurgery consulted. Recommended no surgical intervention indicated at this time.    Assessment & Plan    Assessment and Plan:    Fall at SNF Ct showing IVH.  Repeat CT shows a 3mm sub dural hematoma without mass effect or shift.  NS consulted, suggested that no surgical intervention indicated at this time.  Therapy eval ordered.  D./c to SNF when bed available.    Hypertension Sub optimally controlled.  Recheck BP and if still elevated , will increase norvasc  to 10 mg daily.    COPD No wheezing heard.    Alzheimer Dementia No agitation. Stable.     Anxiety and depression Resume home medications.    Estimated body mass index is 23.01 kg/m as calculated from the following:   Height as of this encounter: 5' 11 (1.803 m).   Weight as of this encounter: 74.8 kg.  Code Status: DNR limited DVT Prophylaxis:  SCDs Start: 02/01/24 0020   Level of Care: Level of care: Telemetry Family Communication: none at bedside.   Disposition Plan:     Remains inpatient appropriate:  discharge back to SNF when bed available.   Procedures:  None.   Consultants:   NS  Antimicrobials:   Anti-infectives (From admission, onward)    None        Medications  Scheduled Meds:  amLODipine   5 mg Oral  Daily   [START ON 02/02/2024] citalopram   40 mg Oral Daily   divalproex   125 mg Oral BID   sodium chloride  flush  3 mL Intravenous Q12H   Continuous Infusions: PRN Meds:.acetaminophen  **OR** acetaminophen , albuterol , fentaNYL  (SUBLIMAZE ) injection, ondansetron  **OR** ondansetron  (ZOFRAN ) IV, oxyCODONE , senna-docusate    Subjective:   Jeffrey Zimmerman was seen and examined today.    Objective:   Vitals:   02/01/24 0626 02/01/24 0657 02/01/24 1025 02/01/24 1311  BP: (!) 155/89 (!) 152/92 (!) 151/83 (!) 178/98  Pulse: 81 94 78 89  Resp: (!) 25 17 (!) 21 18  Temp: (!) 97.4 F (36.3 C) 97.7 F (36.5 C) 98.4 F (36.9 C) 97.7 F (36.5 C)  TempSrc: Oral Oral Oral   SpO2: 96% 99% 100% 97%  Weight:      Height:        Intake/Output Summary (Last 24 hours) at 02/01/2024 1338 Last data filed at 02/01/2024 1114 Gross per 24 hour  Intake 123 ml  Output --  Net 123 ml   Filed Weights   01/31/24 1819  Weight: 74.8 kg     Exam General exam: Appears calm and comfortable  Respiratory system: Clear to auscultation. Respiratory effort normal. Cardiovascular system: S1 & S2 heard, RRR.  Gastrointestinal system: Abdomen is nondistended, soft and nontender.  Central nervous system: Alert and oriented to person only.  Extremities: no pedal edema.  Skin: No rashes,  Psychiatry: flat affect.    Data Reviewed:  I have personally reviewed following labs and imaging studies   CBC Lab Results  Component Value Date   WBC 7.1 02/01/2024   RBC 5.01 02/01/2024   HGB 15.2 02/01/2024   HCT 45.5 02/01/2024   MCV 90.8 02/01/2024   MCH 30.3 02/01/2024   PLT 144 (Zimmerman) 02/01/2024   MCHC 33.4 02/01/2024   RDW 13.8 02/01/2024   LYMPHSABS 1.6 02/01/2024   MONOABS 0.5 02/01/2024   EOSABS 0.2 02/01/2024   BASOSABS 0.0 02/01/2024     Last metabolic panel Lab Results  Component Value Date   NA 138 02/01/2024   K 3.9 02/01/2024   CL 97 (Zimmerman) 02/01/2024   CO2 28 02/01/2024   BUN 13 02/01/2024    CREATININE 0.97 02/01/2024   GLUCOSE 107 (H) 02/01/2024   GFRNONAA >60 02/01/2024   GFRAA >60 11/20/2019   CALCIUM  9.2 02/01/2024   PHOS 3.0 03/09/2022   PROT 7.0 02/01/2024   ALBUMIN 4.0 02/01/2024   LABGLOB 1.8 06/18/2021   AGRATIO 2.6 (H) 06/18/2021   BILITOT 1.2 02/01/2024   ALKPHOS 77 02/01/2024   AST 31 02/01/2024   ALT 20 02/01/2024   ANIONGAP 13 02/01/2024    CBG (last 3)  No results for input(s): GLUCAP in the last 72 hours.    Coagulation Profile: Recent Labs  Lab 02/01/24 0314  INR 1.0     Radiology Studies: CT HEAD WO CONTRAST ( ) Result Date: 02/01/2024 CLINICAL DATA:  88 year old male status post fall with intraventricular hemorrhage yesterday. Subsequent encounter. EXAM: CT HEAD WITHOUT CONTRAST TECHNIQUE: Contiguous axial images were obtained from the base of the skull through the vertex without intravenous contrast. RADIATION DOSE REDUCTION: This exam was performed according to the departmental dose-optimization program which includes automated exposure control, adjustment of the mA and/or kV according to patient size and/or use of iterative reconstruction technique. COMPARISON:  Head CT 2154 hours yesterday. FINDINGS: Study is intermittently degraded by motion artifact despite repeated imaging attempts. Brain: Mild motion artifact today despite repeated imaging attempts. Trace IVH in the occipital horns has not changed on series 2, image 20. Stable ventricle size and configuration. Now difficult to exclude a small hyperdense left side subdural hematoma on series 7, image 5, 3-4 mm. But no intracranial mass effect or midline shift. Basilar cisterns remain normal. Stable gray-white matter differentiation throughout the brain. No cortically based acute infarct identified. Vascular: Stable when allowing for motion. Skull: Skull base motion artifact. No acute osseous abnormality identified. Sinuses/Orbits: Visualized paranasal sinuses and mastoids are stable and  well aerated. Other: Right posterior vertex scalp hematoma is larger on series 4, image 47. nearby scalp skin staple now in place. IMPRESSION: 1. Motion degraded exam despite repeat imaging attempts. Now difficult to exclude a small 3-4 mm left superior convexity subdural hematoma (series 7, image 5). Stable trace IVH in the occipital horns. 2. No intracranial mass effect or midline shift. 3. Right posterior vertex scalp hematoma is larger, with nearby skin stable. No skull fracture identified. Electronically Signed   By:  VEAR Hurst M.D.   On: 02/01/2024 06:33   CT Head Wo Contrast Result Date: 01/31/2024 CLINICAL DATA:  Recent fall from wheelchair with headaches and neck pain, initial encounter EXAM: CT HEAD WITHOUT CONTRAST TECHNIQUE: Contiguous axial images were obtained from the base of the skull through the vertex without intravenous contrast. RADIATION DOSE REDUCTION: This exam was performed according to the departmental dose-optimization program which includes automated exposure control, adjustment of the mA and/or kV according to patient size and/or use of iterative reconstruction technique. COMPARISON:  12/26/2022 FINDINGS: Brain: Ventricles are again prominent in size. Mild atrophic changes are noted. A minimal amount of increased density is noted in the dependent portion of the occipital horn of the left lateral ventricle. This is best visualized on image number 20 of series 504. This is consistent with mild interventricular hemorrhage although no parenchymal or subarachnoid hemorrhage is identified. No mass lesion is noted. No acute infarct is seen. Vascular: No hyperdense vessel or unexpected calcification. Skull: Normal. Negative for fracture or focal lesion. Sinuses/Orbits: No acute finding. Other: None. IMPRESSION: Minimal isolated intraventricular hemorrhage in the left lateral ventricle as described. No associated hemorrhage is noted. Follow-up examination is recommended. Critical Value/emergent  results were called by telephone at the time of interpretation on 01/31/2024 at 10:31 pm to Dr. LAVONIA PAT , who verbally acknowledged these results. Electronically Signed   By: Oneil Devonshire M.D.   On: 01/31/2024 22:33   CT Cervical Spine Wo Contrast Result Date: 01/31/2024 CLINICAL DATA:  Fall, evaluation for fracture. Posterior head laceration. EXAM: CT CERVICAL SPINE WITHOUT CONTRAST TECHNIQUE: Multidetector CT imaging of the cervical spine was performed without intravenous contrast. Multiplanar CT image reconstructions were also generated. RADIATION DOSE REDUCTION: This exam was performed according to the departmental dose-optimization program which includes automated exposure control, adjustment of the mA and/or kV according to patient size and/or use of iterative reconstruction technique. COMPARISON:  CT 10/27/2022 FINDINGS: Alignment: No evidence of traumatic listhesis. Skull base and vertebrae: No acute fracture. Soft tissues and spinal canal: No prevertebral fluid or swelling. No visible canal hematoma. Disc levels: Moderate to advanced spondylosis and disc space height loss throughout the cervical spine similar to 10/27/2022. Widespread facet arthrosis. No severe spinal canal narrowing. Upper chest: No acute abnormality. Other: Carotid calcification. IMPRESSION: No acute fracture in the cervical spine Electronically Signed   By: Norman Gatlin M.D.   On: 01/31/2024 22:27       Elgie Butter M.D. [facility] 02/01/2024, 1:38 PM  Available via Epic secure chat 7am-7pm After 7 pm, please refer to night coverage provider listed on amion.

## 2024-02-01 NOTE — Evaluation (Signed)
 Physical Therapy Brief Evaluation and Discharge Note Patient Details Name: Jeffrey Zimmerman MRN: [id number] DOB: 1935-12-25 Today's Date: 02/01/2024   History of Present Illness  88 yo male presents to therapy following hospital admission on 01/31/2024 due to fall from wc at [facility]. Pt sustained scalp laceration to posterior aspect of head requiring one staple and head CT revealed 3 mm L sided subdural hematoma and blood in the occipital horns, neurosurgery indicated no intervention warranted. Pt PMH includes but is not limited to: colon ca, COPD, CAD, gout, HTN, IBS, MI, SVT, parotidectomy of R with facial nerve dissection and partial L nephrectomy.  Clinical Impression   Patient evaluated by Physical Therapy with no further acute PT needs identified. All education has been completed and the patient has no further questions.  See below for any follow-up Physical Therapy or DME needs. PT is signing off. Thank you for this referral.        PT Assessment    Assistance Needed at Discharge       Equipment Recommendations None recommended by PT  Recommendations for Other Services       Precautions/Restrictions Precautions Precautions: Fall Restrictions Weight Bearing Restrictions Per Provider Order: No        Mobility  Bed Mobility Rolling: Min assist        Transfers                   General transfer comment: NT, pt exhibiting perceived behaviors including but not limited to attempting to strike, bite, kick and scratch therapist and nurse techs in room    Ambulation/Gait           General Gait Details: NT  Home Activity Instructions    Stairs            Modified Rankin (Stroke Patients Only)        Balance                          Pertinent Vitals/Pain   Pain Assessment Pain Assessment: Faces Faces Pain Scale: No hurt (no apparent pain response)     Home Living               Additional Comments: [facility]     Prior Function        UE/LE Assessment               Communication   Communication Communication: Impaired Factors Affecting Communication: Reduced clarity of speech;Difficulty expressing self     Cognition         General Comments      Exercises     Assessment/Plan    PT Problem List Decreased activity tolerance;Decreased balance;Decreased mobility;Decreased coordination;Decreased cognition       PT Visit Diagnosis History of falling (Z91.81);Other abnormalities of gait and mobility (R26.89)    No Skilled PT     Co-evaluation                AMPAC 6 Clicks Help needed turning from your back to your side while in a flat bed without using bedrails?: A Little Help needed moving from lying on your back to sitting on the side of a flat bed without using bedrails?: A Little Help needed moving to and from a bed to a chair (including a wheelchair)?: Total Help needed standing up from a chair using your arms (e.g., wheelchair or bedside chair)?: Total Help needed to walk in hospital room?: Total  Help needed climbing 3-5 steps with a railing? : Total 6 Click Score: 10      End of Session   Activity Tolerance: Treatment limited secondary to agitation Patient left: in bed;with call bell/phone within reach;with bed alarm set Nurse Communication: Mobility status;Other (comment) (Perceived behaviors) PT Visit Diagnosis: History of falling (Z91.81);Other abnormalities of gait and mobility (R26.89)     Time: 1100-1114 PT Time Calculation (min) (ACUTE ONLY): 14 min  Charges:   PT Evaluation $PT Eval Low Complexity: 1 Low      [place], PT Acute Rehab   [place] VE[facility]one  02/01/2024, 11:28 AM

## 2024-02-01 NOTE — Progress Notes (Signed)
 Patient ID: Jeffrey Zimmerman, male   DOB: [birth year], 88 y.o.   MRN: [id number] Head CT reviewed.  History reviewed.  Tiny amount of blood in the occipital horns and potentially a tiny left-sided 3 mm subdural hematoma without mass effect or shift.  No surgical intervention indicated.  Safe for discharge to skilled nursing facility from our standpoint.  Please call if we can help in any way.
